# Patient Record
Sex: Female | Born: 1955 | Race: White | Hispanic: No | Marital: Married | Smoking: Current some day smoker
Health system: Southern US, Community
[De-identification: ages and names within clinical notes are randomized; demographics above are authoritative.]

## PROBLEM LIST (undated history)

## (undated) DIAGNOSIS — I639 Cerebral infarction, unspecified: Secondary | ICD-10-CM

## (undated) DIAGNOSIS — E039 Hypothyroidism, unspecified: Secondary | ICD-10-CM

## (undated) DIAGNOSIS — I472 Ventricular tachycardia, unspecified: Secondary | ICD-10-CM

## (undated) DIAGNOSIS — I10 Essential (primary) hypertension: Secondary | ICD-10-CM

## (undated) DIAGNOSIS — R9431 Abnormal electrocardiogram [ECG] [EKG]: Secondary | ICD-10-CM

## (undated) HISTORY — PX: SHOULDER ARTHROSCOPY W/ ROTATOR CUFF REPAIR: SHX2400

## (undated) HISTORY — PX: ORIF ANKLE FRACTURE: SHX5408

## (undated) HISTORY — PX: ABDOMINAL HYSTERECTOMY: SHX81

---

## 2000-03-11 ENCOUNTER — Other Ambulatory Visit: Admission: RE | Admit: 2000-03-11 | Discharge: 2000-03-11 | Payer: Self-pay | Admitting: Obstetrics and Gynecology

## 2000-03-12 ENCOUNTER — Other Ambulatory Visit: Admission: RE | Admit: 2000-03-12 | Discharge: 2000-03-12 | Payer: Self-pay | Admitting: Obstetrics and Gynecology

## 2000-04-28 ENCOUNTER — Ambulatory Visit (HOSPITAL_COMMUNITY): Admission: RE | Admit: 2000-04-28 | Discharge: 2000-04-28 | Payer: Self-pay | Admitting: Obstetrics and Gynecology

## 2000-04-28 ENCOUNTER — Encounter: Payer: Self-pay | Admitting: Obstetrics and Gynecology

## 2000-06-21 ENCOUNTER — Ambulatory Visit (HOSPITAL_BASED_OUTPATIENT_CLINIC_OR_DEPARTMENT_OTHER): Admission: RE | Admit: 2000-06-21 | Discharge: 2000-06-21 | Payer: Self-pay | Admitting: Otolaryngology

## 2001-05-11 ENCOUNTER — Observation Stay (HOSPITAL_COMMUNITY): Admission: RE | Admit: 2001-05-11 | Discharge: 2001-05-12 | Payer: Self-pay | Admitting: Obstetrics and Gynecology

## 2004-03-07 ENCOUNTER — Ambulatory Visit (HOSPITAL_COMMUNITY): Admission: RE | Admit: 2004-03-07 | Discharge: 2004-03-07 | Payer: Self-pay | Admitting: Urology

## 2006-06-27 ENCOUNTER — Emergency Department (HOSPITAL_COMMUNITY): Admission: EM | Admit: 2006-06-27 | Discharge: 2006-06-27 | Payer: Self-pay | Admitting: Emergency Medicine

## 2006-08-24 ENCOUNTER — Ambulatory Visit (HOSPITAL_COMMUNITY): Admission: RE | Admit: 2006-08-24 | Discharge: 2006-08-24 | Payer: Self-pay | Admitting: Urology

## 2006-09-30 IMAGING — CR DG ABDOMEN 1V
1 series · 1 of 1 positions shown · non-contrast
Comparison: none

HISTORY: Right renal pelvis calculus, 

ABDOMEN ONE VIEW:
Small bilateral renal calculi.
Additional larger calculus at right renal pelvis, 10 mm diameter.
No ureteral calcification.
Bones unremarkable.
Bowel gas pattern normal.

[view not recorded]
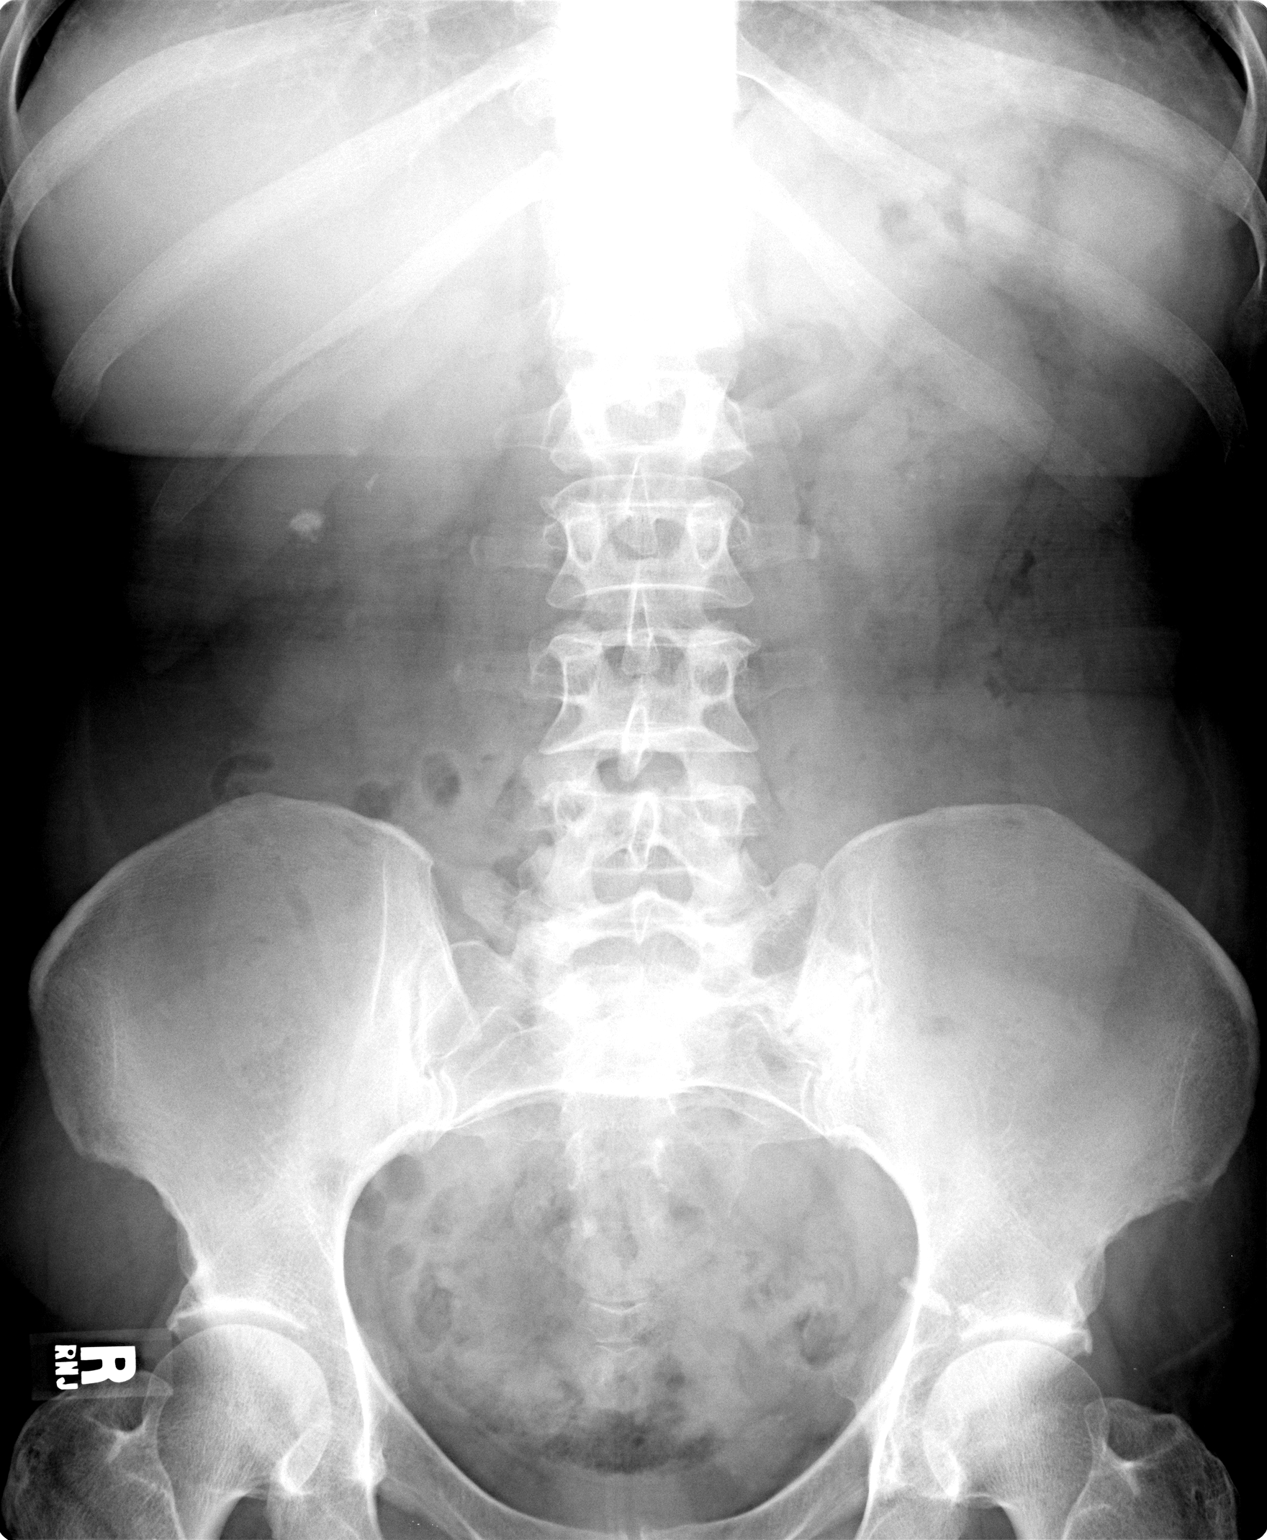

[1 of 1 positions shown; findings below may reference images not displayed]

IMPRESSION: Bilateral renal calculi, including 10 mm diameter calculus in the right kidney.

## 2009-03-18 IMAGING — CR DG ABDOMEN 1V
1 series · 1 of 1 positions shown · non-contrast
Comparison: 06/27/2006.

Examination: Abdomen, one view.

HISTORY: Left renal pelvis stone.

[t abdomen supine]
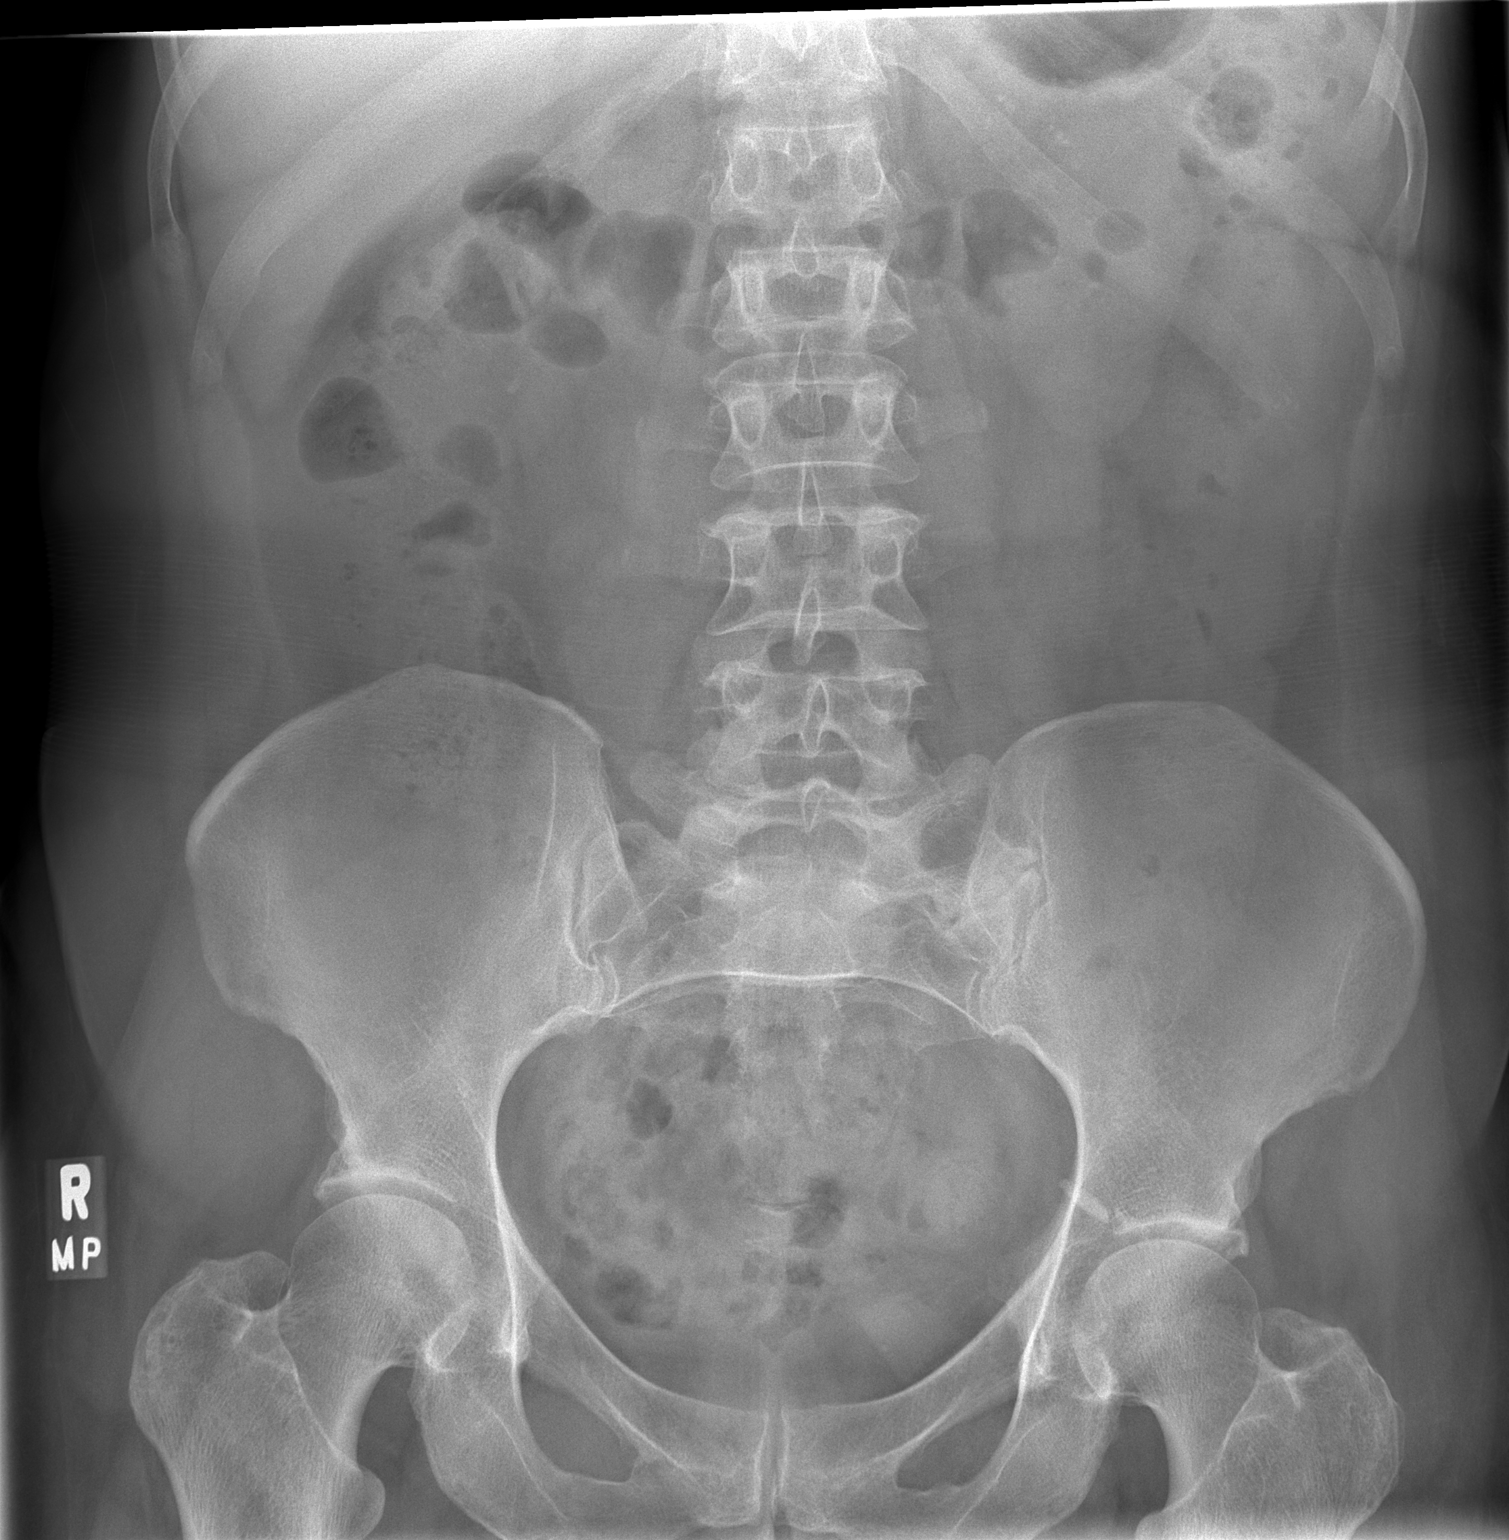

[1 of 1 positions shown; findings below may reference images not displayed]

FINDINGS: 4 mm stone is seen in the upper pole of the right kidney.

Multiple small stones are identified within the upper and lower pole collecting
systems of the left kidney.

Previously noted left proximal ureteral calculus is not seen on today's exam.

The bowel gas pattern is normal. 

No stones are seen the projection of the urinary bladder.
IMPRESSION: 1. Bilateral nephrolithiasis.

## 2011-05-13 DIAGNOSIS — C029 Malignant neoplasm of tongue, unspecified: Secondary | ICD-10-CM

## 2011-05-13 HISTORY — DX: Malignant neoplasm of tongue, unspecified: C02.9

## 2017-01-06 ENCOUNTER — Ambulatory Visit (INDEPENDENT_AMBULATORY_CARE_PROVIDER_SITE_OTHER): Payer: Self-pay | Admitting: Orthopaedic Surgery

## 2017-01-16 ENCOUNTER — Ambulatory Visit (INDEPENDENT_AMBULATORY_CARE_PROVIDER_SITE_OTHER): Payer: Self-pay | Admitting: Orthopaedic Surgery

## 2017-01-19 ENCOUNTER — Ambulatory Visit (INDEPENDENT_AMBULATORY_CARE_PROVIDER_SITE_OTHER): Payer: Self-pay | Admitting: Orthopaedic Surgery

## 2017-01-20 ENCOUNTER — Ambulatory Visit (INDEPENDENT_AMBULATORY_CARE_PROVIDER_SITE_OTHER): Payer: Self-pay | Admitting: Orthopaedic Surgery

## 2017-01-26 ENCOUNTER — Ambulatory Visit (INDEPENDENT_AMBULATORY_CARE_PROVIDER_SITE_OTHER): Payer: Self-pay | Admitting: Orthopaedic Surgery

## 2017-10-12 DIAGNOSIS — Z01818 Encounter for other preprocedural examination: Secondary | ICD-10-CM

## 2022-12-13 ENCOUNTER — Emergency Department (HOSPITAL_COMMUNITY): Payer: Medicare Other

## 2022-12-13 ENCOUNTER — Inpatient Hospital Stay (HOSPITAL_COMMUNITY)
Admission: EM | Admit: 2022-12-13 | Discharge: 2023-01-03 | DRG: 896 | Disposition: A | Discharge status: Home Health | Payer: Medicare Other | Attending: Internal Medicine | Admitting: Internal Medicine

## 2022-12-13 DIAGNOSIS — J9602 Acute respiratory failure with hypercapnia: Secondary | ICD-10-CM | POA: Diagnosis not present

## 2022-12-13 DIAGNOSIS — I952 Hypotension due to drugs: Secondary | ICD-10-CM | POA: Diagnosis not present

## 2022-12-13 DIAGNOSIS — R54 Age-related physical debility: Secondary | ICD-10-CM | POA: Diagnosis present

## 2022-12-13 DIAGNOSIS — Z8249 Family history of ischemic heart disease and other diseases of the circulatory system: Secondary | ICD-10-CM

## 2022-12-13 DIAGNOSIS — M069 Rheumatoid arthritis, unspecified: Secondary | ICD-10-CM | POA: Diagnosis present

## 2022-12-13 DIAGNOSIS — F32A Depression, unspecified: Secondary | ICD-10-CM | POA: Diagnosis present

## 2022-12-13 DIAGNOSIS — R6521 Severe sepsis with septic shock: Secondary | ICD-10-CM | POA: Diagnosis not present

## 2022-12-13 DIAGNOSIS — F05 Delirium due to known physiological condition: Secondary | ICD-10-CM | POA: Diagnosis not present

## 2022-12-13 DIAGNOSIS — D6489 Other specified anemias: Secondary | ICD-10-CM | POA: Diagnosis present

## 2022-12-13 DIAGNOSIS — F10139 Alcohol abuse with withdrawal, unspecified: Principal | ICD-10-CM | POA: Diagnosis present

## 2022-12-13 DIAGNOSIS — J440 Chronic obstructive pulmonary disease with acute lower respiratory infection: Secondary | ICD-10-CM | POA: Diagnosis present

## 2022-12-13 DIAGNOSIS — J9601 Acute respiratory failure with hypoxia: Secondary | ICD-10-CM | POA: Diagnosis not present

## 2022-12-13 DIAGNOSIS — A4101 Sepsis due to Methicillin susceptible Staphylococcus aureus: Secondary | ICD-10-CM | POA: Diagnosis not present

## 2022-12-13 DIAGNOSIS — T4275XA Adverse effect of unspecified antiepileptic and sedative-hypnotic drugs, initial encounter: Secondary | ICD-10-CM | POA: Diagnosis not present

## 2022-12-13 DIAGNOSIS — J690 Pneumonitis due to inhalation of food and vomit: Secondary | ICD-10-CM | POA: Diagnosis present

## 2022-12-13 DIAGNOSIS — E039 Hypothyroidism, unspecified: Secondary | ICD-10-CM | POA: Diagnosis present

## 2022-12-13 DIAGNOSIS — I4729 Other ventricular tachycardia: Secondary | ICD-10-CM | POA: Diagnosis present

## 2022-12-13 DIAGNOSIS — N170 Acute kidney failure with tubular necrosis: Secondary | ICD-10-CM | POA: Diagnosis not present

## 2022-12-13 DIAGNOSIS — Z781 Physical restraint status: Secondary | ICD-10-CM

## 2022-12-13 DIAGNOSIS — Z79899 Other long term (current) drug therapy: Secondary | ICD-10-CM

## 2022-12-13 DIAGNOSIS — J439 Emphysema, unspecified: Secondary | ICD-10-CM | POA: Diagnosis present

## 2022-12-13 DIAGNOSIS — E162 Hypoglycemia, unspecified: Secondary | ICD-10-CM | POA: Diagnosis present

## 2022-12-13 DIAGNOSIS — A059 Bacterial foodborne intoxication, unspecified: Secondary | ICD-10-CM | POA: Diagnosis present

## 2022-12-13 DIAGNOSIS — Z8674 Personal history of sudden cardiac arrest: Secondary | ICD-10-CM

## 2022-12-13 DIAGNOSIS — Z7982 Long term (current) use of aspirin: Secondary | ICD-10-CM

## 2022-12-13 DIAGNOSIS — D75839 Thrombocytosis, unspecified: Secondary | ICD-10-CM | POA: Diagnosis present

## 2022-12-13 DIAGNOSIS — E785 Hyperlipidemia, unspecified: Secondary | ICD-10-CM | POA: Diagnosis present

## 2022-12-13 DIAGNOSIS — F419 Anxiety disorder, unspecified: Secondary | ICD-10-CM | POA: Diagnosis present

## 2022-12-13 DIAGNOSIS — Z87442 Personal history of urinary calculi: Secondary | ICD-10-CM

## 2022-12-13 DIAGNOSIS — E874 Mixed disorder of acid-base balance: Secondary | ICD-10-CM | POA: Diagnosis present

## 2022-12-13 DIAGNOSIS — I129 Hypertensive chronic kidney disease with stage 1 through stage 4 chronic kidney disease, or unspecified chronic kidney disease: Secondary | ICD-10-CM | POA: Diagnosis present

## 2022-12-13 DIAGNOSIS — G40501 Epileptic seizures related to external causes, not intractable, with status epilepticus: Secondary | ICD-10-CM | POA: Diagnosis present

## 2022-12-13 DIAGNOSIS — K5900 Constipation, unspecified: Secondary | ICD-10-CM | POA: Diagnosis not present

## 2022-12-13 DIAGNOSIS — Z66 Do not resuscitate: Secondary | ICD-10-CM | POA: Diagnosis not present

## 2022-12-13 DIAGNOSIS — Z79890 Hormone replacement therapy (postmenopausal): Secondary | ICD-10-CM

## 2022-12-13 DIAGNOSIS — Z1152 Encounter for screening for COVID-19: Secondary | ICD-10-CM

## 2022-12-13 DIAGNOSIS — R4189 Other symptoms and signs involving cognitive functions and awareness: Secondary | ICD-10-CM

## 2022-12-13 DIAGNOSIS — G40901 Epilepsy, unspecified, not intractable, with status epilepticus: Principal | ICD-10-CM

## 2022-12-13 DIAGNOSIS — R569 Unspecified convulsions: Secondary | ICD-10-CM | POA: Diagnosis not present

## 2022-12-13 DIAGNOSIS — Z8049 Family history of malignant neoplasm of other genital organs: Secondary | ICD-10-CM

## 2022-12-13 DIAGNOSIS — E876 Hypokalemia: Secondary | ICD-10-CM | POA: Diagnosis present

## 2022-12-13 DIAGNOSIS — G9341 Metabolic encephalopathy: Secondary | ICD-10-CM | POA: Diagnosis not present

## 2022-12-13 DIAGNOSIS — J9811 Atelectasis: Secondary | ICD-10-CM | POA: Diagnosis present

## 2022-12-13 DIAGNOSIS — F10939 Alcohol use, unspecified with withdrawal, unspecified: Secondary | ICD-10-CM

## 2022-12-13 DIAGNOSIS — Z95810 Presence of automatic (implantable) cardiac defibrillator: Secondary | ICD-10-CM

## 2022-12-13 DIAGNOSIS — Y95 Nosocomial condition: Secondary | ICD-10-CM | POA: Diagnosis present

## 2022-12-13 DIAGNOSIS — Z90710 Acquired absence of both cervix and uterus: Secondary | ICD-10-CM

## 2022-12-13 DIAGNOSIS — Z823 Family history of stroke: Secondary | ICD-10-CM

## 2022-12-13 DIAGNOSIS — I69398 Other sequelae of cerebral infarction: Secondary | ICD-10-CM

## 2022-12-13 DIAGNOSIS — A4901 Methicillin susceptible Staphylococcus aureus infection, unspecified site: Secondary | ICD-10-CM | POA: Diagnosis not present

## 2022-12-13 DIAGNOSIS — G9349 Other encephalopathy: Secondary | ICD-10-CM | POA: Diagnosis present

## 2022-12-13 DIAGNOSIS — L89152 Pressure ulcer of sacral region, stage 2: Secondary | ICD-10-CM | POA: Diagnosis not present

## 2022-12-13 DIAGNOSIS — Z6822 Body mass index (BMI) 22.0-22.9, adult: Secondary | ICD-10-CM

## 2022-12-13 DIAGNOSIS — E43 Unspecified severe protein-calorie malnutrition: Secondary | ICD-10-CM | POA: Insufficient documentation

## 2022-12-13 DIAGNOSIS — Z9221 Personal history of antineoplastic chemotherapy: Secondary | ICD-10-CM

## 2022-12-13 DIAGNOSIS — Z923 Personal history of irradiation: Secondary | ICD-10-CM

## 2022-12-13 DIAGNOSIS — Z1624 Resistance to multiple antibiotics: Secondary | ICD-10-CM | POA: Diagnosis not present

## 2022-12-13 DIAGNOSIS — N1831 Chronic kidney disease, stage 3a: Secondary | ICD-10-CM | POA: Diagnosis present

## 2022-12-13 DIAGNOSIS — R1310 Dysphagia, unspecified: Secondary | ICD-10-CM | POA: Diagnosis present

## 2022-12-13 DIAGNOSIS — G930 Cerebral cysts: Secondary | ICD-10-CM | POA: Diagnosis present

## 2022-12-13 DIAGNOSIS — Z85810 Personal history of malignant neoplasm of tongue: Secondary | ICD-10-CM

## 2022-12-13 DIAGNOSIS — Z808 Family history of malignant neoplasm of other organs or systems: Secondary | ICD-10-CM

## 2022-12-13 DIAGNOSIS — F17210 Nicotine dependence, cigarettes, uncomplicated: Secondary | ICD-10-CM | POA: Diagnosis present

## 2022-12-13 HISTORY — DX: Cerebral infarction, unspecified: I63.9

## 2022-12-13 HISTORY — DX: Ventricular tachycardia, unspecified: I47.20

## 2022-12-13 HISTORY — DX: Abnormal electrocardiogram (ECG) (EKG): R94.31

## 2022-12-13 HISTORY — DX: Hypothyroidism, unspecified: E03.9

## 2022-12-13 HISTORY — DX: Essential (primary) hypertension: I10

## 2022-12-13 LAB — I-STAT CHEM 8, ED
BUN: 34 mg/dL — ABNORMAL HIGH (ref 8–23)
Calcium, Ion: 1.08 mmol/L — ABNORMAL LOW (ref 1.15–1.40)
Chloride: 105 mmol/L (ref 98–111)
Creatinine, Ser: 1.3 mg/dL — ABNORMAL HIGH (ref 0.44–1.00)
Glucose, Bld: 318 mg/dL — ABNORMAL HIGH (ref 70–99)
HCT: 50 % — ABNORMAL HIGH (ref 36.0–46.0)
Hemoglobin: 17 g/dL — ABNORMAL HIGH (ref 12.0–15.0)
Potassium: 5.5 mmol/L — ABNORMAL HIGH (ref 3.5–5.1)
Sodium: 134 mmol/L — ABNORMAL LOW (ref 135–145)
TCO2: 16 mmol/L — ABNORMAL LOW (ref 22–32)

## 2022-12-13 MED ORDER — SUCCINYLCHOLINE CHLORIDE 200 MG/10ML IV SOSY
100.0000 mg | PREFILLED_SYRINGE | Freq: Once | INTRAVENOUS | Status: AC
  Administered 2022-12-13: 100 mg via INTRAVENOUS

## 2022-12-13 MED ORDER — LEVETIRACETAM IN NACL 1500 MG/100ML IV SOLN
1500.0000 mg | INTRAVENOUS | Status: AC
  Administered 2022-12-13 – 2022-12-14 (×2): 1500 mg via INTRAVENOUS
  Filled 2022-12-13 (×2): qty 100

## 2022-12-13 MED ORDER — ETOMIDATE 2 MG/ML IV SOLN
INTRAVENOUS | Status: AC | PRN
  Administered 2022-12-13: 20 mg via INTRAVENOUS

## 2022-12-13 MED ORDER — PROPOFOL 1000 MG/100ML IV EMUL
INTRAVENOUS | Status: AC
  Administered 2022-12-13: 20 ug/kg/min via INTRAVENOUS
  Filled 2022-12-13: qty 100

## 2022-12-13 MED ORDER — SODIUM CHLORIDE 0.9 % IV SOLN
3000.0000 mg | Freq: Once | INTRAVENOUS | Status: DC
  Filled 2022-12-13: qty 30

## 2022-12-13 MED ORDER — SODIUM CHLORIDE 0.9 % IV BOLUS
1000.0000 mL | Freq: Once | INTRAVENOUS | Status: AC
  Administered 2022-12-13: 1000 mL via INTRAVENOUS

## 2022-12-13 MED ORDER — SODIUM CHLORIDE 0.9% FLUSH: 3.0000 mL | Freq: Once | INTRAVENOUS | Status: DC

## 2022-12-13 MED ORDER — PROPOFOL 1000 MG/100ML IV EMUL: 5.0000 ug/kg/min | INTRAVENOUS | Status: DC

## 2022-12-13 NOTE — ED Triage Notes (Signed)
Pt coming in with medic, for stroke like symptoms, pt had left sided gaze,

## 2022-12-13 NOTE — ED Provider Notes (Signed)
[facility]EMERGENCY DEPT [facility] Emergency Department Provider Note MRN:  [id number]  Arrival date & time: 12/14/22     Chief Complaint   Code Stroke   History of Present Illness   Krystal Peterson is a 67 y.o. year-old female with a history of stroke presenting to the ED with chief complaint of code stroke.  EMS called for unresponsiveness, on arrival patient had left gaze.  Seizure en route with EMS given midazolam.  Unresponsive on arrival requiring BVM.  Review of Systems  A thorough review of systems was obtained and all systems are negative except as noted in the HPI and PMH.   Patient's Health History   No past medical history on file.    No family history on file.  Social History   Socioeconomic History   Marital status: Married    Spouse name: Not on file   Number of children: Not on file   Years of education: Not on file   Highest education level: Not on file  Occupational History   Not on file  Tobacco Use   Smoking status: Not on file   Smokeless tobacco: Not on file  Substance and Sexual Activity   Alcohol use: Not on file   Drug use: Not on file   Sexual activity: Not on file  Other Topics Concern   Not on file  Social History Narrative   Not on file   Social Determinants of Health   Financial Resource Strain: Not on file  Food Insecurity: Low Risk  (11/14/2022)   Received from [facility]   Food vital sign    Within the past 12 months, you worried that your food would run out before you got money to buy more: Never true    Within the past 12 months, the food you bought just didn't last and you didn't have money to get more. : Never true  Transportation Needs: Not on file (11/14/2022)  Physical Activity: Not on file  Stress: Not on file  Social Connections: Not on file  Intimate Partner Violence: Not on file     Physical Exam   Vitals:   12/14/22 0515 12/14/22 0530  BP: 97/80 107/89  Pulse: 85 84  Resp: (!) 24 (!) 24  Temp:    SpO2:  100% 100%    CONSTITUTIONAL:  ill-appearing, NAD NEURO/PSYCH:  Unresponsive EYES:  eyes equal and reactive ENT/NECK:  no LAD, no JVD CARDIO:  tachycardic rate, well-perfused, normal S1 and S2 PULM:  CTAB no wheezing or rhonchi GI/GU:  non-distended, non-tender MSK/SPINE:  No gross deformities, no edema SKIN:  no rash, atraumatic   *Additional and/or pertinent findings included in MDM below  Diagnostic and Interventional Summary    EKG Interpretation Date/Time:    Ventricular Rate:    PR Interval:    QRS Duration:    QT Interval:    QTC Calculation:   R Axis:      Text Interpretation:         Labs Reviewed  CBC - Abnormal; Notable for the following components:      Result Value   WBC 18.7 (*)    Hemoglobin 16.0 (*)    HCT 47.9 (*)    MCV 104.1 (*)    MCH 34.8 (*)    RDW 17.1 (*)    All other components within normal limits  DIFFERENTIAL - Abnormal; Notable for the following components:   Neutro Abs 15.9 (*)    Monocytes Absolute 1.1 (*)  Abs Immature Granulocytes 0.24 (*)    All other components within normal limits  COMPREHENSIVE METABOLIC PANEL - Abnormal; Notable for the following components:   Potassium 3.1 (*)    CO2 19 (*)    Glucose, Bld 156 (*)    Creatinine, Ser 1.44 (*)    Total Protein 5.9 (*)    Albumin 3.1 (*)    GFR, Estimated 40 (*)    Anion gap 19 (*)    All other components within normal limits  CBC - Abnormal; Notable for the following components:   WBC 18.6 (*)    MCV 101.0 (*)    MCH 34.6 (*)    RDW 17.0 (*)    All other components within normal limits  BASIC METABOLIC PANEL - Abnormal; Notable for the following components:   Glucose, Bld 102 (*)    Creatinine, Ser 1.39 (*)    GFR, Estimated 42 (*)    All other components within normal limits  HEMOGLOBIN A1C - Abnormal; Notable for the following components:   Hgb A1c MFr Bld 4.6 (*)    All other components within normal limits  LACTIC ACID, PLASMA - Abnormal; Notable for the  following components:   Lactic Acid, Venous 3.4 (*)    All other components within normal limits  CK TOTAL AND CKMB (NOT AT [facility]) - Abnormal; Notable for the following components:   Total CK 257 (*)    CK, MB 13.0 (*)    All other components within normal limits  I-STAT CHEM 8, ED - Abnormal; Notable for the following components:   Sodium 134 (*)    Potassium 5.5 (*)    BUN 34 (*)    Creatinine, Ser 1.30 (*)    Glucose, Bld 318 (*)    Calcium, Ion 1.08 (*)    TCO2 16 (*)    Hemoglobin 17.0 (*)    HCT 50.0 (*)    All other components within normal limits  I-STAT ARTERIAL BLOOD GAS, ED - Abnormal; Notable for the following components:   pH, Arterial 7.150 (*)    pCO2 arterial 61.7 (*)    pO2, Arterial 435 (*)    Acid-base deficit 8.0 (*)    Potassium 3.2 (*)    All other components within normal limits  SARS CORONAVIRUS 2 BY RT PCR  MRSA NEXT GEN BY PCR, NASAL  PROTIME-INR  APTT  ETHANOL  MAGNESIUM  PHOSPHORUS  AMYLASE  LIPASE, BLOOD  PROTIME-INR  AMMONIA  GLUCOSE, CAPILLARY  HIV ANTIBODY (ROUTINE TESTING W REFLEX)  BLOOD GAS, ARTERIAL  PROCALCITONIN  CBG MONITORING, ED  TROPONIN I (HIGH SENSITIVITY)    CT CHEST WO CONTRAST  Final Result    US Abdomen Limited RUQ (LIVER/GB)  Final Result    DG Chest Port 1 View  Final Result    CT ANGIO HEAD NECK W WO CM W PERF (CODE STROKE)  Final Result    CT HEAD CODE STROKE WO CONTRAST  Final Result    DG Chest Port 1 View    (Results Pending)    Medications  sodium chloride flush (NS) 0.9 % injection 3 mL (0 mLs Intravenous Hold 12/13/22 2351)  midazolam (VERSED) 100 mg/100 mL (1 mg/mL) premix infusion (2 mg/hr Intravenous Infusion Verify 12/14/22 0500)  polyethylene glycol (MIRALAX / GLYCOLAX) packet 17 g (has no administration in time range)  heparin injection 5,000 Units (5,000 Units Subcutaneous Given 12/14/22 0245)  famotidine (PEPCID) tablet 20 mg (has no administration in time range)  pantoprazole (  PROTONIX)  injection 40 mg (40 mg Intravenous Given by Other 12/14/22 0238)  dextrose 5 % in lactated ringers infusion ( Intravenous Infusion Verify 12/14/22 0500)  insulin aspart (novoLOG) injection 0-9 Units ( Subcutaneous Not Given 12/14/22 0239)  docusate (COLACE) 50 MG/5ML liquid 100 mg (has no administration in time range)  fentaNYL (SUBLIMAZE) injection 25 mcg (has no administration in time range)  fentaNYL (SUBLIMAZE) injection 25-100 mcg (50 mcg Intravenous Given 12/14/22 0601)  midazolam (VERSED) injection 1-4 mg (has no administration in time range)  thiamine (VITAMIN B1) injection 100 mg (has no administration in time range)  folic acid injection 1 mg (has no administration in time range)  levETIRAcetam (KEPPRA) IVPB 500 mg/100 mL premix (has no administration in time range)  ipratropium-albuterol (DUONEB) 0.5-2.5 (3) MG/3ML nebulizer solution 3 mL (has no administration in time range)  Oral care mouth rinse (15 mLs Mouth Rinse Given 12/14/22 0404)  Oral care mouth rinse (has no administration in time range)  Chlorhexidine Gluconate Cloth 2 % PADS 6 each (has no administration in time range)  0.9 %  sodium chloride infusion (250 mLs Intravenous New Bag/Given 12/14/22 0604)  norepinephrine (LEVOPHED) 4mg  in (0.016 mg/mL) premix infusion (0 mcg/min Intravenous Stopped 12/14/22 0328)  lactated ringers bolus 1,000 mL (has no administration in time range)  etomidate (AMIDATE) injection (20 mg Intravenous Given 12/13/22 2345)  succinylcholine (ANECTINE) syringe 100 mg (100 mg Intravenous Given 12/13/22 2345)  sodium chloride 0.9 % bolus 1,000 mL (0 mLs Intravenous Stopped 12/14/22 0101)  levETIRAcetam (KEPPRA) IVPB 1500 mg/ 100 mL premix (0 mg Intravenous Stopped 12/14/22 0044)  iohexol (OMNIPAQUE) 350 MG/ML injection 100 mL (100 mLs Intravenous Contrast Given 12/14/22 0015)  potassium chloride (KLOR-CON) packet 40 mEq (40 mEq Oral Given 12/14/22 0245)  norepinephrine (LEVOPHED) 4-5 MG/250ML-% infusion SOLN (   Duplicate 12/14/22 0308)  lactated ringers bolus 1,000 mL ( Intravenous Stopped 12/14/22 0338)     Procedures  /  Critical Care .Critical Care  Performed by: Sabas Sous, MD Authorized by: Sabas Sous, MD   Critical care provider statement:    Critical care time (minutes):  45   Critical care was necessary to treat or prevent imminent or life-threatening deterioration of the following conditions:  CNS failure or compromise   Critical care was time spent personally by me on the following activities:  Development of treatment plan with patient or surrogate, discussions with consultants, evaluation of patient's response to treatment, examination of patient, ordering and review of laboratory studies, ordering and review of radiographic studies, ordering and performing treatments and interventions, pulse oximetry, re-evaluation of patient's condition and review of old charts   ED Course and Medical Decision Making  Initial Impression and Ddx Concern for acute ischemic or hemorrhagic stroke vs status epilepticus.  GCS 3 not protecting airway.  Intubated.  Past medical/surgical history that increases complexity of ED encounter: History of stroke  Interpretation of Diagnostics I personally reviewed the EKG and my interpretation is as follows: Sinus tachycardia  Labs reveal leukocytosis, mild acidosis.  Patient Reassessment and Ultimate Disposition/Management     Further history reveals the patient drinks daily and has been having some nausea vomiting recently and so possibly had a withdrawal seizure.  Had to seizures, 1 at home, 1 with EMS without return to baseline.  CT head without obvious hemorrhage.  Plan is for ICU admission.  Patient management required discussion with the following services or consulting groups:  Intensivist Service and Neurology  Complexity of  Problems Addressed Acute illness or injury that poses threat of life of bodily function  Additional Data Reviewed and  Analyzed Further history obtained from: EMS on arrival  Additional Factors Impacting ED Encounter Risk Consideration of hospitalization  Elmer Sow. Pilar Plate, MD [facility] Emergency Medicine [facility] [email or web address]  Final Clinical Impressions(s) / ED Diagnoses     ICD-10-CM   1. Status epilepticus (HCC)  G40.901     2. Unresponsiveness  R41.89     3. Alcohol withdrawal seizure with complication [facility] [place])  F10.939    R56.9       ED Discharge Orders     None        Discharge Instructions Discussed with and Provided to Patient:   Discharge Instructions   None      Sabas Sous, MD 12/14/22 [phone]

## 2022-12-13 NOTE — Consult Note (Incomplete)
NEUROLOGY CONSULTATION NOTE   Date of service: December 13, 2022 Patient Name: Krystal Peterson MRN:  [id number] DOB:  07/17/1955 Reason for consult: "Stroke code, " Requesting Provider: No att. providers found _ _ _   _ __   _ __ _ _  __ __   _ __   __ _  History of Present Illness  Krystal Peterson is a 67 y.o. 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  EMS report that patient was sitting with her husband when she had a seizure. She was post ictal when they got there. She had a seizure enroute with left gaze deviation and generalized tonic clonic seizure. She was unresponsive. She got Versed 5mg  once. Vital with glucose in 200s per EMS, tachycardic to 140s, BP of 180s systolic. She was being bagged on arrival and was unresponsive.  She was intubated upon arrival.   ROS   Constitutional Denies weight loss, fever and chills. ***  HEENT Denies changes in vision and hearing. ***  Respiratory Denies SOB and cough. ***  CV Denies palpitations and CP ***  GI Denies abdominal pain, nausea, vomiting and diarrhea. ***  GU Denies dysuria and urinary frequency. ***  MSK Denies myalgia and joint pain. ***  Skin Denies rash and pruritus. ***  Neurological Denies headache and syncope. ***  Psychiatric Denies recent changes in mood. Denies anxiety and depression. ***   Past History  No past medical history on file. *** The histories are not reviewed yet. Please review them in the "History" navigator section and refresh this SmartLink. No family history on file. Social History   Socioeconomic History  . Marital status: Married    Spouse name: Not on file  . Number of children: Not on file  . Years of education: Not on  file  . Highest education level: Not on file  Occupational History  . Not on file  Tobacco Use  . Smoking status: Not on file  . Smokeless tobacco: Not on file  Substance and Sexual Activity  . Alcohol use: Not on file  . Drug use: Not on file  . Sexual activity: Not on file  Other Topics Concern  . Not on file  Social History Narrative  . Not on file   Social Determinants of Health   Financial Resource Strain: Not on file  Food Insecurity: Low Risk  (11/14/2022)   Received from [facility]   Food vital sign   . Within the past 12 months, you worried that your food would run out before you got money to buy more: Never true   . Within the past 12 months, the food you bought just didn't last and you didn't have money to get more. : Never true  Transportation Needs: Not on file (11/14/2022)  Physical Activity: Not on file  Stress: Not on file  Social Connections: Not on file   Not on File  Medications  (Not in a hospital admission)    Vitals   Vitals:   12/13/22 2300  Weight: 54.4 kg  Height: 5\' 2"  (1.575 m)     Body mass index is 21.95 kg/m.  Physical Exam   General: Laying comfortably in bed; in no acute distress. ***  HENT: Normal oropharynx and mucosa. Normal external appearance of ears and nose. *** Neck: Supple, no pain or tenderness *** CV: No JVD. No peripheral edema. *** Pulmonary: Symmetric Chest rise. Normal respiratory effort. *** Abdomen: Soft to touch, non-tender. *** Ext: No cyanosis, edema, or deformity *** Skin: No rash. Normal palpation of skin.  *** Musculoskeletal: Normal digits and nails by inspection. No clubbing. ***  Neurologic Examination  Mental status/Cognition: Alert, oriented to self, place, month and year, good attention. *** Speech/language: Fluent, comprehension intact, object naming intact, repetition intact. *** Cranial nerves:   CN II Pupils equal and reactive to light, no VF deficits ***   CN III,IV,VI EOM intact, no gaze  preference or deviation, no nystagmus ***   CN V normal sensation in V1, V2, and V3 segments bilaterally ***   CN VII no asymmetry, no nasolabial fold flattening ***   CN VIII normal hearing to speech ***   CN IX & X normal palatal elevation, no uvular deviation ***   CN XI 5/5 head turn and 5/5 shoulder shrug bilaterally ***   CN XII midline tongue protrusion ***   Motor:  Muscle bulk: ***, tone ***, pronator drift *** tremor *** Mvmt Root Nerve  Muscle Right Left Comments  SA C5/6 Ax Deltoid     EF C5/6 Mc Biceps     EE C6/7/8 Rad Triceps     WF C6/7 Med FCR     WE C7/8 PIN ECU     F Ab C8/T1 U ADM/FDI     HF L1/2/3 Fem Illopsoas     KE L2/3/4 Fem Quad     DF L4/5 D Peron Tib Ant     PF S1/2 Tibial Grc/Sol      Reflexes:  Right Left Comments  Pectoralis      Biceps (C5/6)     Brachioradialis (C5/6)      Triceps (C6/7)      Patellar (L3/4)      Achilles (S1)      Hoffman      Plantar     Jaw jerk    Sensation:  Light touch    Pin prick    Temperature    Vibration   Proprioception    Coordination/Complex Motor:  - Finger to Nose *** - Heel to shin *** - Rapid alternating movement *** - Gait: Stride length ***. Arm swing ***. Base width ***  Labs   CBC: No results for input(s): "WBC", "NEUTROABS", "HGB", "HCT", "MCV", "PLT" in the last 168 hours.  Basic Metabolic Panel: No results found for: "NA", "K", "CO2", "GLUCOSE", "BUN", "CREATININE", "CALCIUM", "GFRNONAA", "GFRAA" Lipid Panel: No results found for: "LDLCALC" HgbA1c: No results found for: "HGBA1C" Urine Drug Screen: No results found for: "LABOPIA", "COCAINSCRNUR", "LABBENZ", "AMPHETMU", "THCU", "LABBARB"  Alcohol Level No results found for: "ETH"  CT Head without contrast(Personally reviewed): ***  CT angio Head and Neck with contrast(Personally reviewed): ***  MR Angio head without contrast and Carotid Duplex BL(Personally reviewed): ***  MRI Brain(Personally reviewed): ***  rEEG:   ***  Impression   Krystal Peterson is a 67 y.o. female with PMH significant for ***. Her neurologic examination is notable for ***.  Primary Diagnosis:  {Cerebral Infarction:22351}  Secondary Diagnosis: {Stroke Comorbidities:21266}  Recommendations  *** ______________________________________________________________________   Thank you for the opportunity to take part in the care of this patient. If you have any further questions, please contact the neurology consultation attending.  Signed,  Erick Blinks [facility] _  _ _   _ __   _ __ _ _  __ __   _ __   __ _  

## 2022-12-13 NOTE — Consult Note (Signed)
NEUROLOGY CONSULTATION NOTE   Date of service: December 13, 2022 Patient Name: Krystal Peterson MRN:  [id number] DOB:  August 08, 1955 Reason for consult: "Stroke code, " Requesting Provider: No att. providers found _ _ _   _ __   _ __ _ _  __ __   _ __   __ _  History of Present Illness  Krystal Peterson is a 67 y.o. 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  EMS report that patient was sitting with her husband when she had a seizure. She was post ictal when they got there. She had a seizure enroute with left gaze deviation and generalized tonic clonic seizure. She was unresponsive. She got Versed 5mg  once. Vital with glucose in 200s per EMS, tachycardic to 140s, BP of 180s systolic. She was being bagged on arrival and was unresponsive.  She was intubated upon arrival.  Upon further questioning, husband reports that patient drinks 5 beers a day, smokes half a pack a day. She has had gastroenteritis like symptoms over the last day and has been throwing up and having diarrhea. She has therefore, not been able to tolerate much alcohol. This evening, they were sitting and watching TV when she yelled out, eyes to the left, seizure for 3 mins and then unresponsive.  LKW: 2230 on 12/13/2022. mRS: 0 tNKASE: Not offered due to low suspicion for stroke. Thrombectomy: Not offered due to low suspicion for stroke. NIHSS components Score: Comment  1a Level of Conscious 0[]  1[]  2[]  3[x]      1b LOC Questions 0[]  1[]  2[x]       1c LOC Commands 0[]  1[]  2[x]       2 Best Gaze 0[]  1[]  2[x]     Intermittent left gaze  3 Visual 0[x]  1[]  2[]  3[]      4 Facial Palsy 0[x]  1[]  2[]  3[]      5a Motor Arm - left 0[]  1[]  2[]  3[]  4[x]  UN[]    5b Motor Arm -  Right 0[]  1[]  2[]  3[]  4[x]  UN[]    6a Motor Leg - Left 0[]  1[]  2[]  3[]  4[x]  UN[]    6b Motor Leg - Right 0[]  1[]  2[]  3[]  4[x]  UN[]    7 Limb Ataxia 0[x]  1[]  2[]  3[]  UN[]     8 Sensory 0[]  1[]  2[x]  UN[]      9 Best Language 0[]  1[]  2[]  3[x]      10 Dysarthria 0[]  1[]  2[x]  UN[]      11 Extinct. and Inattention 0[x]  1[]  2[]       TOTAL: 32      ROS   Unable to obtain due to review of systems secondary to obtunded mentation and intubation.  Past History  No past medical history on file.  No family history on file. Social History   Socioeconomic History   Marital status: Married    Spouse name: Not on file   Number of children: Not on file   Years of education: Not on file   Highest education level: Not on file  Occupational History   Not on file  Tobacco Use   Smoking status: Not on file   Smokeless tobacco: Not on file  Substance and Sexual Activity   Alcohol use: Not on file   Drug use: Not on  file   Sexual activity: Not on file  Other Topics Concern   Not on file  Social History Narrative   Not on file   Social Determinants of Health   Financial Resource Strain: Not on file  Food Insecurity: Low Risk  (11/14/2022)   Received from [facility]   Food vital sign    Within the past 12 months, you worried that your food would run out before you got money to buy more: Never true    Within the past 12 months, the food you bought just didn't last and you didn't have money to get more. : Never true  Transportation Needs: Not on file (11/14/2022)  Physical Activity: Not on file  Stress: Not on file  Social Connections: Not on file   Not on File  Medications  (Not in a hospital admission)    Vitals   Vitals:   12/13/22 2300  Weight: 54.4 kg  Height: 5\' 2"  (1.575 m)     Body mass index is 21.95 kg/m.  Physical Exam   General: Laying comfortably in bed; intubated. HENT: Normal oropharynx and mucosa. Normal external appearance of ears and nose.  Neck: Supple, no pain  or tenderness  CV: No JVD. No peripheral edema.  Pulmonary: Symmetric Chest rise. Normal respiratory effort.  Abdomen: Soft to touch, non-tender.  Ext: No cyanosis, edema, or deformity  Skin: No rash. Normal palpation of skin.   Musculoskeletal: Normal digits and nails by inspection. No clubbing.   Neurologic Examination  Mental status/Cognition: No response to voice or loud clap.  No response to nares stimulation. Speech/language: Mute, no speech, no attempts to communicate.   Cranial nerves:   CN II Pupils equal and reactive to light, intermittently having brief periods of left gaze deviation.   CN III,IV,VI EOMI intact to doll's eye.   CN V Corneals intact   CN VII Facial diplegia   CN VIII Does not make eye contact to speech.   CN IX & X No gag, getting ready to be intubated.   CN XI Head is midline.   CN XII Tongue appears midline, would not protrude tongue on command.   Sensory/motor:  Muscle bulk: Poor, tone flaccid in all extremities. No movement noted in any of the extremities to noxious stimuli.  Coordination/Complex Motor:  Unable to assess coordination. Labs   CBC: No results for input(s): "WBC", "NEUTROABS", "HGB", "HCT", "MCV", "PLT" in the last 168 hours.  Basic Metabolic Panel: No results found for: "NA", "K", "CO2", "GLUCOSE", "BUN", "CREATININE", "CALCIUM", "GFRNONAA", "GFRAA" Lipid Panel: No results found for: "LDLCALC" HgbA1c: No results found for: "HGBA1C" Urine Drug Screen: No results found for: "LABOPIA", "COCAINSCRNUR", "LABBENZ", "AMPHETMU", "THCU", "LABBARB"  Alcohol Level No results found for: "ETH"  CT Head without contrast(Personally reviewed): CTH was negative for a large hypodensity concerning for a large territory infarct or hyperdensity concerning for an ICH  CT angio Head and Neck with contrast(Personally reviewed): No LVO  MRI Brain(Personally reviewed): Pending, unable to get overnight due to ICD.  cEEG:  pending  Impression    Krystal Peterson is a 67 y.o. 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  Further discussion with family revealed that patient rates about 5  beers a day, has not been able to tolerate any alcohol intake due to nausea/vomiting/diarrhea for the last day.  Suspect the likely etiology of her seizures are probably described as she had an old left PCA infarct along with her abrupt alcohol withdrawal in the setting of gastroenteritis-like symptoms.  Recommendations  -Keppra load of 60 mg electrogram IV once.  Will start Keppra 500 twice daily -Continue Versed for sedation at this time.  Will titrate based on LTM. -LTM EEG. - MRI brain without contrast in AM. ______________________________________________________________________  This patient is critically ill and at significant risk of neurological worsening, death and care requires constant monitoring of vital signs, hemodynamics,respiratory and cardiac monitoring, neurological assessment, discussion with family, other specialists and medical decision making of high complexity. I spent 90 minutes of neurocritical care time  in the care of  this patient. This was time spent independent of any time provided by nurse practitioner or PA.  Erick Blinks [facility] 12/14/2022  2:18 AM   Thank you for the opportunity to take part in the care of this patient. If you have any further questions, please contact the neurology consultation attending.  Signed,  Erick Blinks [facility] _ _ _   _ __   _ __ _ _  __ __   _ __   __ _

## 2022-12-13 NOTE — ED Notes (Signed)
Medic vitals   180/110  142hr 244bgl 100% SpO2 on BVM

## 2022-12-14 ENCOUNTER — Emergency Department (HOSPITAL_COMMUNITY): Payer: Medicare Other

## 2022-12-14 ENCOUNTER — Inpatient Hospital Stay (HOSPITAL_COMMUNITY): Payer: Medicare Other

## 2022-12-14 DIAGNOSIS — G9349 Other encephalopathy: Secondary | ICD-10-CM

## 2022-12-14 DIAGNOSIS — J440 Chronic obstructive pulmonary disease with acute lower respiratory infection: Secondary | ICD-10-CM | POA: Diagnosis present

## 2022-12-14 DIAGNOSIS — A419 Sepsis, unspecified organism: Secondary | ICD-10-CM | POA: Diagnosis not present

## 2022-12-14 DIAGNOSIS — E43 Unspecified severe protein-calorie malnutrition: Secondary | ICD-10-CM | POA: Diagnosis present

## 2022-12-14 DIAGNOSIS — N170 Acute kidney failure with tubular necrosis: Secondary | ICD-10-CM | POA: Diagnosis not present

## 2022-12-14 DIAGNOSIS — F32A Depression, unspecified: Secondary | ICD-10-CM | POA: Diagnosis present

## 2022-12-14 DIAGNOSIS — R569 Unspecified convulsions: Secondary | ICD-10-CM | POA: Diagnosis not present

## 2022-12-14 DIAGNOSIS — G9341 Metabolic encephalopathy: Secondary | ICD-10-CM | POA: Diagnosis not present

## 2022-12-14 DIAGNOSIS — Z66 Do not resuscitate: Secondary | ICD-10-CM | POA: Diagnosis not present

## 2022-12-14 DIAGNOSIS — I129 Hypertensive chronic kidney disease with stage 1 through stage 4 chronic kidney disease, or unspecified chronic kidney disease: Secondary | ICD-10-CM | POA: Diagnosis present

## 2022-12-14 DIAGNOSIS — E874 Mixed disorder of acid-base balance: Secondary | ICD-10-CM | POA: Diagnosis present

## 2022-12-14 DIAGNOSIS — I4729 Other ventricular tachycardia: Secondary | ICD-10-CM | POA: Diagnosis present

## 2022-12-14 DIAGNOSIS — M069 Rheumatoid arthritis, unspecified: Secondary | ICD-10-CM | POA: Diagnosis present

## 2022-12-14 DIAGNOSIS — D75839 Thrombocytosis, unspecified: Secondary | ICD-10-CM | POA: Diagnosis not present

## 2022-12-14 DIAGNOSIS — G40501 Epileptic seizures related to external causes, not intractable, with status epilepticus: Secondary | ICD-10-CM | POA: Diagnosis present

## 2022-12-14 DIAGNOSIS — J439 Emphysema, unspecified: Secondary | ICD-10-CM | POA: Diagnosis present

## 2022-12-14 DIAGNOSIS — F17210 Nicotine dependence, cigarettes, uncomplicated: Secondary | ICD-10-CM | POA: Diagnosis not present

## 2022-12-14 DIAGNOSIS — N1831 Chronic kidney disease, stage 3a: Secondary | ICD-10-CM | POA: Diagnosis present

## 2022-12-14 DIAGNOSIS — I428 Other cardiomyopathies: Secondary | ICD-10-CM | POA: Diagnosis not present

## 2022-12-14 DIAGNOSIS — F10139 Alcohol abuse with withdrawal, unspecified: Secondary | ICD-10-CM | POA: Diagnosis present

## 2022-12-14 DIAGNOSIS — G40901 Epilepsy, unspecified, not intractable, with status epilepticus: Secondary | ICD-10-CM | POA: Diagnosis not present

## 2022-12-14 DIAGNOSIS — J15211 Pneumonia due to Methicillin susceptible Staphylococcus aureus: Secondary | ICD-10-CM | POA: Diagnosis not present

## 2022-12-14 DIAGNOSIS — E039 Hypothyroidism, unspecified: Secondary | ICD-10-CM | POA: Diagnosis present

## 2022-12-14 DIAGNOSIS — F1090 Alcohol use, unspecified, uncomplicated: Secondary | ICD-10-CM | POA: Diagnosis not present

## 2022-12-14 DIAGNOSIS — Z1152 Encounter for screening for COVID-19: Secondary | ICD-10-CM | POA: Diagnosis not present

## 2022-12-14 DIAGNOSIS — Y95 Nosocomial condition: Secondary | ICD-10-CM | POA: Diagnosis present

## 2022-12-14 DIAGNOSIS — J690 Pneumonitis due to inhalation of food and vomit: Secondary | ICD-10-CM | POA: Diagnosis present

## 2022-12-14 DIAGNOSIS — I69398 Other sequelae of cerebral infarction: Secondary | ICD-10-CM | POA: Diagnosis not present

## 2022-12-14 DIAGNOSIS — R4182 Altered mental status, unspecified: Secondary | ICD-10-CM | POA: Diagnosis not present

## 2022-12-14 DIAGNOSIS — A4101 Sepsis due to Methicillin susceptible Staphylococcus aureus: Secondary | ICD-10-CM | POA: Diagnosis not present

## 2022-12-14 DIAGNOSIS — Z9911 Dependence on respirator [ventilator] status: Secondary | ICD-10-CM | POA: Diagnosis not present

## 2022-12-14 DIAGNOSIS — R6521 Severe sepsis with septic shock: Secondary | ICD-10-CM | POA: Diagnosis not present

## 2022-12-14 DIAGNOSIS — F10939 Alcohol use, unspecified with withdrawal, unspecified: Secondary | ICD-10-CM | POA: Diagnosis not present

## 2022-12-14 DIAGNOSIS — J9601 Acute respiratory failure with hypoxia: Secondary | ICD-10-CM | POA: Diagnosis present

## 2022-12-14 DIAGNOSIS — J9602 Acute respiratory failure with hypercapnia: Secondary | ICD-10-CM | POA: Diagnosis not present

## 2022-12-14 LAB — CK TOTAL AND CKMB (NOT AT ARMC)
CK, MB: 13 ng/mL — ABNORMAL HIGH (ref 0.5–5.0)
Total CK: 257 U/L — ABNORMAL HIGH (ref 38–234)

## 2022-12-14 LAB — CBC
HCT: 42 % (ref 36.0–46.0)
HCT: 47.9 % — ABNORMAL HIGH (ref 36.0–46.0)
Hemoglobin: 14.4 g/dL (ref 12.0–15.0)
Hemoglobin: 16 g/dL — ABNORMAL HIGH (ref 12.0–15.0)
MCH: 34.6 pg — ABNORMAL HIGH (ref 26.0–34.0)
MCH: 34.8 pg — ABNORMAL HIGH (ref 26.0–34.0)
MCHC: 33.4 g/dL (ref 30.0–36.0)
MCHC: 34.3 g/dL (ref 30.0–36.0)
MCV: 101 fL — ABNORMAL HIGH (ref 80.0–100.0)
MCV: 104.1 fL — ABNORMAL HIGH (ref 80.0–100.0)
Platelets: 294 10*3/uL (ref 150–400)
Platelets: 384 10*3/uL (ref 150–400)
RBC: 4.16 MIL/uL (ref 3.87–5.11)
RBC: 4.6 MIL/uL (ref 3.87–5.11)
RDW: 17 % — ABNORMAL HIGH (ref 11.5–15.5)
RDW: 17.1 % — ABNORMAL HIGH (ref 11.5–15.5)
WBC: 18.6 10*3/uL — ABNORMAL HIGH (ref 4.0–10.5)
WBC: 18.7 10*3/uL — ABNORMAL HIGH (ref 4.0–10.5)
nRBC: 0 % (ref 0.0–0.2)
nRBC: 0 % (ref 0.0–0.2)

## 2022-12-14 LAB — POCT I-STAT 7, (LYTES, BLD GAS, ICA,H+H)
Acid-base deficit: 2 mmol/L (ref 0.0–2.0)
Bicarbonate: 21 mmol/L (ref 20.0–28.0)
Calcium, Ion: 1.19 mmol/L (ref 1.15–1.40)
HCT: 37 % (ref 36.0–46.0)
Hemoglobin: 12.6 g/dL (ref 12.0–15.0)
O2 Saturation: 100 %
Patient temperature: 98.9
Potassium: 4.3 mmol/L (ref 3.5–5.1)
Sodium: 137 mmol/L (ref 135–145)
TCO2: 22 mmol/L (ref 22–32)
pCO2 arterial: 32 mmHg (ref 32–48)
pH, Arterial: 7.427 (ref 7.35–7.45)
pO2, Arterial: 173 mmHg — ABNORMAL HIGH (ref 83–108)

## 2022-12-14 LAB — GLUCOSE, CAPILLARY
Glucose-Capillary: 123 mg/dL — ABNORMAL HIGH (ref 70–99)
Glucose-Capillary: 67 mg/dL — ABNORMAL LOW (ref 70–99)
Glucose-Capillary: 69 mg/dL — ABNORMAL LOW (ref 70–99)
Glucose-Capillary: 69 mg/dL — ABNORMAL LOW (ref 70–99)
Glucose-Capillary: 69 mg/dL — ABNORMAL LOW (ref 70–99)
Glucose-Capillary: 74 mg/dL (ref 70–99)
Glucose-Capillary: 75 mg/dL (ref 70–99)
Glucose-Capillary: 82 mg/dL (ref 70–99)
Glucose-Capillary: 91 mg/dL (ref 70–99)
Glucose-Capillary: 92 mg/dL (ref 70–99)

## 2022-12-14 LAB — PROCALCITONIN: Procalcitonin: 8.97 ng/mL

## 2022-12-14 LAB — BASIC METABOLIC PANEL
Anion gap: 10 (ref 5–15)
BUN: 20 mg/dL (ref 8–23)
CO2: 23 mmol/L (ref 22–32)
Calcium: 8.9 mg/dL (ref 8.9–10.3)
Chloride: 104 mmol/L (ref 98–111)
Creatinine, Ser: 1.39 mg/dL — ABNORMAL HIGH (ref 0.44–1.00)
GFR, Estimated: 42 mL/min — ABNORMAL LOW (ref >60)
Glucose, Bld: 102 mg/dL — ABNORMAL HIGH (ref 70–99)
Potassium: 4.2 mmol/L (ref 3.5–5.1)
Sodium: 137 mmol/L (ref 135–145)

## 2022-12-14 LAB — DIFFERENTIAL
Abs Immature Granulocytes: 0.24 10*3/uL — ABNORMAL HIGH (ref 0.00–0.07)
Basophils Absolute: 0 10*3/uL (ref 0.0–0.1)
Basophils Relative: 0 %
Eosinophils Absolute: 0 10*3/uL (ref 0.0–0.5)
Eosinophils Relative: 0 %
Immature Granulocytes: 1 %
Lymphocytes Relative: 7 %
Lymphs Abs: 1.4 10*3/uL (ref 0.7–4.0)
Monocytes Absolute: 1.1 10*3/uL — ABNORMAL HIGH (ref 0.1–1.0)
Monocytes Relative: 6 %
Neutro Abs: 15.9 10*3/uL — ABNORMAL HIGH (ref 1.7–7.7)
Neutrophils Relative %: 86 %

## 2022-12-14 LAB — MAGNESIUM
Magnesium: 2 mg/dL (ref 1.7–2.4)
Magnesium: 1.9 mg/dL (ref 1.7–2.4)
Magnesium: 1.7 mg/dL (ref 1.7–2.4)

## 2022-12-14 LAB — AMMONIA: Ammonia: 25 umol/L (ref 9–35)

## 2022-12-14 LAB — I-STAT ARTERIAL BLOOD GAS, ED
Acid-base deficit: 8 mmol/L — ABNORMAL HIGH (ref 0.0–2.0)
Bicarbonate: 21.6 mmol/L (ref 20.0–28.0)
Calcium, Ion: 1.28 mmol/L (ref 1.15–1.40)
HCT: 39 % (ref 36.0–46.0)
Hemoglobin: 13.3 g/dL (ref 12.0–15.0)
O2 Saturation: 100 %
Patient temperature: 98
Potassium: 3.2 mmol/L — ABNORMAL LOW (ref 3.5–5.1)
Sodium: 138 mmol/L (ref 135–145)
TCO2: 23 mmol/L (ref 22–32)
pCO2 arterial: 61.7 mmHg — ABNORMAL HIGH (ref 32–48)
pH, Arterial: 7.15 — CL (ref 7.35–7.45)
pO2, Arterial: 435 mmHg — ABNORMAL HIGH (ref 83–108)

## 2022-12-14 LAB — COMPREHENSIVE METABOLIC PANEL
ALT: 25 U/L (ref 0–44)
AST: 31 U/L (ref 15–41)
Albumin: 3.1 g/dL — ABNORMAL LOW (ref 3.5–5.0)
Alkaline Phosphatase: 59 U/L (ref 38–126)
Anion gap: 19 — ABNORMAL HIGH (ref 5–15)
BUN: 22 mg/dL (ref 8–23)
CO2: 19 mmol/L — ABNORMAL LOW (ref 22–32)
Calcium: 9 mg/dL (ref 8.9–10.3)
Chloride: 101 mmol/L (ref 98–111)
Creatinine, Ser: 1.44 mg/dL — ABNORMAL HIGH (ref 0.44–1.00)
GFR, Estimated: 40 mL/min — ABNORMAL LOW (ref >60)
Glucose, Bld: 156 mg/dL — ABNORMAL HIGH (ref 70–99)
Potassium: 3.1 mmol/L — ABNORMAL LOW (ref 3.5–5.1)
Sodium: 139 mmol/L (ref 135–145)
Total Bilirubin: 0.5 mg/dL (ref 0.3–1.2)
Total Protein: 5.9 g/dL — ABNORMAL LOW (ref 6.5–8.1)

## 2022-12-14 LAB — SARS CORONAVIRUS 2 BY RT PCR: SARS Coronavirus 2 by RT PCR: NEGATIVE

## 2022-12-14 LAB — PROTIME-INR
INR: 1 (ref 0.8–1.2)
INR: 1.1 (ref 0.8–1.2)
Prothrombin Time: 13.6 seconds (ref 11.4–15.2)
Prothrombin Time: 14.8 s (ref 11.4–15.2)

## 2022-12-14 LAB — HIV ANTIBODY (ROUTINE TESTING W REFLEX): HIV Screen 4th Generation wRfx: NONREACTIVE

## 2022-12-14 LAB — PHOSPHORUS
Phosphorus: 3.9 mg/dL (ref 2.5–4.6)
Phosphorus: 2.6 mg/dL (ref 2.5–4.6)
Phosphorus: 2.2 mg/dL — ABNORMAL LOW (ref 2.5–4.6)

## 2022-12-14 LAB — LACTIC ACID, PLASMA: Lactic Acid, Venous: 3.4 mmol/L (ref 0.5–1.9)

## 2022-12-14 LAB — ETHANOL: Alcohol, Ethyl (B): <10 mg/dL (ref <10)

## 2022-12-14 LAB — HEMOGLOBIN A1C
Hgb A1c MFr Bld: 4.6 % — ABNORMAL LOW (ref 4.8–5.6)
Mean Plasma Glucose: 85.32 mg/dL

## 2022-12-14 LAB — AMYLASE: Amylase: 28 U/L (ref 28–100)

## 2022-12-14 LAB — LIPASE, BLOOD: Lipase: 28 U/L (ref 11–51)

## 2022-12-14 LAB — APTT: aPTT: 31 s (ref 24–36)

## 2022-12-14 LAB — TROPONIN I (HIGH SENSITIVITY): Troponin I (High Sensitivity): 532 ng/L (ref <18)

## 2022-12-14 LAB — MRSA NEXT GEN BY PCR, NASAL: MRSA by PCR Next Gen: NOT DETECTED

## 2022-12-14 MED ORDER — VITAL AF 1.2 CAL PO LIQD
1000.0000 mL | ORAL | Status: DC
  Administered 2022-12-14: 1000 mL

## 2022-12-14 MED ORDER — LEVETIRACETAM IN NACL 500 MG/100ML IV SOLN
500.0000 mg | Freq: Two times a day (BID) | INTRAVENOUS | Status: DC
  Administered 2022-12-14 – 2022-12-15 (×3): 500 mg via INTRAVENOUS
  Filled 2022-12-14 (×3): qty 100

## 2022-12-14 MED ORDER — POLYETHYLENE GLYCOL 3350 17 G PO PACK: 17.0000 g | PACK | Freq: Every day | ORAL | Status: DC | PRN

## 2022-12-14 MED ORDER — PROPOFOL 1000 MG/100ML IV EMUL: 0.0000 ug/kg/min | INTRAVENOUS | Status: DC

## 2022-12-14 MED ORDER — THIAMINE HCL 100 MG/ML IJ SOLN
100.0000 mg | Freq: Every day | INTRAMUSCULAR | Status: DC
  Administered 2022-12-14: 100 mg via INTRAVENOUS
  Filled 2022-12-14: qty 2

## 2022-12-14 MED ORDER — FENTANYL CITRATE PF 50 MCG/ML IJ SOSY
25.0000 ug | PREFILLED_SYRINGE | INTRAMUSCULAR | Status: DC | PRN
  Administered 2022-12-14: 50 ug via INTRAVENOUS
  Administered 2022-12-14 (×3): 100 ug via INTRAVENOUS
  Administered 2022-12-14: 50 ug via INTRAVENOUS
  Administered 2022-12-14 (×2): 75 ug via INTRAVENOUS
  Administered 2022-12-15 (×2): 50 ug via INTRAVENOUS
  Filled 2022-12-14 (×6): qty 2

## 2022-12-14 MED ORDER — HEPARIN SODIUM (PORCINE) 5000 UNIT/ML IJ SOLN
5000.0000 [IU] | Freq: Three times a day (TID) | INTRAMUSCULAR | Status: DC
  Administered 2022-12-14 – 2022-12-17 (×10): 5000 [IU] via SUBCUTANEOUS
  Filled 2022-12-14 (×10): qty 1

## 2022-12-14 MED ORDER — FENTANYL 2500MCG IN NS 250ML (10MCG/ML) PREMIX INFUSION
0.0000 ug/h | INTRAVENOUS | Status: DC
  Administered 2022-12-14: 25 ug/h via INTRAVENOUS
  Filled 2022-12-14: qty 250

## 2022-12-14 MED ORDER — ORAL CARE MOUTH RINSE: 15.0000 mL | OROMUCOSAL | Status: DC | PRN

## 2022-12-14 MED ORDER — LIDOCAINE 5 % EX PTCH
2.0000 | MEDICATED_PATCH | CUTANEOUS | Status: DC
  Administered 2022-12-14 – 2023-01-02 (×19): 2 via TRANSDERMAL
  Filled 2022-12-14 (×18): qty 2

## 2022-12-14 MED ORDER — FOLIC ACID 5 MG/ML IJ SOLN
1.0000 mg | Freq: Every day | INTRAMUSCULAR | Status: DC
  Filled 2022-12-14 (×3): qty 0.2

## 2022-12-14 MED ORDER — POLYETHYLENE GLYCOL 3350 17 G PO PACK: 17.0000 g | PACK | Freq: Every day | ORAL | Status: DC

## 2022-12-14 MED ORDER — CHLORHEXIDINE GLUCONATE CLOTH 2 % EX PADS
6.0000 | MEDICATED_PAD | Freq: Every day | CUTANEOUS | Status: DC
  Administered 2022-12-14 – 2023-01-02 (×18): 6 via TOPICAL

## 2022-12-14 MED ORDER — IOHEXOL 350 MG/ML SOLN
100.0000 mL | Freq: Once | INTRAVENOUS | Status: AC | PRN
  Administered 2022-12-14: 100 mL via INTRAVENOUS

## 2022-12-14 MED ORDER — NOREPINEPHRINE 4 MG/250ML-% IV SOLN
2.0000 ug/min | INTRAVENOUS | Status: DC
  Administered 2022-12-14: 5 ug/min via INTRAVENOUS

## 2022-12-14 MED ORDER — MIDAZOLAM-SODIUM CHLORIDE 100-0.9 MG/100ML-% IV SOLN
0.5000 mg/h | INTRAVENOUS | Status: DC
  Administered 2022-12-14: 1 mg/h via INTRAVENOUS
  Administered 2022-12-15: 2 mg/h via INTRAVENOUS
  Filled 2022-12-14 (×2): qty 100

## 2022-12-14 MED ORDER — POTASSIUM & SODIUM PHOSPHATES 280-160-250 MG PO PACK
2.0000 | PACK | Freq: Once | ORAL | Status: AC
  Administered 2022-12-14: 2
  Filled 2022-12-14: qty 2

## 2022-12-14 MED ORDER — MIDAZOLAM HCL 2 MG/2ML IJ SOLN
1.0000 mg | INTRAMUSCULAR | Status: DC | PRN
  Administered 2022-12-15 (×2): 1 mg via INTRAVENOUS

## 2022-12-14 MED ORDER — DEXTROSE 50 % IV SOLN
12.5000 g | INTRAVENOUS | Status: AC
  Administered 2022-12-14: 12.5 g via INTRAVENOUS

## 2022-12-14 MED ORDER — DOCUSATE SODIUM 100 MG PO CAPS: 100.0000 mg | ORAL_CAPSULE | Freq: Two times a day (BID) | ORAL | Status: DC | PRN

## 2022-12-14 MED ORDER — LEVOTHYROXINE SODIUM 100 MCG PO TABS
100.0000 ug | ORAL_TABLET | Freq: Every day | ORAL | Status: DC
  Administered 2022-12-15 – 2022-12-29 (×14): 100 ug
  Filled 2022-12-14 (×14): qty 1

## 2022-12-14 MED ORDER — POTASSIUM CHLORIDE 20 MEQ PO PACK
40.0000 meq | PACK | Freq: Once | ORAL | Status: AC
  Administered 2022-12-14: 40 meq via ORAL
  Filled 2022-12-14: qty 2

## 2022-12-14 MED ORDER — ORAL CARE MOUTH RINSE
15.0000 mL | OROMUCOSAL | Status: DC
  Administered 2022-12-14 – 2022-12-15 (×16): 15 mL via OROMUCOSAL

## 2022-12-14 MED ORDER — LACTATED RINGERS IV BOLUS
1000.0000 mL | Freq: Once | INTRAVENOUS | Status: AC
  Administered 2022-12-14: 1000 mL via INTRAVENOUS

## 2022-12-14 MED ORDER — DOCUSATE SODIUM 50 MG/5ML PO LIQD
100.0000 mg | Freq: Two times a day (BID) | ORAL | Status: DC
  Administered 2022-12-14 – 2022-12-15 (×3): 100 mg
  Filled 2022-12-14 (×3): qty 10

## 2022-12-14 MED ORDER — DEXTROSE 50 % IV SOLN
INTRAVENOUS | Status: AC
  Filled 2022-12-14: qty 50

## 2022-12-14 MED ORDER — DEXTROSE IN LACTATED RINGERS 5 % IV SOLN: INTRAVENOUS | Status: DC

## 2022-12-14 MED ORDER — FENTANYL CITRATE PF 50 MCG/ML IJ SOSY: 25.0000 ug | PREFILLED_SYRINGE | INTRAMUSCULAR | Status: DC | PRN

## 2022-12-14 MED ORDER — NOREPINEPHRINE 4 MG/250ML-% IV SOLN
INTRAVENOUS | Status: AC
  Filled 2022-12-14: qty 250

## 2022-12-14 MED ORDER — MAGNESIUM SULFATE 2 GM/50ML IV SOLN
2.0000 g | Freq: Once | INTRAVENOUS | Status: AC
  Administered 2022-12-14: 2 g via INTRAVENOUS
  Filled 2022-12-14: qty 50

## 2022-12-14 MED ORDER — FAMOTIDINE 20 MG PO TABS
20.0000 mg | ORAL_TABLET | Freq: Two times a day (BID) | ORAL | Status: DC
  Administered 2022-12-14 – 2022-12-15 (×3): 20 mg
  Filled 2022-12-14 (×3): qty 1

## 2022-12-14 MED ORDER — DEXTROSE 10 % IV SOLN: INTRAVENOUS | Status: DC

## 2022-12-14 MED ORDER — PANTOPRAZOLE SODIUM 40 MG IV SOLR
40.0000 mg | Freq: Every day | INTRAVENOUS | Status: DC
  Administered 2022-12-14: 40 mg via INTRAVENOUS
  Filled 2022-12-14: qty 10

## 2022-12-14 MED ORDER — INSULIN ASPART 100 UNIT/ML IJ SOLN
0.0000 [IU] | INTRAMUSCULAR | Status: DC
  Administered 2022-12-16 – 2022-12-17 (×2): 1 [IU] via SUBCUTANEOUS
  Administered 2022-12-17 (×2): 2 [IU] via SUBCUTANEOUS
  Administered 2022-12-17 – 2022-12-18 (×3): 1 [IU] via SUBCUTANEOUS
  Administered 2022-12-18: 2 [IU] via SUBCUTANEOUS
  Administered 2022-12-18 (×2): 1 [IU] via SUBCUTANEOUS
  Administered 2022-12-18: 2 [IU] via SUBCUTANEOUS
  Administered 2022-12-18 – 2022-12-24 (×5): 1 [IU] via SUBCUTANEOUS

## 2022-12-14 MED ORDER — FOLIC ACID 1 MG PO TABS
1.0000 mg | ORAL_TABLET | Freq: Every day | ORAL | Status: DC
  Administered 2022-12-14 – 2022-12-29 (×15): 1 mg
  Filled 2022-12-14 (×15): qty 1

## 2022-12-14 MED ORDER — NICOTINE 7 MG/24HR TD PT24
7.0000 mg | MEDICATED_PATCH | Freq: Every day | TRANSDERMAL | Status: DC
  Administered 2022-12-14 – 2022-12-22 (×9): 7 mg via TRANSDERMAL
  Filled 2022-12-14 (×11): qty 1

## 2022-12-14 MED ORDER — THIAMINE MONONITRATE 100 MG PO TABS
100.0000 mg | ORAL_TABLET | Freq: Every day | ORAL | Status: DC
  Administered 2022-12-15: 100 mg
  Filled 2022-12-14: qty 1

## 2022-12-14 MED ORDER — SODIUM CHLORIDE 0.9 % IV SOLN
250.0000 mL | INTRAVENOUS | Status: DC
  Administered 2022-12-14: 250 mL via INTRAVENOUS

## 2022-12-14 MED ORDER — DEXTROSE 50 % IV SOLN
12.5000 g | INTRAVENOUS | Status: AC
  Administered 2022-12-14: 12.5 g via INTRAVENOUS
  Filled 2022-12-14: qty 50

## 2022-12-14 MED ORDER — IPRATROPIUM-ALBUTEROL 0.5-2.5 (3) MG/3ML IN SOLN
3.0000 mL | Freq: Four times a day (QID) | RESPIRATORY_TRACT | Status: DC
  Administered 2022-12-14 – 2022-12-15 (×5): 3 mL via RESPIRATORY_TRACT
  Filled 2022-12-14 (×5): qty 3

## 2022-12-14 NOTE — Progress Notes (Signed)
Initial Nutrition Assessment  DOCUMENTATION CODES:   Not applicable  INTERVENTION:  Tube feeds via OG-tube: Start Vital AF 1.2 at 20 mL/hr, advance by 10 mL every 12 hours to a goal rate of 55 mL/hr (1320 mL per day) Regimen provides 1584 kcal, 99 gm protein, and 1071 mL free water daily.  Monitor magnesium, potassium, and phosphorus BID for at least 3 days, MD to replete as needed, as pt is at risk for refeeding syndrome given recent gastroenteritis and EtOH abuse.   NUTRITION DIAGNOSIS:   Inadequate oral intake related to inability to eat as evidenced by NPO status.  GOAL:   Patient will meet greater than or equal to 90% of their needs  MONITOR:   TF tolerance, I & O's, Labs  REASON FOR ASSESSMENT:   Consult Enteral/tube feeding initiation and management  ASSESSMENT:   67 y.o. female presented to the ED with seizures. 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 Pt admitted with seizures.   8/04 - Admitted; Intubated  RD working remotely at time of assessment. Discussed with MD ok to start enteral nutrition. No abdominal x-ray to confirm OGT placement, MD ok with RD ordering. Discussed with RN to start TF once tube placement is confirmed.   Pt with recent gastroenteritis 2/2 mushroom ingestion with nausea and vomiting.   Patient is currently intubated on ventilator support. MV: 10 L/min MAP (cuff): 79 Temp (24hrs), Avg:97.5 F (36.4 C), Min:93.9 F (34.4 C), Max:98.9 F (37.2 C)  Drips D5 Versed Levophed (stopped)  Medications reviewed and include: Colace, Pepcid, Folic acid, NovoLog SSI, Protonix, Thiamine, IV Keppra Labs reviewed: Sodium 137, Potassium 4.2, BUN 20, Creatinine 1.39, Phosphorus 3.9, Magnesium 2.0, Hgb A1c 4.6   NUTRITION - FOCUSED PHYSICAL EXAM:  Deferred to follow-up.   Diet Order:   Diet Order             Diet NPO time specified  Diet effective now                    EDUCATION NEEDS:   Not appropriate for education at this time  Skin:  Skin Assessment: Reviewed RN Assessment  Last BM:  Unknown/PTA  Height:   Ht Readings from Last 1 Encounters:  12/13/22 5\' 2"  (1.575 m)    Weight:   Wt Readings from Last 1 Encounters:  12/14/22 56 kg    Ideal Body Weight:  50 kg  BMI:  Body mass index is 22.58 kg/m.  Estimated Nutritional Needs:  Kcal:  1500-1700 Protein:  75-95 grams Fluid:  >/= 1.5 L   Kirby Crigler RD, LDN Clinical Dietitian See [facility] for contact information.

## 2022-12-14 NOTE — Procedures (Signed)
Patient Name: Krystal Peterson  MRN: [id number]  Epilepsy Attending: Windell Norfolk  Referring Physician/Provider: Dr. Derry Lory Date: 12/14/2022 Start time: 8/4 at 0205 End Time: 8/4 at 0730 Duration: 5 hours and 25 minutes   Patient history: 58 year with history of stroke, who is presenting with AMS after a seizure, EEG for further evaluation.   Level of alertness: Intubated and sedated   AEDs during EEG study: Levetiracetam   Technical aspects: This EEG study was done with scalp electrodes positioned according to the 10-20 International system of electrode placement. Electrical activity was reviewed with band pass filter of 1-70Hz , sensitivity of 7 uV/mm, display speed of 36mm/sec with a 60Hz  notched filter applied as appropriate. EEG data were recorded continuously and digitally stored.  Video monitoring was available and reviewed as appropriate.  Description: EEG showed continuous generalized polymorphic sharply contoured 3 to 6 Hz theta-delta slowing. The EEG background was continuous and reactive. There were additional right temporal sharps, Sleep pattern was not seen.  Hyperventilation and photic stimulation were not performed.     ABNORMALITY -Sharp wave, right temporal region.  -Continuous slow, generalized   IMPRESSION: This study showed evidence of potential epileptogenicity arising from the right temporal region and moderate diffuse encephalopathy, nonspecific etiology but likely related to sedation, toxic-metabolic etiology, anoxic/hypoxic brain injury    

## 2022-12-14 NOTE — Progress Notes (Signed)
C4, F4, F8 no break down

## 2022-12-14 NOTE — Progress Notes (Signed)
NAME:  Krystal Peterson, MRN:  [id number], DOB:  02-Jul-1955, LOS: 0 ADMISSION DATE:  12/13/2022, CONSULTATION DATE:  12/14/2022 REFERRING MD:  EDP, CHIEF COMPLAINT:  seizure   History of Present Illness:  67 year old lady very limited care at [facility] health.  Record review shows that she has moderate-severe depression [PHQ-9 depression score 18 in November 2022], status post Biotronik ICD with last device check 09/16/2022, 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, PCA cryptogenic infarct on the left side January 2024 [facility] patch without atrial fibrillation] on antiplatelet therapy, ongoing smoking, intermittent UTI periodically on Cipro   Per EDP patient has had some nausea and vomiting and diarrhea 12/12/22 followng eating bad mushrooms for few days and therefore quit drinking alcohol which she normally does on a regular basis.  Then husband witnessed seizure by the husband with a left gaze.  Tonic-clonic generalized.   Husband did CPR on advise by EMS for 15 min though he felt she did not lose pulse. Had 1 more episode of seizure on arrival via EMS.  In the ER unresponsive and intubated.  Per ED.  Neuro does not think patient has acute stroke   She is on call to admit.  Pertinent  Medical History  Alcohol use disorder Tobacco used disorder Prolonged QT with cardiac arrest s/p ICD HTN SCC of the tongue s/p chemo, radiation Rheumatoid Arthritis  Significant Hospital Events: Including procedures, antibiotic start and stop dates in addition to other pertinent events     Interim History / Subjective:  Admitted to ICU. Sedated on versed.  Need peripheral pressors briefly due to sedation.  Objective   Blood pressure (!) 113/94, pulse 76, temperature 98.2 F (36.8 C), temperature source Oral, resp. rate (!) 26, height 5\' 2"  (1.575 m), weight 56 kg, SpO2 98%.     Vent Mode: PRVC FiO2 (%):  [40 %-100 %] 40 % Set Rate:  [16 bmp-24 bmp] 24 bmp Vt Set:  [400 mL] 400 mL PEEP:  [5 cmH20] 5 cmH20 Plateau Pressure:  [6 cmH20-13 cmH20] 13 cmH20   Intake/Output Summary (Last 24 hours) at 12/14/2022 0827 Last data filed at 12/14/2022 0604 Gross per 24 hour  Intake 2183.21 ml  Output 400 ml  Net 1783.21 ml   Filed Weights   12/13/22 2300 12/14/22 0240  Weight: 54.4 kg 56 kg    Examination: Gen:      Intubated, sedated, acutely ill appearing HEENT:  ETT to vent, LTM in place Lungs:    sounds of mechanical ventilation auscultated no wheeze CV:         RRR Abd:      + bowel sounds; soft, non-tender; no palpable masses, no distension Ext:    No edema Skin:      Warm and dry; no rashes Neuro:   sedated, RASS -2   Resolved Hospital Problem list     Assessment & Plan:   Acute encephalopathy Seizures, Likely breakthrough in the setting of alcohol withdrawal History of left PCA infarct -neurology consulted, appreciate recs. On keppraand versed with LTM for EEG. This morning shows right temporal focus of epileptogenecity with moderate diffuse encephalopathy - MRI brain today per neuro  Nausea, vomiting, suspected gastroenteritis ?Mushroom Ingestion - supportive care. Symptoms appear to be improved - replace electrolytes as needed - RUQ ordered previously  Acute hypoxemic respiratory failure - secondary to seizures encephalopathy - continue  full vent support, lung protective ventilation - VAP bundle - assess daily for daily SAT/SBT - ABG reviewed this morning with respiratory alkalosis - will decrease RR -CT Chest previously ordered  Tobacco use disorder, presumed COPD Alcohol use disorder - nicotine patch - benzos as above for withdrawal - nebs prn  Anxiety and Depression - continue home meds  VF Arrest 2015 s/p ICD placement, prolonged QT syndrome - tele - echo ordered previously  CKD Stage 3A HTN Hypokalemia- continue home  meds  Hypothyroidism - continue home meds   Best Practice (right click and "Reselect all SmartList Selections" daily)   Diet/type: NPO w/ meds via tube DVT prophylaxis: prophylactic heparin  GI prophylaxis: H2B Lines: N/A Foley:  Yes, and it is no longer needed Code Status:  full code Last date of multidisciplinary goals of care discussion [pending]  Labs   CBC: Recent Labs  Lab 12/13/22 2349 12/13/22 2353 12/14/22 0111 12/14/22 0358 12/14/22 0749  WBC 18.7*  --   --  18.6*  --   NEUTROABS 15.9*  --   --   --   --   HGB 16.0* 17.0* 13.3 14.4 12.6  HCT 47.9* 50.0* 39.0 42.0 37.0  MCV 104.1*  --   --  101.0*  --   PLT 384  --   --  294  --     Basic Metabolic Panel: Recent Labs  Lab 12/13/22 2353 12/14/22 0056 12/14/22 0111 12/14/22 0358 12/14/22 0749  NA 134* 139 138 137 137  K 5.5* 3.1* 3.2* 4.2 4.3  CL 105 101  --  104  --   CO2  --  19*  --  23  --   GLUCOSE 318* 156*  --  102*  --   BUN 34* 22  --  20  --   CREATININE 1.30* 1.44*  --  1.39*  --   CALCIUM  --  9.0  --  8.9  --   MG  --   --   --  2.0  --   PHOS  --   --   --  3.9  --    GFR: Estimated Creatinine Clearance: 31.1 mL/min (A) (by C-G formula based on SCr of 1.39 mg/dL (H)). Recent Labs  Lab 12/13/22 2349 12/14/22 0358  PROCALCITON  --  8.97  WBC 18.7* 18.6*  LATICACIDVEN  --  3.4*    Liver Function Tests: Recent Labs  Lab 12/14/22 0056  AST 31  ALT 25  ALKPHOS 59  BILITOT 0.5  PROT 5.9*  ALBUMIN 3.1*   Recent Labs  Lab 12/14/22 0358  LIPASE 28  AMYLASE 28   Recent Labs  Lab 12/14/22 0358  AMMONIA 25    ABG    Component Value Date/Time   PHART 7.427 12/14/2022 0749   PCO2ART 32.0 12/14/2022 0749   PO2ART 173 (H) 12/14/2022 0749   HCO3 21.0 12/14/2022 0749   TCO2 22 12/14/2022 0749   ACIDBASEDEF 2.0 12/14/2022 0749   O2SAT 100 12/14/2022 0749     Coagulation Profile: Recent Labs  Lab 12/13/22 2349 12/14/22 0358  INR 1.1 1.0    Cardiac  Enzymes: Recent Labs  Lab 12/14/22 0358  CKTOTAL 257*  CKMB 13.0*    HbA1C: Hgb A1c MFr Bld  Date/Time Value Ref Range Status  12/14/2022 03:58 AM 4.6 (L) 4.8 - 5.6 % Final    Comment:    (NOTE) Pre diabetes:          5.7%-6.4%  Diabetes:              >  6.4%  Glycemic control for   <7.0% adults with diabetes     CBG: Recent Labs  Lab 12/14/22 0231 12/14/22 0809  GLUCAP 74 92    Critical care time:     The patient is critically ill due to encephalopathy, seizures.  Critical care was necessary to treat or prevent imminent or life-threatening deterioration.  Critical care was time spent personally by me on the following activities: development of treatment plan with patient and/or surrogate as well as nursing, discussions with consultants, evaluation of patient's response to treatment, examination of patient, obtaining history from patient or surrogate, ordering and performing treatments and interventions, ordering and review of laboratory studies, ordering and review of radiographic studies, pulse oximetry, re-evaluation of patient's condition and participation in multidisciplinary rounds.   Critical Care Time devoted to patient care services described in this note is 50 minutes. This time reflects time of care of this signee Charlott Holler . This critical care time does not reflect separately billable procedures or procedure time, teaching time or supervisory time of PA/NP/Med student/Med Resident etc but could involve care discussion time.       Charlott Holler [facility] 12/14/2022 8:44 AM  Pager: see AMION  If no response to pager , please call critical care on call (see AMION) until 7pm After 7:00 pm call Elink

## 2022-12-14 NOTE — Progress Notes (Addendum)
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 Admitted for witnessed seizure and given versed 5 mg IV once. In ED intubated. Drinks 5 beers daily and has not been drinking due GI symptoms. Neuro consulted  K 3.2 BUN/Cr 22/1.44 WBC 18.7  After arrival became hypotensive to SBP 50-70s. Propofol discontinued. Peripheral levophed for sedation-related hypotension  eICU Interventions  S/p Keppra load followed by BID dosing Versed gtt per Neuro. Discontinue propofol LTM ordered MRI in am  Levophed for MAP >65 LR bolus 1L Full vent support. F/u ABG Routine CT Chest ordered Echo ordered RUQ ordered    Addendum LA 3.4. Additional 1L LR ordered   Intervention Category Evaluation Type: New Patient Evaluation   Krystal Peterson 12/14/2022, 2:36 AM

## 2022-12-14 NOTE — Progress Notes (Signed)
Study running in new room, [facility] NOTIFIED

## 2022-12-14 NOTE — H&P (Signed)
NAME:  Krystal Peterson, MRN:  [id number], DOB:  [birth year], LOS: 0 ADDRESS [address] [place] [address]  ADMISSION DATE:  12/13/2022, CONSULTATION DATE:  12/14/22 REFERRING MD:  Dr Randol Kern of ER, CHIEF COMPLAINT:  Resp failure and seizure  PCP -Dr. Darlis Loan at [facility] in [place]    History of Present Illness:  from EP and chart review  67 year old lady very limited care at [facility].  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  Then husband witnessed seizure by the husband with a left gaze.  Tonic-clonic generalized.   Husband did CPR on advise by EMS for 15 min though he felt she did not lose pulse. Had 1 more episode of seizure on arrival via EMS.  In the ER unresponsive and intubated.  Per ED.  Neuro does not think patient has acute stroke  She is on call to admit. Past Medical History:  PHQ-9 depression total score 18 [moderate-severe depression]  Medications as of April 2024 Medication Sig Dispense Refill  aspirin 81 mg EC tablet Take 81 mg by mouth Once Daily.  atorvastatin (LIPITOR) 40 mg tablet Take 1 tablet (40 mg total) by mouth daily at 6pm. 90 tablet 3  azelastine (OPTIVAR) 0.05 % drop ophthalmic solution Administer 1 drop into each eyes 2 (two) times a day as needed (allergies). 6 mL  0  ciprofloxacin (CIPRO) 500 mg tablet Take 1 tablet (500 mg total) by mouth 2 (two) times a day for 7 days. 14 tablet 0  escitalopram (LEXAPRO) 20 mg tablet Take 20 mg by mouth every evening. 90 tablet 1  levothyroxine (SYNTHROID) 100 mcg tablet Take 100 mcg by mouth Once Daily. 90 tablet 3  nadoloL (CORGARD) 20 mg tablet Take 20 mg by mouth nightly. 30 tablet 11  tamsulosin (FLOMAX) 0.4 mg cap Take 1 capsule (0.4 mg total) by mouth daily for 14 days. 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 Tub/Ovary Abcess from IUD  PANENDOSCOPY 08/08/2011  Procedure: PANENDOSCOPY; Surgeon: Jonell Cluck  Hezzie Bump, MD; Location: [facility] MAIN OR; Service: ENT; Laterality: N/A;  PARTIAL EXCISION TIBIA Right 01/16/2021  Procedure: PARTIAL EXCISION TIBIA; Surgeon: Deborah Chalk, MD; Location: [facility] OUTPATIENT OR; Service: Orthopedics; Laterality: Right;  SHOULDER SURGERY L.  Procedure: SHOULDER  SURGERY; Injury Work related accident  TISSUE REARRANGEMENT Right 01/16/2021  Procedure: TISSUE REARRANGEMENT LEG / HIP; Surgeon: Deborah Chalk, MD; Location: [facility] OUTPATIENT OR; Service: Orthopedics; Laterality: Right;   No Known Allergies Family History  Problem Relation Name Age of Onset  Stroke Mother  Nephrolithiasis Father  Heart failure Father  Cancer Sister  Cervical Cancer  Cancer Paternal Grandmother  Tongue Cancer   has no past medical history on file.   has no history on file for tobacco use.  The histories are not reviewed yet. Please review them in the "History" navigator section and refresh this SmartLink.  Not on File   There is no immunization history on file for this patient.  No family history on file.   Current Facility-Administered Medications:    midazolam (VERSED) 100 mg/100 mL (1 mg/mL) premix infusion, 0.5-10 mg/hr, Intravenous, Continuous, Erick Blinks, MD, Last Rate: 1 mL/hr at 12/14/22 0029, 1 mg/hr at 12/14/22 0029   sodium chloride flush (NS) 0.9 % injection 3 mL, 3 mL, Intravenous, Once, Bero, Elmer Sow, MD No current outpatient medications on file.     Significant Hospital Events:  12/13/2022 - admit   Interim History / Subjective:   12/14/2022 - seen in ER   Objective   Blood pressure 100/76, pulse (!) 109, temperature 97.6 F (36.4 C), resp. rate 16, height 5\' 2"  (1.575 m), weight 54.4 kg, SpO2 100%.    Vent Mode: PRVC FiO2 (%):  [100 %] 100 % Set Rate:  [16 bmp] 16 bmp Vt Set:  [400 mL] 400 mL PEEP:  [5 cmH20] 5 cmH20  No intake or output data in the 24 hours ending 12/14/22 0059 Filed Weights   12/13/22 2300  Weight: 54.4 kg    Examination: General: Thin female. INtubaeted HENT: ET tube + , OG tube + Lungs: Sync with vent, CTA bilaterally Cardiovascular: normal heart sounds. Left ICD + Abdomen: soft non tendeer Extremities: intact Neuro: RASS -4 on vdrsed infutions GU: not examined  Resolved Hospital Problem  list   x  Assessment & Plan:     Hx of ongoing smoking NOS History of emphysema -reported on CT scan 2015 at [facility] History of 4 mm right upper lobe and left lower lobe nodule stable 20 13-20 15  Acute respiratory failure due to acute obtunded encephalopathy with mixed metabolic and resp acidosis- admit 12/13/2022   P:   GEt ABG PRVC  VAP bundle BD Get CT chest without contrast Quit smoking counseling needed    History of right parietal arachnoid cyst -with progressive enlargement February 2024 2024  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  -Etiology cardioembolic versus cryptogenic [negative Zio patch]  -On dual antiplatelet therapy [aspirin and Plavix]  Hx of acute new onset seizure 12/13/22 x 2  P:   EEG per neuro Med per neuro  - - versed gtt  by neuro - titrate to seizures per [facility]  -= CCM wll dc diprivan   History of hypertension on propranolol History of hyperlipidemia on Lipitor 40  mg    P:  Maintain MAP  >65 Hold beta blocker Hold lipitor   Normal echocardiogram January 2024 -at [facility]   P: Repeat ECHO Cycle enzymes    History of V-fib arrest 2015 status post ICD associated long QT syndrome  No evidence of atrial fibrillation in Ja and April 2024 on hospital telemetry and subsequent Zio  Patch  S/p CPR at  home 12/13/22 x 15 min by husband did not thik she was in cardiac arrest  12/14/2022  in sinus  P: EKG Tele Cycle enzymes   History of renal calculi without AKI; baseline creat 0.9mg % April  2024 - multiple left renal upper pole calculi with mild hydronephrosis -seen on CT April 2024 - History of 1.5 cm nonobstructive stone without hydronephrosis -     P:  Monitor    History of mild hyponatremia -sodium 132  in April 2024  P: Track and repleate as apporpaite At risk for refeeding syndrome and electrolyte issues   History of sigmoid diverticulosis-seen on CT scan April 2024 at [facility] History of alcohol abuse -  -No evidence of cirrhosis on CT abdomen April 2024  Acute food poisoning 12/13/22 - resolved 12/14/22  - nausea, vomit, diarrhea  - husband and she ate bad mushrooms   P:   RUQ [place] Ammonia level    Mild anemia with hemoglobin 11.3 g% at baseline -April 2024   P:  - PRBC for hgb </= 6.9gm%    - exceptions are   -  if ACS susepcted/confirmed then transfuse for hgb </= 8.0gm%,  or    -  active bleeding with hemodynamic instability, then transfuse regardless of hemoglobin value   At at all times try to transfuse 1 unit prbc as possible with exception of active hemorrhage     Normal  HgA1c [name] 2024    P:   Track clinically ssi    Best practice (daily eval):  Diet: npo and then later TF Pain/Anxiety/Delirium protocol (if indicated): yes VAP protocol (if indicated): yes DVT prophylaxis: heparin sq GI prophylaxis: ppi Glucose control: ssi Mobility: bed rest Code Status: full  Disposition: ER to ICU  Family: Husband [phone], Juanita Laster        ATTESTATION & SIGNATURE   The patient YOSHIKA VENSEL is critically ill with multiple organ systems failure and requires high complexity decision making for assessment and support, frequent evaluation and titration of therapies, application of advanced monitoring technologies and extensive interpretation of multiple databases.   Critical Care Time devoted to patient care services described in this note is  90  Minutes. This time reflects time of care of this signee Dr Kalman Shan. This critical care time does not reflect procedure time, or teaching time or supervisory time of PA/NP/Med student/Med Resident etc but could involve care discussion time      SIGNATURE    Dr. Kalman Shan, M.D.,  F.C.C.P,  Pulmonary and Critical Care Medicine Staff Physician, [facility] Director - Interstitial Lung Disease  Program  Pulmonary Fibrosis [facility] at [facility] [place], [place], [address]  NPI Number:  NPI #[id number]  Pager: [phone], If no answer  -> Check AMION or Try [phone] Telephone (clinical office): [phone] Telephone (research): [phone]  12:59 AM 12/14/2022   12/14/2022 12:59 AM    LABS    PULMONARY Recent Labs  Lab 12/13/22 2353  TCO2 16*    CBC Recent Labs  Lab 12/13/22 2349 12/13/22 2353  HGB 16.0* 17.0*  HCT 47.9* 50.0*  WBC 18.7*  --   PLT 384  --     COAGULATION Recent Labs  Lab 12/13/22 2349  INR 1.1    CARDIAC  No results for input(s): "TROPONINI" in the last 168 hours. No results for input(s): "PROBNP" in the last 168 hours.  CHEMISTRY Recent Labs  Lab 12/13/22 2353  NA 134*  K 5.5*  CL 105  GLUCOSE 318*  BUN 34*  CREATININE 1.30*   Estimated Creatinine Clearance: 33.2 mL/min (A) (by C-G formula based on SCr of 1.3 mg/dL (H)).   LIVER Recent Labs  Lab 12/13/22 2349  INR 1.1     INFECTIOUS No results for input(s): "LATICACIDVEN", "PROCALCITON" in the last 168 hours.   ENDOCRINE CBG (last 3)  No results for input(s): "GLUCAP" in the last 72 hours.       IMAGING x48h  - image(s) personally visualized  -   highlighted in bold CT ANGIO HEAD NECK W WO CM W PERF (CODE STROKE)  Result Date: 12/14/2022 CLINICAL DATA:  Initial evaluation for neuro deficit, stroke. EXAM: CT ANGIOGRAPHY HEAD AND NECK CT PERFUSION BRAIN TECHNIQUE: Multidetector CT imaging of the head and neck was performed using the standard protocol during bolus administration of intravenous contrast. Multiplanar CT image reconstructions and MIPs were obtained to evaluate the vascular anatomy. Carotid stenosis measurements (when applicable) are obtained utilizing NASCET criteria, using the distal  internal carotid diameter as the denominator. Multiphase CT imaging of the brain was performed following IV bolus contrast injection. Subsequent parametric perfusion maps were calculated using RAPID software. RADIATION DOSE REDUCTION: This exam was performed according to the departmental dose-optimization program which includes automated exposure control, adjustment of the mA and/or kV according to patient size and/or use of iterative reconstruction technique. CONTRAST:  OMNIPAQUE IOHEXOL 350 MG/ML SOLN COMPARISON:  CT from earlier the same day as well as prior exam from 05/28/2022. FINDINGS: CTA NECK FINDINGS Aortic arch: Visualized aortic arch normal caliber standard branch pattern. Atheromatous change about the arch itself. No significant stenosis about the origin the great vessels. Right carotid system: Right common and internal carotid arteries are patent without stenosis or dissection. Left carotid system: Left CCA patent to the bifurcation. 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 Left ICA remains occluded to the skull base. Vertebral arteries: Both vertebral arteries arise from the subclavian arteries. No significant proximal subclavian artery stenosis. Moderate atheromatous stenosis at the origin of the left vertebral artery which is dominant. Vertebral arteries otherwise patent distally without stenosis or dissection. Skeleton: No discrete or worrisome osseous lesions. Chronic compression deformity involving the T1 vertebral body noted, stable. Patient is edentulous. Other neck: Endotracheal tube in place.  No other acute finding. Upper chest: Emphysema. Left-sided pacemaker/AICD in place. No other acute finding. Review of the MIP images confirms the above findings CTA HEAD FINDINGS Anterior circulation: Right ICA widely patent through the siphon without stenosis. There is distal reconstitution of the left ICA at the left carotid siphon via the ophthalmic  artery and flow across the circle-of-Willis. A1 segments patent bilaterally. Normal anterior communicating complex. Anterior cerebral arteries patent without stenosis. No M1 stenosis or occlusion. No proximal MCA branch occlusion or high-grade stenosis. Distal MCA branches perfused and symmetric. Posterior circulation: Both V4 segments widely patent. Left PICA patent. Right PICA not both seen. Basilar patent without stenosis. Superior cerebellar arteries patent bilaterally. Right PCA widely  patent to its distal aspect without stenosis. 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 Venous sinuses: Grossly patent allowing for timing of the contrast bolus. Anatomic variants: As above.  No aneurysm. Review of the MIP images confirms the above findings CT Brain Perfusion Findings: ASPECTS: 10 CBF (<30%) Volume: 26mL Perfusion (Tmax>6.0s) volume: 74mL Mismatch Volume: 48mL Infarction Location:Apparent 26 mL area of C BF less than 30% largely corresponds with the right occipital rack noise cyst and overlying occipital convexities, favored to be artifactual. Similarly, 74 mL apparent perfusion abnormality at least in part reflects artifact as well. Some delayed perfusion within the left cerebral hemisphere related to the chronically occluded left ICA and PCAs is likely contributory. No convincing acute ischemic changes. IMPRESSION: CTA HEAD AND NECK: 1. Negative CTA for large vessel occlusion or other emergent finding. 2. 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 Distal reconstitution at the left carotid siphon via the ophthalmic artery and flow across the circle-of-Willis. 3. Chronic occlusion of the left PCA at the proximal left P2 segment, stable. 4. Moderate atheromatous stenosis at the origin of the dominant left vertebral artery. CT PERFUSION: Negative CT perfusion. Apparent perfusion deficits are felt to be consistent with artifact and/or the chronic left ICA/PCA  occlusions as discussed above. Aortic Atherosclerosis (ICD10-I70.0) and Emphysema (ICD10-J43.9). Case discussed by telephone around the time of interpretation on 12/14/2022 at 12:25 a.m. to provider [facility]. Electronically Signed   By: Rise Mu M.D.   On: 12/14/2022 00:47   DG Chest Port 1 View  Result Date: 12/14/2022 CLINICAL DATA:  ET tube EXAM: PORTABLE CHEST 1 VIEW COMPARISON:  05/28/2022 FINDINGS: Endotracheal tube is 5 cm above the carina. NG tube enters the stomach. Left pacer remains in place, unchanged. Heart and mediastinal contours within normal limits. No confluent opacities or effusions. No acute bony abnormality. IMPRESSION: Support devices in expected position as above. No acute cardiopulmonary disease. Electronically Signed   By: Charlett Nose M.D.   On: 12/14/2022 00:29   CT HEAD CODE STROKE WO CONTRAST  Result Date: 12/14/2022 CLINICAL DATA:  Code stroke. EXAM: CT HEAD WITHOUT CONTRAST TECHNIQUE: Contiguous axial images were obtained from the base of the skull through the vertex without intravenous contrast. RADIATION DOSE REDUCTION: This exam was performed according to the departmental dose-optimization program which includes automated exposure control, adjustment of the mA and/or kV according to patient size and/or use of iterative reconstruction technique. COMPARISON:  Prior study from 06/26/2022. FINDINGS: Brain: Cerebral volume within normal limits for age. Mild chronic small vessel ischemic disease noted. Chronic left PCA distribution infarct. Benign arachnoid cyst at the posterior right occipital region. No acute intracranial hemorrhage. No acute large vessel territory infarct. No mass lesion or midline shift. No hydrocephalus or extra-axial fluid collection. Vascular: No abnormal hyperdense vessel. Skull: Scalp soft tissues within normal limits.  Calvarium intact. Sinuses/Orbits: Globes and orbital soft tissues within normal limits. Mild pneumatized secretions  within the posterior right ethmoidal air cells. Paranasal sinuses are otherwise clear. No significant mastoid effusion. Other: Endotracheal tube in place. ASPECTS [facility] Stroke Program Early CT Score) - Ganglionic level infarction (caudate, lentiform nuclei, internal capsule, insula, M1-M3 cortex): 7 - Supraganglionic infarction (M4-M6 cortex): 3 Total score (0-10 with 10 being normal): 10 IMPRESSION: 1. No acute intracranial abnormality. 2. Aspects is 10. 3. Mild chronic microvascular ischemic disease with chronic left PCA distribution infarct. 4. Posterior right cerebral arachnoid cyst, stable. These results were communicated to  Dr. Derry Lory at 12:07 am on 12/14/2022 by text page via the [facility] messaging system. Electronically Signed   By: Rise Mu M.D.   On: 12/14/2022 00:08

## 2022-12-14 NOTE — Progress Notes (Signed)
ABG attempt x4 with no results

## 2022-12-14 NOTE — ED Provider Notes (Signed)
  [facility] AT [facility] Provider Note      Procedures Procedure Name: Intubation Date/Time: 12/14/2022 12:05 AM  Performed by: Roxy Horseman, PA-CPre-anesthesia Checklist: Patient identified, Patient being monitored, Emergency Drugs available, Timeout performed and Suction available Oxygen Delivery Method: Non-rebreather mask Preoxygenation: Pre-oxygenation with 100% oxygen Induction Type: Rapid sequence Ventilation: Mask ventilation without difficulty Laryngoscope Size: Glidescope and 3 Tube size: 7.5 mm Number of attempts: 1 Airway Equipment and Method: Video-laryngoscopy Placement Confirmation: ETT inserted through vocal cords under direct vision, CO2 detector and Breath sounds checked- equal and bilateral Secured at: 22 cm Tube secured with: ETT holder Dental Injury: Teeth and Oropharynx as per pre-operative assessment          Roxy Horseman, PA-C 12/14/22 0006    Sabas Sous, MD 12/14/22 [phone]

## 2022-12-14 NOTE — Code Documentation (Addendum)
Responded to Code Stroke called at 2316 for R sided deficits and seizure, [id number]. Pt arrived at 2340 being bagged by EMS via BVM. NIH-35. Pt taken to ED room and intubated for airway protection using 20mg  etomidate and 100mg  succ. Propofol gtt initiated and pt taken to CT. CT head negative for acute changes. Keppra 3g started in CT. CTA/CTP-no LVO. TNK not given-seizure, low suspicion for stroke. Plan: stroke/seizure workup. Please do VS/neuro checks q2h x 12, then q4h or per order/unit protocol if more frequent.

## 2022-12-14 NOTE — Progress Notes (Signed)
An USGPIV (ultrasound guided PIV) has been placed for short-term vasopressor infusion. A correctly placed ivWatch must be used when administering Vasopressors. Should this treatment be needed beyond 24 hours, central line access should be obtained.  It will be the responsibility of the bedside nurse to follow best practice to prevent extravasations.

## 2022-12-14 NOTE — Progress Notes (Signed)
LTM EEG running - no initial skin breakdown - push button tested - neuro notified.  

## 2022-12-14 NOTE — Progress Notes (Signed)
Bg was 69 upon recheck, will initiate hypoglycemia protocol

## 2022-12-14 NOTE — Progress Notes (Signed)
PT BG 677 @ 1126. Tube feeds Vital 1.2 started at 14ml/hr. Will recheck BG in 1 Hour.

## 2022-12-14 NOTE — Progress Notes (Signed)
Pt moving rooms

## 2022-12-14 NOTE — Progress Notes (Signed)
Subjective: No further seizures, there is question about possible eating bad mushrooms.  She was becoming agitated shortly before I came in the room and therefore sedation has been increased.  Exam: Vitals:   12/14/22 1015 12/14/22 1130  BP: 91/72   Pulse: 74   Resp: 18   Temp:  97.7 F (36.5 C)  SpO2: 100%    Gen: In bed, NAD Resp: non-labored breathing, no acute distress Abd: soft, nt  Neuro: MS: With stimulation I am able to get her to arouse and follow commands to squeeze fingers bilaterally CN: Pupils are equal round and reactive, blinks to eyelid stimulation bilaterally Motor: She moves all extremities spontaneously and symmetrically Sensory: Intact to light touch  Pertinent Labs:   Impression: 67 year old female with a history of alcohol abuse as well as previous cortical stroke who was unable to drink her typical amount due to nausea and vomiting after eating bad mushrooms who has had seizure activity.  Seizure likely multifactorial secondary to previous stroke and alcohol withdrawal.  She has epileptiform activity on EEG, and therefore will need long-term antiepileptic therapy.  No further seizures.  Recommendations: 1) continue Keppra 500 twice daily 2) continue LTM EEG 3) neurology will continue to follow  Ritta Slot, MD [facility] [phone]  If 7pm- 7am, please page neurology on call as listed in AMION.

## 2022-12-14 NOTE — ED Notes (Signed)
Family Is in the room with the patient at this time

## 2022-12-14 NOTE — Progress Notes (Signed)
eLink Physician-Brief Progress Note Patient Name: Krystal Peterson DOB: 27-Sep-1955 MRN: [id number]   Date of Service  12/14/2022  HPI/Events of Note  Patient requiring multiple doses of as needed fentanyl for sedation/agitation. Also has hypoglycemia despite initiation of tube feeds.  As well, has electrolyte imbalances.  eICU Interventions  Started on a low-dose fentanyl infusion for sedation/agitation.  Also placed on 10% dextrose solution as a continuous infusion.  Electrolytes repleted.  Will follow a.m. labs.     Intervention Category Minor Interventions: Electrolytes abnormality - evaluation and management;Other:  Carilyn Goodpasture 12/14/2022, 7:56 PM

## 2022-12-15 ENCOUNTER — Inpatient Hospital Stay (HOSPITAL_COMMUNITY): Payer: Medicare Other

## 2022-12-15 ENCOUNTER — Encounter (HOSPITAL_COMMUNITY): Payer: Self-pay | Admitting: Internal Medicine

## 2022-12-15 DIAGNOSIS — J9601 Acute respiratory failure with hypoxia: Secondary | ICD-10-CM | POA: Diagnosis not present

## 2022-12-15 DIAGNOSIS — Z9911 Dependence on respirator [ventilator] status: Secondary | ICD-10-CM | POA: Diagnosis not present

## 2022-12-15 DIAGNOSIS — I428 Other cardiomyopathies: Secondary | ICD-10-CM

## 2022-12-15 DIAGNOSIS — R569 Unspecified convulsions: Secondary | ICD-10-CM | POA: Diagnosis not present

## 2022-12-15 LAB — POCT I-STAT 7, (LYTES, BLD GAS, ICA,H+H)
Acid-base deficit: 1 mmol/L (ref 0.0–2.0)
Bicarbonate: 22.4 mmol/L (ref 20.0–28.0)
Calcium, Ion: 1.21 mmol/L (ref 1.15–1.40)
HCT: 35 % — ABNORMAL LOW (ref 36.0–46.0)
Hemoglobin: 11.9 g/dL — ABNORMAL LOW (ref 12.0–15.0)
O2 Saturation: 96 %
Patient temperature: 97.7
Potassium: 3.6 mmol/L (ref 3.5–5.1)
Sodium: 137 mmol/L (ref 135–145)
TCO2: 23 mmol/L (ref 22–32)
pCO2 arterial: 30.2 mmHg — ABNORMAL LOW (ref 32–48)
pH, Arterial: 7.477 — ABNORMAL HIGH (ref 7.35–7.45)
pO2, Arterial: 76 mmHg — ABNORMAL LOW (ref 83–108)

## 2022-12-15 LAB — GLUCOSE, CAPILLARY
Glucose-Capillary: 105 mg/dL — ABNORMAL HIGH (ref 70–99)
Glucose-Capillary: 90 mg/dL (ref 70–99)
Glucose-Capillary: 90 mg/dL (ref 70–99)
Glucose-Capillary: 105 mg/dL — ABNORMAL HIGH (ref 70–99)
Glucose-Capillary: 107 mg/dL — ABNORMAL HIGH (ref 70–99)
Glucose-Capillary: 99 mg/dL (ref 70–99)

## 2022-12-15 LAB — CORTISOL: Cortisol, Plasma: 6.9 ug/dL

## 2022-12-15 LAB — TROPONIN I (HIGH SENSITIVITY): Troponin I (High Sensitivity): 54 ng/L — ABNORMAL HIGH (ref <18)

## 2022-12-15 LAB — TRIGLYCERIDES: Triglycerides: 108 mg/dL (ref <150)

## 2022-12-15 MED ORDER — ASPIRIN 81 MG PO CHEW
81.0000 mg | CHEWABLE_TABLET | Freq: Every day | ORAL | Status: DC
  Administered 2022-12-15 – 2022-12-29 (×14): 81 mg
  Filled 2022-12-15 (×14): qty 1

## 2022-12-15 MED ORDER — PHENOBARBITAL SODIUM 65 MG/ML IJ SOLN
65.0000 mg | Freq: Three times a day (TID) | INTRAMUSCULAR | Status: DC
  Administered 2022-12-18 – 2022-12-19 (×2): 65 mg via INTRAVENOUS
  Filled 2022-12-15 (×4): qty 1

## 2022-12-15 MED ORDER — FENTANYL 2500MCG IN NS 250ML (10MCG/ML) PREMIX INFUSION: 25.0000 ug/h | INTRAVENOUS | Status: DC

## 2022-12-15 MED ORDER — OLANZAPINE 10 MG IM SOLR
5.0000 mg | Freq: Four times a day (QID) | INTRAMUSCULAR | Status: DC | PRN
  Administered 2022-12-15 – 2022-12-16 (×2): 5 mg via INTRAMUSCULAR
  Filled 2022-12-15 (×2): qty 10

## 2022-12-15 MED ORDER — FENTANYL BOLUS VIA INFUSION: 25.0000 ug | INTRAVENOUS | Status: DC | PRN

## 2022-12-15 MED ORDER — ORAL CARE MOUTH RINSE: 15.0000 mL | OROMUCOSAL | Status: DC | PRN

## 2022-12-15 MED ORDER — DEXMEDETOMIDINE HCL IN NACL 400 MCG/100ML IV SOLN
0.0000 ug/kg/h | INTRAVENOUS | Status: DC
  Administered 2022-12-15: 1.2 ug/kg/h via INTRAVENOUS
  Administered 2022-12-15: 0.4 ug/kg/h via INTRAVENOUS
  Administered 2022-12-15: 1.2 ug/kg/h via INTRAVENOUS
  Administered 2022-12-16: 1 ug/kg/h via INTRAVENOUS
  Administered 2022-12-16: 0.4 ug/kg/h via INTRAVENOUS
  Administered 2022-12-17: 0.8 ug/kg/h via INTRAVENOUS
  Administered 2022-12-17 (×2): 1 ug/kg/h via INTRAVENOUS
  Administered 2022-12-18: 0.9 ug/kg/h via INTRAVENOUS
  Administered 2022-12-18: 0.4 ug/kg/h via INTRAVENOUS
  Administered 2022-12-19: 0.6 ug/kg/h via INTRAVENOUS
  Administered 2022-12-19 (×2): 1.2 ug/kg/h via INTRAVENOUS
  Administered 2022-12-19 – 2022-12-20 (×2): 1.5 ug/kg/h via INTRAVENOUS
  Filled 2022-12-15 (×17): qty 100

## 2022-12-15 MED ORDER — ADULT MULTIVITAMIN W/MINERALS CH
1.0000 | ORAL_TABLET | Freq: Every day | ORAL | Status: DC
  Administered 2022-12-15 – 2022-12-29 (×14): 1
  Filled 2022-12-15 (×14): qty 1

## 2022-12-15 MED ORDER — SODIUM CHLORIDE 0.9 % IV SOLN
260.0000 mg | Freq: Once | INTRAVENOUS | Status: AC
  Administered 2022-12-15: 260 mg via INTRAVENOUS
  Filled 2022-12-15: qty 2

## 2022-12-15 MED ORDER — PHENOBARBITAL SODIUM 130 MG/ML IJ SOLN
97.5000 mg | Freq: Three times a day (TID) | INTRAMUSCULAR | Status: AC
  Administered 2022-12-16 – 2022-12-17 (×5): 97.5 mg via INTRAVENOUS
  Filled 2022-12-15 (×5): qty 1

## 2022-12-15 MED ORDER — LEVETIRACETAM IN NACL 1000 MG/100ML IV SOLN
1000.0000 mg | Freq: Once | INTRAVENOUS | Status: AC
  Administered 2022-12-15: 1000 mg via INTRAVENOUS
  Filled 2022-12-15: qty 100

## 2022-12-15 MED ORDER — LEVETIRACETAM IN NACL 1000 MG/100ML IV SOLN
1000.0000 mg | Freq: Two times a day (BID) | INTRAVENOUS | Status: DC
  Administered 2022-12-15 – 2022-12-21 (×12): 1000 mg via INTRAVENOUS
  Filled 2022-12-15 (×12): qty 100

## 2022-12-15 MED ORDER — IPRATROPIUM-ALBUTEROL 0.5-2.5 (3) MG/3ML IN SOLN: 3.0000 mL | Freq: Four times a day (QID) | RESPIRATORY_TRACT | Status: DC | PRN

## 2022-12-15 MED ORDER — OLANZAPINE 10 MG IM SOLR
2.5000 mg | Freq: Four times a day (QID) | INTRAMUSCULAR | Status: DC | PRN
  Administered 2022-12-15: 2.5 mg via INTRAMUSCULAR
  Filled 2022-12-15 (×3): qty 10

## 2022-12-15 MED ORDER — ORAL CARE MOUTH RINSE: 15.0000 mL | OROMUCOSAL | Status: DC

## 2022-12-15 MED ORDER — PHENOBARBITAL SODIUM 65 MG/ML IJ SOLN: 32.5000 mg | Freq: Three times a day (TID) | INTRAMUSCULAR | Status: DC

## 2022-12-15 MED ORDER — MIDAZOLAM HCL 2 MG/2ML IJ SOLN
1.0000 mg | Freq: Four times a day (QID) | INTRAMUSCULAR | Status: DC | PRN
  Administered 2022-12-15: 1 mg via INTRAVENOUS
  Filled 2022-12-15: qty 2

## 2022-12-15 MED ORDER — LORAZEPAM 2 MG/ML IJ SOLN
1.0000 mg | INTRAMUSCULAR | Status: DC | PRN
  Administered 2022-12-15 – 2022-12-16 (×2): 1 mg via INTRAVENOUS
  Filled 2022-12-15 (×2): qty 1

## 2022-12-15 NOTE — TOC CM/SW Note (Signed)
Transition of Care [facility]) - Inpatient Brief Assessment   Patient Details  Name: Krystal Peterson MRN: [id number] Date of Birth: 12-28-1955  Transition of Care [facility]) CM/SW Contact:    Tom-Johnson, Hershal Coria, RN Phone Number: 12/15/2022, 3:44 PM   Clinical Narrative:  Patient presented to the ED after a Seizure episode and unresponsiveness. Patient was intubated on arrival, extubated today and currently on 3L O2. On continuous EEG, Neurology following. Patient has Hx of Stroke, ETOH and Smoking.    CM spoke with patient, husband and daughter, [name] at bedside.  Patient has two supportive children. Retired, does not drive, husband transports to and from appointments since prior Strokes. PCP is Dr. Mayford Knife with  Pediatrics,[place] and uses [facility].   No TOC needs or recommendations noted at this time.  Patient not Medically ready for discharge.  CM will continue to follow as patient progresses with care towards discharge.   Transition of Care Asessment: Insurance and Status: Insurance coverage has been reviewed Patient has primary care physician: Yes Home environment has been reviewed: Yes Prior level of function:: Independent Prior/Current Home Services: No current home services Social Determinants of Health Reivew: SDOH reviewed no interventions necessary Readmission risk has been reviewed: Yes Transition of care needs: transition of care needs identified, TOC will continue to follow

## 2022-12-15 NOTE — Progress Notes (Addendum)
eLink Physician-Brief Progress Note Patient Name: Krystal Peterson DOB: 1956-04-22 MRN: [id number]   Date of Service  12/15/2022  HPI/Events of Note  pretty severe agitation.   S/p extubation earlier today.  Camera: Pulling lines, on nasal o2. Confused. Getting IV team for evaluation for lines.  VS stable No seizures  on 5 point restraints now. precedex at 1.2 Received versed 1mg  @ 1830 Received zyprexa @ 1945 Qtc 485    eICU Interventions  Stat ABG now for any co2 re tension. On admit was > 65/but had seizures.  At home on lexapro  Daughter is sitting in room to help with her agitation, re orientation.      Intervention Category Intermediate Interventions: Other: (agitation)  Krystal Peterson 12/15/2022, 9:44 PM  22:22  ABG pco2 30 Camera: Still ry thing in bed, confused. Encephalopathy. No seizures. ETOH withdrawals. Just got Keprra IV, but PIV not working, so did not get full dose.  IV team is going to come for new PIV  - CIWA protocol- phenobarbital ( 5 beers a day).

## 2022-12-15 NOTE — Progress Notes (Signed)
Subjective: Single seizure overnight.   Exam: Vitals:   12/15/22 1000 12/15/22 1015  BP: 134/80 132/81  Pulse: 72 77  Resp: 10 20  Temp:    SpO2: 100% 100%   Gen: In bed, NAD Resp: non-labored breathing, no acute distress Abd: soft, nt  Neuro: MS: opens eyes, follows commands.  CN: Pupils are equal round and reactive, blinks to eyelid stimulation bilaterally Motor: She moves all extremities spontaneously and symmetrically Sensory: Intact to light touch  Pertinent Labs:   Impression: 67 year old female with a history of alcohol abuse as well as previous cortical stroke who was unable to drink her typical amount due to nausea and vomiting after eating bad mushrooms who has had seizure activity.  Seizure likely multifactorial secondary to previous stroke and alcohol withdrawal.  She had a single seizure overnight, I will advance her Keppra therapy.  Recommendations: 1) increase Keppra to 1 g twice daily 2) continue LTM EEG 3) agree with weaning towards extubation 4) neurology will continue to follow  This patient is critically ill and at significant risk of neurological worsening, death and care requires constant monitoring of vital signs, hemodynamics,respiratory and cardiac monitoring, neurological assessment, discussion with family, other specialists and medical decision making of high complexity. I spent 39 minutes of neurocritical care time  in the care of  this patient. This was time spent independent of any time provided by nurse practitioner or PA.  Ritta Slot, MD [facility] [phone]  If 7pm- 7am, please page neurology on call as listed in AMION. 12/15/2022  1:26 PM

## 2022-12-15 NOTE — Procedures (Signed)
Patient Name: Krystal Peterson  MRN: [id number]  Epilepsy Attending: Jefferson Fuel  Referring Physician/Provider: Dr. Derry Lory Date: 12/15/22 Start time: 8/5 at 0730 End Time: 8/5 at 0730 Duration: 24 hrs  Patient history: 44 year with history of stroke, who is presenting with AMS after a seizure, EEG for further evaluation.   Level of alertness: Intubated and sedated   AEDs during EEG study: Levetiracetam. Sedated on precedex  Technical aspects: This EEG study was done with scalp electrodes positioned according to the 10-20 International system of electrode placement. Electrical activity was reviewed with band pass filter of 1-70Hz , sensitivity of 7 uV/mm, display speed of 76mm/sec with a 60Hz  notched filter applied as appropriate. EEG data were recorded continuously and digitally stored.  Video monitoring was available and reviewed as appropriate.  Description: EEG showed continuous generalized polymorphic sharply contoured 3 to 6 Hz theta-delta slowing. The EEG background was continuous and reactive. There were additional right temporal sharps, Sleep pattern was not seen. There was on focal electrographic seizure at 2202 lasting <1 min from the right temporal region that did not generalize. There were no clinical signs on video. Hyperventilation and photic stimulation were not performed.     ABNORMALITY - Sharp wave, right temporal region - One right temporal seizure without clinical signs lasting <1 min at 2202 - Continuous slow, generalized   IMPRESSION: This study showed evidence of potential epileptogenicity arising from the right temporal region and moderate diffuse encephalopathy, nonspecific etiology but likely related to sedation, toxic-metabolic etiology, anoxic/hypoxic brain injury. There was once right temporal electrographic seizure without clinical signs lasting <1 min at 2202.   Jefferson Fuel

## 2022-12-15 NOTE — Progress Notes (Signed)
Awake, very agitated, aggressively attempting to self-continue her ETT. She is directable but has a short attention span. Able to sit forward and bend in half, making it hard for restraints to adequately continue her ETT. She can pull >1.5L Vt on 5/5 when agitated and ~500cc when resting comfortably. Will extubate despite not passing full SBT due to severe agitation.  D/w neuro- ok to d/c versed infusion. ETCO2 needed post-extubation.   Steffanie Dunn, DO 12/15/22 9:56 AM [facility]  For contact information, see Amion. If no response to pager, please call PCCM consult pager. After hours, 7PM- 7AM, please call Elink.

## 2022-12-15 NOTE — Progress Notes (Signed)
Echocardiogram 2D Echocardiogram has been performed.  Krystal Peterson 12/15/2022, 8:29 AM

## 2022-12-15 NOTE — Procedures (Signed)
Extubation Procedure Note  Patient Details:   Name: Krystal Peterson DOB: 1955-05-20 MRN: [id number]   Airway Documentation:    Vent end date: 12/15/22 Vent end time: 1015   Evaluation  O2 sats: stable throughout Complications: No apparent complications Patient did tolerate procedure well. Bilateral Breath Sounds: Diminished, Rhonchi   Yes  Pt extubated to 2l [place] with RN X2 at bedside. Positive cuff leak noted and pt is tolerating well. RT will monitor.  Lajuan Lines 12/15/2022, 10:18 AM

## 2022-12-15 NOTE — Progress Notes (Signed)
Still agitated on precedex despite low dose olanzapine PRN. Increase PRN dose of olanzapine Add versed PRN for uncontrolled agitation.  Steffanie Dunn, DO 12/15/22 6:14 PM [facility]

## 2022-12-15 NOTE — Progress Notes (Signed)
LTM maint complete - no skin breakdown under:  O1,O2,C4

## 2022-12-15 NOTE — Progress Notes (Signed)
NAME:  SHARMEKA KREIKEMEIER, MRN:  [id number], DOB:  03/14/1956, LOS: 1 ADMISSION DATE:  12/13/2022, CONSULTATION DATE:  12/15/2022 REFERRING MD:  EDP, CHIEF COMPLAINT:  seizure   History of Present Illness:  67 year old lady very limited care at [facility] health.  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 patch without atrial fibrillation] on antiplatelet therapy, ongoing smoking, intermittent UTI periodically on Cipro   Per EDP patient has had some nausea and vomiting and diarrhea 12/12/22 followng eating bad mushrooms for few days and therefore quit drinking alcohol which she normally does on a regular basis.  Then husband witnessed seizure by the husband with a left gaze.  Tonic-clonic generalized.   Husband did CPR on advise by EMS for 15 min though he felt she did not lose pulse. Had 1 more episode of seizure on arrival via EMS.  In the ER unresponsive and intubated.  Per ED.  Neuro does not think patient has acute stroke   She is on call to admit.  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 Had been off ETOH due to n/v/d. 8/5 extubated  Interim History / Subjective:  This morning she denies complaints.  Afebrile overnight.   Objective   Blood pressure 121/81, pulse 69, temperature 98.9 F (37.2 C), temperature  source Oral, resp. rate 18, height 5\' 2"  (1.575 m), weight 56.3 kg, SpO2 100%.    Vent Mode: PRVC FiO2 (%):  [40 %] 40 % Set Rate:  [18 bmp] 18 bmp Vt Set:  [400 mL] 400 mL PEEP:  [5 cmH20] 5 cmH20 Plateau Pressure:  [11 cmH20-12 cmH20] 12 cmH20   Intake/Output Summary (Last 24 hours) at 12/15/2022 0910 Last data filed at 12/15/2022 0800 Gross per 24 hour  Intake 1834.85 ml  Output --  Net 1834.85 ml   Filed Weights   12/13/22 2300 12/14/22 0240 12/15/22 0316  Weight: 54.4 kg 56 kg 56.3 kg    Examination: Gen:     Chronically ill appearing woman lying in bed, intermittently very agitated.  HEENT:   [place]/AT, eyes anicteric, ETT Lungs:    CTAB, breathing comfortably on MV, mild tachypnea, no accessory muscle use CV:         S1S2, RRR Abd:      Soft, NT Ext:    No peripheral edema, minimal muscle mass Skin:        no rashes Neuro:  RASS +4, localizing to ETT, verbally redirectable for only a short time but nods to answer y/n questions   Bicarb 21 BUN 15 Cr 0.99 WBC 12.9 H/H 12.4/38.7 Platelets 273 No recent cultures CXR personally reviewed> ETT 2 cm above carina. No opacities.   Echo: LVEF 60-65%; normal diastolic function. Normal RV. Trivial AI, otherwise normal valves.    Resolved Hospital Problem list     Assessment & Plan:   Acute metabolic encephalopathy Seizures, likely breakthrough in the  setting of alcohol withdrawal History of left PCA infarct -appreciate neurology's management-  cEEG per their recommendations.  -con't Keppra; will need long-term AEDs due to abnormalities on EEG.  -should auto-taper off versed; d/c infusion -MRI brain not yet completed. Not sure yet that she would cooperate for this.   Nausea, vomiting, suspected gastroenteritis -con't supportive care -monitor electrolytes, replete as needed  Hypoglycemia -con't D10w + thiamine supplements -check cortisol from AM labs -advance TF -monitoring for refeeding syndrome   Acute hypoxemic  respiratory failure requiring mechanical ventilation -LTVV -VAP prevention protocol -PAD protocol for sedation -daily SAT & SBT as appropriate; failed today due to severe agitation  Hypotension due to sedation most likely -wean off vasopressors to maintain MAP >65  Tobacco use disorder, presumed COPD Alcohol use disorder; no evidence of cirrhosis on liver US - vitamins - benzos PRN for withdrawal symptoms - will counsel on the importance of quitting when appropriate -bronchodilators PRN  Anxiety and depression - holding home meds- Lexapro  Troponin elevation, most likely due to seizure activity -recheck this morning -tele monitoring  VF Arrest 2015 s/p ICD placement, prolonged QT syndrome -tele monitoring -has AICD -monitor electrolytes -monitor QTc  AKI-resolved HTN -hold PTA antihypertensives -strict I/O -renally dose meds, avoid nephrotoxic meds  Hypokalemia -resolved  Hypothyroidism -con't synthroid  Lactic acidosis most likely due to seizure -no additional monitoring required at this point   Best Practice (right click and "Reselect all SmartList Selections" daily)   Diet/type: NPO DVT prophylaxis: prophylactic heparin  GI prophylaxis: N/A Lines: N/A Foley:  N/A Code Status:  full code Last date of multidisciplinary goals of care discussion [pending]  Labs   CBC: Recent Labs  Lab 12/13/22 2349 12/13/22 2353 12/14/22 0111 12/14/22 0358 12/14/22 0749 12/15/22 0553  WBC 18.7*  --   --  18.6*  --  12.9*  NEUTROABS 15.9*  --   --   --   --   --   HGB 16.0* 17.0* 13.3 14.4 12.6 12.4  HCT 47.9* 50.0* 39.0 42.0 37.0 38.7  MCV 104.1*  --   --  101.0*  --  105.4*  PLT 384  --   --  294  --  273    Basic Metabolic Panel: Recent Labs  Lab 12/13/22 2353 12/14/22 0056 12/14/22 0111 12/14/22 0358 12/14/22 0749 12/14/22 1038 12/14/22 1716 12/15/22 0553  NA 134* 139 138 137 137  --   --  137  K 5.5* 3.1* 3.2* 4.2 4.3  --   --  4.1  CL 105 101   --  104  --   --   --  107  CO2  --  19*  --  23  --   --   --  21*  GLUCOSE 318* 156*  --  102*  --   --   --  100*  BUN 34* 22  --  20  --   --   --  15  CREATININE 1.30* 1.44*  --  1.39*  --   --   --  0.99  CALCIUM  --  9.0  --  8.9  --   --   --  8.4*  MG  --   --   --  2.0  --  1.9 1.7 2.3  PHOS  --   --   --  3.9  --  2.6 2.2* 3.4   GFR: Estimated Creatinine Clearance: 43.6 mL/min (by C-G formula based on SCr of 0.99 mg/dL). Recent  Labs  Lab 12/13/22 2349 12/14/22 0358 12/15/22 0553  PROCALCITON  --  8.97  --   WBC 18.7* 18.6* 12.9*  LATICACIDVEN  --  3.4*  --     Critical care time:      This patient is critically ill with multiple organ system failure which requires frequent high complexity decision making, assessment, support, evaluation, and titration of therapies. This was completed through the application of advanced monitoring technologies and extensive interpretation of multiple databases. During this encounter critical care time was devoted to patient care services described in this note for 40 minutes.  Steffanie Dunn, DO 12/15/22 11:19 AM [facility]  For contact information, see Amion. If no response to pager, please call PCCM consult pager. After hours, 7PM- 7AM, please call Elink.

## 2022-12-16 ENCOUNTER — Inpatient Hospital Stay (HOSPITAL_COMMUNITY): Payer: Medicare Other

## 2022-12-16 DIAGNOSIS — F10939 Alcohol use, unspecified with withdrawal, unspecified: Secondary | ICD-10-CM | POA: Diagnosis not present

## 2022-12-16 DIAGNOSIS — G9341 Metabolic encephalopathy: Secondary | ICD-10-CM

## 2022-12-16 DIAGNOSIS — R569 Unspecified convulsions: Secondary | ICD-10-CM | POA: Diagnosis not present

## 2022-12-16 DIAGNOSIS — J9601 Acute respiratory failure with hypoxia: Secondary | ICD-10-CM | POA: Diagnosis not present

## 2022-12-16 LAB — CBC
HCT: 36 % (ref 36.0–46.0)
Hemoglobin: 12.4 g/dL (ref 12.0–15.0)
MCH: 34.3 pg — ABNORMAL HIGH (ref 26.0–34.0)
MCHC: 34.4 g/dL (ref 30.0–36.0)
MCV: 99.4 fL (ref 80.0–100.0)
Platelets: 235 10*3/uL (ref 150–400)
RBC: 3.62 MIL/uL — ABNORMAL LOW (ref 3.87–5.11)
RDW: 16.1 % — ABNORMAL HIGH (ref 11.5–15.5)
WBC: 10.1 10*3/uL (ref 4.0–10.5)
nRBC: 0 % (ref 0.0–0.2)

## 2022-12-16 LAB — BASIC METABOLIC PANEL
Anion gap: 7 (ref 5–15)
BUN: 6 mg/dL — ABNORMAL LOW (ref 8–23)
CO2: 26 mmol/L (ref 22–32)
Calcium: 8.5 mg/dL — ABNORMAL LOW (ref 8.9–10.3)
Chloride: 106 mmol/L (ref 98–111)
Creatinine, Ser: 0.88 mg/dL (ref 0.44–1.00)
GFR, Estimated: >60 mL/min (ref >60)
Glucose, Bld: 126 mg/dL — ABNORMAL HIGH (ref 70–99)
Potassium: 3.3 mmol/L — ABNORMAL LOW (ref 3.5–5.1)
Sodium: 139 mmol/L (ref 135–145)

## 2022-12-16 LAB — GLUCOSE, CAPILLARY
Glucose-Capillary: 117 mg/dL — ABNORMAL HIGH (ref 70–99)
Glucose-Capillary: 128 mg/dL — ABNORMAL HIGH (ref 70–99)
Glucose-Capillary: 81 mg/dL (ref 70–99)
Glucose-Capillary: 82 mg/dL (ref 70–99)
Glucose-Capillary: 85 mg/dL (ref 70–99)
Glucose-Capillary: 86 mg/dL (ref 70–99)

## 2022-12-16 LAB — TSH: TSH: 1.165 u[IU]/mL (ref 0.350–4.500)

## 2022-12-16 LAB — FOLATE: Folate: 9.3 ng/mL (ref >5.9)

## 2022-12-16 LAB — VITAMIN B12: Vitamin B-12: 370 pg/mL (ref 180–914)

## 2022-12-16 LAB — MAGNESIUM: Magnesium: 1.9 mg/dL (ref 1.7–2.4)

## 2022-12-16 MED ORDER — ORAL CARE MOUTH RINSE: 15.0000 mL | OROMUCOSAL | Status: DC | PRN

## 2022-12-16 MED ORDER — HYDROMORPHONE HCL 1 MG/ML IJ SOLN
1.0000 mg | INTRAMUSCULAR | Status: DC | PRN
  Administered 2022-12-16: 1 mg via INTRAVENOUS
  Filled 2022-12-16: qty 1

## 2022-12-16 MED ORDER — ORAL CARE MOUTH RINSE
15.0000 mL | OROMUCOSAL | Status: DC
  Administered 2022-12-16 (×2): 15 mL via OROMUCOSAL

## 2022-12-16 MED ORDER — LORAZEPAM 2 MG/ML IJ SOLN
0.5000 mg | INTRAMUSCULAR | Status: DC | PRN
  Administered 2022-12-16 – 2022-12-19 (×6): 0.5 mg via INTRAVENOUS
  Filled 2022-12-16 (×7): qty 1

## 2022-12-16 MED ORDER — ETOMIDATE 2 MG/ML IV SOLN
INTRAVENOUS | Status: AC
  Administered 2022-12-16: 10 mg via INTRAVENOUS
  Filled 2022-12-16: qty 20

## 2022-12-16 MED ORDER — KETAMINE HCL 50 MG/5ML IJ SOSY
PREFILLED_SYRINGE | INTRAMUSCULAR | Status: AC
  Filled 2022-12-16: qty 10

## 2022-12-16 MED ORDER — FENTANYL 2500MCG IN NS 250ML (10MCG/ML) PREMIX INFUSION
25.0000 ug/h | INTRAVENOUS | Status: DC
  Administered 2022-12-17: 150 ug/h via INTRAVENOUS
  Administered 2022-12-17: 25 ug/h via INTRAVENOUS
  Administered 2022-12-18: 150 ug/h via INTRAVENOUS
  Filled 2022-12-16 (×4): qty 250

## 2022-12-16 MED ORDER — NALOXONE HCL 0.4 MG/ML IJ SOLN
INTRAMUSCULAR | Status: AC
  Administered 2022-12-16: 0.4 mg via INTRAVENOUS
  Filled 2022-12-16: qty 1

## 2022-12-16 MED ORDER — OLANZAPINE 10 MG IM SOLR: 5.0000 mg | Freq: Four times a day (QID) | INTRAMUSCULAR | Status: DC | PRN

## 2022-12-16 MED ORDER — THIAMINE HCL 100 MG/ML IJ SOLN
500.0000 mg | Freq: Three times a day (TID) | INTRAVENOUS | Status: AC
  Administered 2022-12-16 – 2022-12-18 (×6): 500 mg via INTRAVENOUS
  Filled 2022-12-16 (×6): qty 5

## 2022-12-16 MED ORDER — ROCURONIUM BROMIDE 10 MG/ML (PF) SYRINGE: 50.0000 mg | PREFILLED_SYRINGE | Freq: Once | INTRAVENOUS | Status: AC

## 2022-12-16 MED ORDER — THIAMINE HCL 100 MG/ML IJ SOLN: 100.0000 mg | Freq: Every day | INTRAMUSCULAR | Status: DC

## 2022-12-16 MED ORDER — POLYETHYLENE GLYCOL 3350 17 G PO PACK
17.0000 g | PACK | Freq: Every day | ORAL | Status: DC
  Administered 2022-12-17: 17 g
  Filled 2022-12-16: qty 1

## 2022-12-16 MED ORDER — FENTANYL CITRATE PF 50 MCG/ML IJ SOSY: 25.0000 ug | PREFILLED_SYRINGE | Freq: Once | INTRAMUSCULAR | Status: DC

## 2022-12-16 MED ORDER — PHENOBARBITAL SODIUM 130 MG/ML IJ SOLN
130.0000 mg | Freq: Once | INTRAMUSCULAR | Status: AC
  Administered 2022-12-16: 130 mg via INTRAVENOUS
  Filled 2022-12-16: qty 1

## 2022-12-16 MED ORDER — ROCURONIUM BROMIDE 10 MG/ML (PF) SYRINGE
PREFILLED_SYRINGE | INTRAVENOUS | Status: AC
  Administered 2022-12-16: 50 mg via INTRAVENOUS
  Filled 2022-12-16: qty 10

## 2022-12-16 MED ORDER — NOREPINEPHRINE 4 MG/250ML-% IV SOLN
2.0000 ug/min | INTRAVENOUS | Status: DC
  Administered 2022-12-17: 4 ug/min via INTRAVENOUS
  Filled 2022-12-16: qty 250

## 2022-12-16 MED ORDER — STERILE WATER FOR INJECTION IJ SOLN
INTRAMUSCULAR | Status: AC
  Filled 2022-12-16: qty 10

## 2022-12-16 MED ORDER — SUCCINYLCHOLINE CHLORIDE 200 MG/10ML IV SOSY
PREFILLED_SYRINGE | INTRAVENOUS | Status: AC
  Filled 2022-12-16: qty 10

## 2022-12-16 MED ORDER — FENTANYL CITRATE PF 50 MCG/ML IJ SOSY: 25.0000 ug | PREFILLED_SYRINGE | Freq: Once | INTRAMUSCULAR | Status: AC

## 2022-12-16 MED ORDER — FENTANYL CITRATE PF 50 MCG/ML IJ SOSY
PREFILLED_SYRINGE | INTRAMUSCULAR | Status: AC
  Administered 2022-12-16: 25 ug via INTRAVENOUS
  Filled 2022-12-16: qty 2

## 2022-12-16 MED ORDER — FENTANYL BOLUS VIA INFUSION
25.0000 ug | INTRAVENOUS | Status: DC | PRN
  Administered 2022-12-17: 50 ug via INTRAVENOUS
  Administered 2022-12-18 (×2): 100 ug via INTRAVENOUS

## 2022-12-16 MED ORDER — DOCUSATE SODIUM 50 MG/5ML PO LIQD
100.0000 mg | Freq: Two times a day (BID) | ORAL | Status: DC
  Administered 2022-12-17 (×2): 100 mg
  Filled 2022-12-16 (×2): qty 10

## 2022-12-16 MED ORDER — MIDAZOLAM HCL 2 MG/2ML IJ SOLN
INTRAMUSCULAR | Status: AC
  Filled 2022-12-16: qty 2

## 2022-12-16 MED ORDER — THIAMINE HCL 100 MG/ML IJ SOLN
250.0000 mg | Freq: Every day | INTRAVENOUS | Status: AC
  Administered 2022-12-18 – 2022-12-23 (×6): 250 mg via INTRAVENOUS
  Filled 2022-12-16 (×6): qty 2.5

## 2022-12-16 MED ORDER — POTASSIUM CHLORIDE 10 MEQ/100ML IV SOLN
10.0000 meq | INTRAVENOUS | Status: AC
  Administered 2022-12-16 (×4): 10 meq via INTRAVENOUS
  Filled 2022-12-16 (×3): qty 100

## 2022-12-16 MED ORDER — ETOMIDATE 2 MG/ML IV SOLN: 10.0000 mg | Freq: Once | INTRAVENOUS | Status: AC

## 2022-12-16 NOTE — Progress Notes (Addendum)
eLink Physician-Brief Progress Note Patient Name: ORELIA SPERBECK DOB: [birth year] MRN: [id number]   Date of Service  12/16/2022  HPI/Events of Note  67 year old female with a history of strokes who presented with new onset seizures, agitation, and respiratory failure requiring temporary intubation.  She is currently on phenobarbital taper as well as Precedex, Zyprexa, and as needed Ativan with minimal benefit.  On bedside examination, she remains severely agitated-she is writhing around in bed with mittens in place and restraints.  eICU Interventions  Add on additional one-time dose of phenobarbital 130 mg.   2019 -had some positive response to the phenobarbital but remains tachypneic with increased work of breathing.  Diminished breath sounds in the right upper and lower lobes.  Patient is maintaining saturations, remains agitated, spontaneously breathing, and although in mild distress, difficult to discerne if this is from agitation versus respiratory distress.  Continue observation for now.  Add on morning VBG.  If evidence of hypercapnia, will readdress. 2211 -persistent agitation, maxed out on Precedex with minimal benefit.  Ongoing appearance of discomfort.  No analgesics on board aside from lidocaine patch.  Will attempt Dilaudid 1 mg in the setting of broken ribs.  2314 -patient with persistent agitation suddenly became very dyspneic and lost consciousness during a bath.  Bag mask ventilation was initiated.  Patient recovered saturations in the moderate with assistance.  0.4 mg of Narcan was administered with improvement in mentation back to agitated state.  Ground team requested to bedside evaluation.  Anticipate requirement for intubation.  Will obtain stat chest radiograph.  Intervention Category Minor Interventions: Agitation / anxiety - evaluation and management    12/16/2022, 7:52 PM

## 2022-12-16 NOTE — Progress Notes (Signed)
Subjective: No seizures overnight.  Exam: Vitals:   12/16/22 1000 12/16/22 1005  BP:  (!) 126/113  Pulse: 69 67  Resp: 17 17  Temp:    SpO2: 98% 99%   Gen: In bed, NAD Resp: non-labored breathing, no acute distress Abd: soft, nt  Neuro: MS: confused, in restraints. partially opens eyes to voice, sticks tongue out on command, wiggles toes on command.  CN: Pupils are equal round and reactive, blinks to eyelid stimulation bilaterally Motor: She moves all extremities spontaneously and symmetrically Sensory: Intact to light touch  Pertinent Labs and diagnostics: cEEG overnight: This study showed evidence of potential epileptogenicity arising from the right temporal region and moderate diffuse encephalopathy, nonspecific etiology but likely related to sedation, toxic-metabolic etiology, anoxic/hypoxic brain injury.   Impression: 67 year old female with a history of alcohol abuse as well as previous cortical stroke who was unable to drink her typical amount due to nausea and vomiting after eating bad mushrooms who has had seizure activity.  Seizure likely multifactorial secondary to previous stroke and alcohol withdrawal. No further seizures after Keppra was increased. LTM has now been negative for over 36 hours.  Still significantly encephalopathic which I suspect is likely a combination of withdrawal, delirium and potential post ictal confusion.  Recommendations: 1) continue Keppra to 1 g twice daily 2) discontinue LTM EEG 3) neurology will continue to follow 4) B12, folate, high dose thiamine. 5) MRI brain without contrast when able.  Erick Blinks [facility]  If 7pm- 7am, please page neurology on call as listed in AMION. 12/16/2022  10:24 AM

## 2022-12-16 NOTE — Progress Notes (Signed)
Dr. Chestine Spore was notified of ongoing severe agitation despite precedex and ativan. She wanted the IM Zyprexa given and that was done.

## 2022-12-16 NOTE — Progress Notes (Addendum)
NAME:  KORDELIA REVAK, MRN:  [id number], DOB:  [birth year], LOS: 2 ADMISSION DATE:  12/13/2022, CONSULTATION DATE:  12/16/2022 REFERRING MD:  EDP, CHIEF COMPLAINT:  seizure   History of Present Illness:  67 year old lady very limited care at [facility].  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  Then husband witnessed seizure by the husband with a left gaze.  Tonic-clonic generalized.   Husband did CPR on advise by EMS for 15 min though he felt she did not lose pulse. Had 1 more episode of seizure on arrival via EMS.  In the ER unresponsive and intubated.  Per ED.  Neuro does not think patient has acute stroke    Pertinent  Medical History  Alcohol use disorder Tobacco used disorder Prolonged QT with cardiac arrest s/p ICD HTN SCC of the tongue s/p chemo, radiation Rheumatoid Arthritis  Significant Hospital Events: Including procedures, antibiotic start and stop dates in addition to other pertinent events   8/4 admitted- presented with stroke like symptoms after a seizure. Had been off ETOH due to n/v/d. 8/5 extubated  Interim History / Subjective:  Extubated yesterday, unable to tolerate PO.  IV infiltrated and only 1 IV access. SpO2 reading in the 80's this am and [place] increased to 6 L [place].  Poor waveform and once probe  replaced improved SpO2 readings, currently weaning [place] now. Repeat CXR pending.  Precedex held due to bradycardia  Objective   Blood pressure 104/75, pulse 65, temperature 97.8 F (36.6 C), temperature source Axillary, resp. rate 19, height 5\' 2"  (1.575 m), weight 56 kg, SpO2 (!) 81%.    Vent Mode: PRVC FiO2 (%):  [40 %] 40 % Set Rate:  [18 bmp] 18 bmp Vt Set:  [400 mL] 400 mL PEEP:  [5 cmH20] 5 cmH20   Intake/Output Summary (Last 24 hours) at 12/16/2022 0725 Last data filed at 12/16/2022 0600 Gross per 24 hour  Intake 2407.69 ml  Output 525 ml  Net 1882.69 ml   Filed Weights   12/14/22 0240 12/15/22 0316 12/16/22 0322  Weight: 56 kg 56.3 kg 56 kg    Examination: Gen:   Chronically ill appearing woman lying in bed, recently received IV ativan HEENT:   [place]/AT, eyes anictericT Lungs:    CTAB,  CV:         S1S2, RRR, no edema Abd:      Soft, NT Ext:    No peripheral edema, minimal muscle mass Skin:        no rashes Neuro:  Non eye opening, does not follow commands. Per RN, moving all extremities and confused with one word answers, extremely agitated prior to receiving ativan.    Resolved Hospital Problem list   AKI-resolved  Assessment & Plan:   Acute metabolic encephalopathy Seizures, likely breakthrough in the setting of alcohol withdrawal History of left PCA infarct Right parietal cyst, follows with NSGY -appreciate  neurology's management -remains on cEEG -con't Keppra; will need long-term AEDs due to abnormalities on EEG.  -Has been unable to get MRI Brain due to agitation.  Consider MRI Brain with contrast once able   Nausea, vomiting, suspected gastroenteritis -con't supportive care, no episodes of vomiting overnight -monitor electrolytes, replete as needed  Hypoglycemia -con't D10w + thiamine supplements -am coritsol level 6.9 -No access for tube feeds and NPO  Acute hypoxemic respiratory failure requiring mechanical ventilation -Extubated 8/5 -CXR pending  this am -Wean [place] as tolerated  Hypotension due to sedation most likely -vasopressors were able to be weaned off with cessation of sedation  Tobacco use disorder, presumed COPD Alcohol use disorder; no evidence of cirrhosis on liver US - vitamins - Received PRN zyprexa, ativan, and versed overnight.  - Precedex held for bradycardia overnight, consider re-trialing today.  HR in the 50's overnight with stable hemodynamics.  - Phenobarb taper  - Unable to start enteral/long acting meds.  - will counsel on the importance of quitting when appropriate - bronchodilators PRN  Anxiety and depression - holding home meds- Lexapro  Troponin elevation, most likely due to seizure activity -Troponin 54 yesterday, no ST elevation on EKG.  No further trending indicated  VF Arrest 2015 s/p ICD placement, prolonged QT syndrome -has AICD -monitor electrolytes & QTc  HTN -hold PTA antihypertensives  Hypokalemia -Pending replacement, only has one PIV and no enteral access.    Hypothyroidism -con't synthroid  Lactic acidosis most likely due to seizure -no additional monitoring required at this point  Previous tongue cancer  -Daughter states difficulty swallowing at home, needs formal SLP eval prior to initiating diet.  Best Practice (right click and "Reselect all SmartList Selections" daily)   Diet/type: NPO Will need to consider coretrak placement for nutrition and oral meds. Order placed for tomorrow DVT prophylaxis: prophylactic heparin  GI prophylaxis: N/A Lines: N/A Foley:  N/A Code Status:  full code Last date of multidisciplinary goals of care discussion [updated daughter at bedside this am]  Labs   CBC: Recent Labs  Lab 12/13/22 2349 12/13/22 2353 12/14/22 0358 12/14/22 0749 12/15/22 0553 12/15/22 2214 12/16/22 0402  WBC 18.7*  --  18.6*  --  12.9*  --  10.1  NEUTROABS 15.9*  --   --   --   --   --   --   HGB 16.0*   < > 14.4 12.6 12.4 11.9* 12.4  HCT 47.9*   < > 42.0  37.0 38.7 35.0* 36.0  MCV 104.1*  --  101.0*  --  105.4*  --  99.4  PLT 384  --  294  --  273  --  235   < > = values in this interval not displayed.    Basic Metabolic Panel: Recent Labs  Lab 12/13/22 2353 12/14/22 0056 12/14/22 0111 12/14/22 0358 12/14/22 0749 12/14/22 1038 12/14/22 1716 12/15/22 0553 12/15/22 1644 12/15/22 2214 12/16/22 0402  NA 134* 139   < > 137 137  --   --  137  --  137 139  K 5.5* 3.1*   < > 4.2 4.3  --   --  4.1  --  3.6 3.3*  CL 105 101  --  104  --   --   --  107  --   --  106  CO2  --  19*  --  23  --   --   --  21*  --   --  26  GLUCOSE 318* 156*  --  102*  --   --   --  100*  --   --  126*  BUN 34* 22  --  20  --   --   --  15  --   --  6*  CREATININE 1.30* 1.44*  --  1.39*  --   --   --  0.99  --   --  0.88  CALCIUM  --  9.0  --  8.9  --   --   --  8.4*  --   --  8.5*  MG  --   --    < > 2.0  --  1.9 1.7 2.3 1.9  --  1.9  PHOS  --   --   --  3.9  --  2.6 2.2* 3.4 3.2  --   --    < > = values in this interval not displayed.   GFR: Estimated Creatinine Clearance: 49.1 mL/min (by C-G formula based on SCr of 0.88 mg/dL). Recent Labs  Lab 12/13/22 2349 12/14/22 0358 12/15/22 0553 12/16/22 0402  PROCALCITON  --  8.97  --   --   WBC 18.7* 18.6* 12.9* 10.1  LATICACIDVEN  --  3.4*  --   --     Critical care time:      This patient is critically ill with multiple organ system failure which requires frequent high complexity decision making, assessment, support, evaluation, and titration of therapies. This was completed through the application of advanced monitoring technologies and extensive interpretation of multiple databases. During this encounter critical care time was devoted to patient care services described in this note for 40 minutes.  Cindra Eves, DO 12/16/22 7:25 AM [facility]  For contact information, see Amion. If no response to pager, please call PCCM consult pager. After hours, 7PM- 7AM, please call  Elink.

## 2022-12-16 NOTE — Procedures (Signed)
Orogastric tube placed uneventfully 

## 2022-12-16 NOTE — Progress Notes (Signed)
OT Cancellation Note  Patient Details Name: Krystal Peterson MRN: [id number] DOB: [birth year]   Cancelled Treatment:    Reason Eval/Treat Not Completed: Other (comment) (pt remains restless, restrained, on LTM EEG and RN state not appropriate to attempt mobility yet. OT evaluation to follow up when pt is able to participate)  Donia Pounds 12/16/2022, 9:31 AM

## 2022-12-16 NOTE — Procedures (Signed)
Patient Name: Krystal Peterson  MRN: [id number]  Epilepsy Attending: Jefferson Fuel  Referring Physician/Provider: Dr. Derry Lory Date: 12/16/22 Start time: 8/5 at 0730 End Time: 8/6 at 0730 Duration: 24 hrs  Patient history: 76 year with history of stroke, who is presenting with AMS after a seizure, EEG for further evaluation.   Level of alertness: Intubated and sedated   AEDs during EEG study: Levetiracetam, phenobarb. Sedated on precedex  Technical aspects: This EEG study was done with scalp electrodes positioned according to the 10-20 International system of electrode placement. Electrical activity was reviewed with band pass filter of 1-70Hz , sensitivity of 7 uV/mm, display speed of 64mm/sec with a 60Hz  notched filter applied as appropriate. EEG data were recorded continuously and digitally stored.  Video monitoring was available and reviewed as appropriate.  Description: EEG showed continuous generalized polymorphic sharply contoured 3 to 6 Hz theta-delta slowing. The EEG background was continuous and reactive. There were additional right temporal sharps, Sleep pattern was not seen. There were no electrographic seizures this recording. Hyperventilation and photic stimulation were not performed.     ABNORMALITY - Sharp wave, right temporal region - Continuous slow, generalized   IMPRESSION: This study showed evidence of potential epileptogenicity arising from the right temporal region and moderate diffuse encephalopathy, nonspecific etiology but likely related to sedation, toxic-metabolic etiology, anoxic/hypoxic brain injury.   Jefferson Fuel

## 2022-12-16 NOTE — Progress Notes (Signed)
PT Cancellation Note  Patient Details Name: Krystal Peterson MRN: [id number] DOB: [birth year]   Cancelled Treatment:    Reason Eval/Treat Not Completed: Patient not medically ready (pt remains restless, restrained, on LTM EEG and RN state not appropriate to attempt mobility yet)    B  12/16/2022, 7:47 AM Merryl Hacker, PT Acute Rehabilitation Services Office: [phone]

## 2022-12-16 NOTE — Progress Notes (Signed)
Patient has an unsafe MRI ICD per manufacturer. Patient is unable to have MRI.

## 2022-12-16 NOTE — Progress Notes (Signed)
LTM EEG D/C'd. No skin breakdown noted. [facility] was notified.  

## 2022-12-16 NOTE — Procedures (Signed)
Patient Name: Krystal Peterson  MRN: [id number]  Epilepsy Attending: Jefferson Fuel  Referring Physician/Provider: Dr. Derry Lory Date: 12/16/22 Start time: 8/6 at 0730 End Time: 8/6 at 1153 Duration: 4 hrs 23 min  Patient history: 89 year with history of stroke, who is presenting with AMS after a seizure, EEG for further evaluation.   Level of alertness: Intubated and sedated   AEDs during EEG study: Levetiracetam, phenobarb. Sedated on precedex  Technical aspects: This EEG study was done with scalp electrodes positioned according to the 10-20 International system of electrode placement. Electrical activity was reviewed with band pass filter of 1-70Hz , sensitivity of 7 uV/mm, display speed of 7mm/sec with a 60Hz  notched filter applied as appropriate. EEG data were recorded continuously and digitally stored.  Video monitoring was available and reviewed as appropriate.  Description: EEG showed continuous generalized polymorphic sharply contoured 3 to 6 Hz theta-delta slowing. The EEG background was continuous and reactive. There were additional right temporal sharps, Sleep pattern was not seen. There were no electrographic seizures this recording. Hyperventilation and photic stimulation were not performed.     ABNORMALITY - Sharp wave, right temporal region - Continuous slow, generalized   IMPRESSION: This study showed evidence of potential epileptogenicity arising from the right temporal region and moderate diffuse encephalopathy, nonspecific etiology but likely related to sedation, toxic-metabolic etiology, anoxic/hypoxic brain injury.   Jefferson Fuel

## 2022-12-16 NOTE — Progress Notes (Signed)
Asked to evaluate at bedside  Altered mental status, does not appear to be protecting airway well  Difficulty with agitation management and ability to protect her airway  Decision made to intubate

## 2022-12-16 NOTE — Procedures (Signed)
Intubation Procedure Note  MACKAYLA TAKEDA  [id number]  07/12/1955  Date:12/16/22  Time:11:38 PM   Provider Performing: A     Procedure: Intubation (31500)  Indication(s) Respiratory Failure  Consent Unable to obtain consent due to emergent nature of procedure.   Anesthesia Etomidate, Fentanyl, and Rocuronium 10 etomidate, 25 fentanyl, 50 rocuronium  Time Out Verified patient identification, verified procedure, site/side was marked, verified correct patient position, special equipment/implants available, medications/allergies/relevant history reviewed, required imaging and test results available.   Sterile Technique Usual hand hygeine, masks, and gloves were used   Procedure Description Patient positioned in bed supine.  Sedation given as noted above.  Patient was intubated with endotracheal tube using Glidescope.  View was Grade 1 full glottis .  Number of attempts was 1.  Colorimetric CO2 detector was consistent with tracheal placement.   Complications/Tolerance None; patient tolerated the procedure well. Chest X-ray is ordered to verify placement.   EBL None   Specimen(s) None

## 2022-12-17 ENCOUNTER — Inpatient Hospital Stay (HOSPITAL_COMMUNITY): Payer: Medicare Other

## 2022-12-17 DIAGNOSIS — J690 Pneumonitis due to inhalation of food and vomit: Secondary | ICD-10-CM

## 2022-12-17 DIAGNOSIS — Z9911 Dependence on respirator [ventilator] status: Secondary | ICD-10-CM | POA: Diagnosis not present

## 2022-12-17 DIAGNOSIS — E43 Unspecified severe protein-calorie malnutrition: Secondary | ICD-10-CM | POA: Insufficient documentation

## 2022-12-17 DIAGNOSIS — R569 Unspecified convulsions: Secondary | ICD-10-CM | POA: Diagnosis not present

## 2022-12-17 DIAGNOSIS — F10939 Alcohol use, unspecified with withdrawal, unspecified: Secondary | ICD-10-CM | POA: Diagnosis not present

## 2022-12-17 LAB — CULTURE, RESPIRATORY W GRAM STAIN

## 2022-12-17 LAB — POCT I-STAT 7, (LYTES, BLD GAS, ICA,H+H)
Acid-base deficit: 6 mmol/L — ABNORMAL HIGH (ref 0.0–2.0)
Acid-base deficit: 8 mmol/L — ABNORMAL HIGH (ref 0.0–2.0)
Bicarbonate: 18.7 mmol/L — ABNORMAL LOW (ref 20.0–28.0)
Bicarbonate: 22.6 mmol/L (ref 20.0–28.0)
Calcium, Ion: 1.19 mmol/L (ref 1.15–1.40)
Calcium, Ion: 1.27 mmol/L (ref 1.15–1.40)
HCT: 38 % (ref 36.0–46.0)
HCT: 40 % (ref 36.0–46.0)
Hemoglobin: 12.9 g/dL (ref 12.0–15.0)
Hemoglobin: 13.6 g/dL (ref 12.0–15.0)
O2 Saturation: 92 %
O2 Saturation: 98 %
Patient temperature: 98.5
Potassium: 3.5 mmol/L (ref 3.5–5.1)
Potassium: 3.7 mmol/L (ref 3.5–5.1)
Sodium: 137 mmol/L (ref 135–145)
Sodium: 137 mmol/L (ref 135–145)
TCO2: 20 mmol/L — ABNORMAL LOW (ref 22–32)
TCO2: 25 mmol/L (ref 22–32)
pCO2 arterial: 32.9 mmHg (ref 32–48)
pCO2 arterial: 69.7 mmHg (ref 32–48)
pH, Arterial: 7.118 — CL (ref 7.35–7.45)
pH, Arterial: 7.364 (ref 7.35–7.45)
pO2, Arterial: 140 mmHg — ABNORMAL HIGH (ref 83–108)
pO2, Arterial: 64 mmHg — ABNORMAL LOW (ref 83–108)

## 2022-12-17 LAB — GLUCOSE, CAPILLARY
Glucose-Capillary: 142 mg/dL — ABNORMAL HIGH (ref 70–99)
Glucose-Capillary: 86 mg/dL (ref 70–99)
Glucose-Capillary: 121 mg/dL — ABNORMAL HIGH (ref 70–99)
Glucose-Capillary: 128 mg/dL — ABNORMAL HIGH (ref 70–99)
Glucose-Capillary: 155 mg/dL — ABNORMAL HIGH (ref 70–99)
Glucose-Capillary: 143 mg/dL — ABNORMAL HIGH (ref 70–99)

## 2022-12-17 LAB — PROCALCITONIN: Procalcitonin: 1.04 ng/mL

## 2022-12-17 LAB — LACTIC ACID, PLASMA
Lactic Acid, Venous: 1.3 mmol/L (ref 0.5–1.9)
Lactic Acid, Venous: 1.6 mmol/L (ref 0.5–1.9)

## 2022-12-17 MED ORDER — SODIUM CHLORIDE 0.9 % IV BOLUS
500.0000 mL | Freq: Once | INTRAVENOUS | Status: AC
  Administered 2022-12-17: 500 mL via INTRAVENOUS

## 2022-12-17 MED ORDER — ACETAMINOPHEN 325 MG PO TABS
650.0000 mg | ORAL_TABLET | Freq: Four times a day (QID) | ORAL | Status: DC | PRN
  Administered 2022-12-17 – 2022-12-24 (×3): 650 mg
  Filled 2022-12-17 (×3): qty 2

## 2022-12-17 MED ORDER — OSMOLITE 1.2 CAL PO LIQD
1000.0000 mL | ORAL | Status: DC
  Administered 2022-12-17 – 2022-12-23 (×8): 1000 mL
  Filled 2022-12-17 (×9): qty 1000

## 2022-12-17 MED ORDER — NOREPINEPHRINE 4 MG/250ML-% IV SOLN
0.0000 ug/min | INTRAVENOUS | Status: DC
  Administered 2022-12-17: 8 ug/min via INTRAVENOUS
  Filled 2022-12-17: qty 250

## 2022-12-17 MED ORDER — PROSOURCE TF20 ENFIT COMPATIBL EN LIQD
60.0000 mL | Freq: Every day | ENTERAL | Status: DC
  Administered 2022-12-17 – 2023-01-02 (×17): 60 mL
  Filled 2022-12-17 (×18): qty 60

## 2022-12-17 MED ORDER — VASOPRESSIN 20 UNITS/100 ML INFUSION FOR SHOCK
0.0000 [IU]/min | INTRAVENOUS | Status: DC
  Administered 2022-12-17 – 2022-12-18 (×3): 0.04 [IU]/min via INTRAVENOUS
  Filled 2022-12-17 (×4): qty 100

## 2022-12-17 MED ORDER — POTASSIUM CHLORIDE 20 MEQ PO PACK
40.0000 meq | PACK | Freq: Once | ORAL | Status: AC
  Administered 2022-12-17: 40 meq
  Filled 2022-12-17: qty 2

## 2022-12-17 MED ORDER — PIPERACILLIN-TAZOBACTAM 3.375 G IVPB
3.3750 g | Freq: Three times a day (TID) | INTRAVENOUS | Status: DC
  Administered 2022-12-17 – 2022-12-19 (×7): 3.375 g via INTRAVENOUS
  Filled 2022-12-17 (×7): qty 50

## 2022-12-17 MED ORDER — NALOXONE HCL 0.4 MG/ML IJ SOLN: 0.4000 mg | Freq: Once | INTRAMUSCULAR | Status: AC

## 2022-12-17 MED ORDER — ORAL CARE MOUTH RINSE: 15.0000 mL | OROMUCOSAL | Status: DC | PRN

## 2022-12-17 MED ORDER — MAGNESIUM SULFATE 2 GM/50ML IV SOLN
2.0000 g | INTRAVENOUS | Status: AC
  Administered 2022-12-17 (×2): 2 g via INTRAVENOUS
  Filled 2022-12-17 (×2): qty 50

## 2022-12-17 MED ORDER — VITAL HIGH PROTEIN PO LIQD: 1000.0000 mL | ORAL | Status: DC

## 2022-12-17 MED ORDER — ENOXAPARIN SODIUM 40 MG/0.4ML IJ SOSY
40.0000 mg | PREFILLED_SYRINGE | INTRAMUSCULAR | Status: DC
  Administered 2022-12-17 – 2023-01-03 (×18): 40 mg via SUBCUTANEOUS
  Filled 2022-12-17 (×18): qty 0.4

## 2022-12-17 MED ORDER — SENNOSIDES 8.8 MG/5ML PO SYRP
10.0000 mL | ORAL_SOLUTION | Freq: Every day | ORAL | Status: DC
  Administered 2022-12-17: 10 mL
  Filled 2022-12-17: qty 10

## 2022-12-17 MED ORDER — HYDROCORTISONE SOD SUC (PF) 100 MG IJ SOLR
100.0000 mg | Freq: Two times a day (BID) | INTRAMUSCULAR | Status: DC
  Administered 2022-12-17 – 2022-12-19 (×4): 100 mg via INTRAVENOUS
  Filled 2022-12-17 (×4): qty 2

## 2022-12-17 MED ORDER — NOREPINEPHRINE 16 MG/250ML-% IV SOLN
0.0000 ug/min | INTRAVENOUS | Status: DC
  Administered 2022-12-17: 30 ug/min via INTRAVENOUS
  Administered 2022-12-17: 13 ug/min via INTRAVENOUS
  Filled 2022-12-17 (×2): qty 250

## 2022-12-17 MED ORDER — ORAL CARE MOUTH RINSE
15.0000 mL | OROMUCOSAL | Status: DC
  Administered 2022-12-17 – 2022-12-23 (×74): 15 mL via OROMUCOSAL

## 2022-12-17 MED ORDER — POTASSIUM CHLORIDE 10 MEQ/100ML IV SOLN
10.0000 meq | INTRAVENOUS | Status: AC
  Administered 2022-12-17 (×4): 10 meq via INTRAVENOUS
  Filled 2022-12-17 (×4): qty 100

## 2022-12-17 MED ORDER — PANTOPRAZOLE SODIUM 40 MG IV SOLR
40.0000 mg | INTRAVENOUS | Status: DC
  Administered 2022-12-17 – 2022-12-22 (×6): 40 mg via INTRAVENOUS
  Filled 2022-12-17 (×6): qty 10

## 2022-12-17 NOTE — Progress Notes (Signed)
OT Cancellation Note  Patient Details Name: SASHAE SPAGNOLO MRN: [id number] DOB: [birth year]   Cancelled Treatment:    Reason Eval/Treat Not Completed: Patient not medically ready;Medical issues which prohibited therapy (Pt re-intubated last night 8/6.  Per nurse, HOLD OT today.  Will check back tomorrow and proceed with evaluation as able.)  Donia Pounds 12/17/2022, 9:46 AM

## 2022-12-17 NOTE — Progress Notes (Signed)
PT Cancellation Note  Patient Details Name: Krystal Peterson MRN: [id number] DOB: 1955-08-03   Cancelled Treatment:    Reason Eval/Treat Not Completed: Medical issues which prohibited therapy (Pt re-intubated last night 8/6.  Per nurse, HOLD PT today.  Will check back tomorrow and proceed with evaluation as able.)   Bevelyn Buckles 12/17/2022, 9:05 AM  M,PT Acute Rehab Services [phone]

## 2022-12-17 NOTE — Progress Notes (Signed)
Husband Krystal Peterson updated via phone. All questions answered and concerns addressed.

## 2022-12-17 NOTE — Progress Notes (Signed)
Brief Neuro Update:  Was notifed yesterday that patient unfortunately has a incompatible device and cannot get MRI brain. This is despite the fact that she got an MRI at [facility] in feb 2024. Will get a repeat CT Head routinely to see if there are any new notable structural lesions.Erick Blinks [facility]

## 2022-12-17 NOTE — Progress Notes (Signed)
Subjective: Patient has had no seizure activity overnight.  Was reintubated for respiratory distress.  She was started on antibiotics for presumed pneumonia.  She has gone into V-fib/polymorphic V. tach twice this morning and has been shocked by her ICD.  Exam: Vitals:   12/17/22 0725 12/17/22 0743  BP:    Pulse:    Resp:    Temp:  (!) 103.2 F (39.6 C)  SpO2: 95%    Gen: In bed, NAD Resp: Respirations synchronous with ventilator Abd: soft, nt  Neuro (sedation with Precedex not paused due to instability): Patient does not respond to name or follow commands she will move bilateral lower extremities spontaneously but does not move upper extremities to pinch.  Pupils are equal round and reactive, corneal reflex positive on the right but not on the left, oculocephalic reflex not present, cough present  Pertinent Labs and diagnostics: cEEG 8/6: This study showed evidence of potential epileptogenicity arising from the right temporal region and moderate diffuse encephalopathy, nonspecific etiology but likely related to sedation, toxic-metabolic etiology, anoxic/hypoxic brain injury.   Impression: 67 year old female with a history of alcohol abuse as well as previous cortical stroke who was unable to drink her typical amount due to nausea and vomiting after eating bad mushrooms who has had seizure activity.  Seizure likely multifactorial secondary to previous stroke and alcohol withdrawal. No further seizures after Keppra was increased. LTM has now been negative for over 36 hours.  Still significantly encephalopathic which I suspect is likely a combination of withdrawal, delirium and potential post ictal confusion.  She has been reintubated overnight, and sedation unfortunately could not be paused for exam due to instability and persistent severe agitation.  Continue AEDs, but could consider switching from Keppra to Depakote given persistent agitation.  Will repeat head CT to evaluate for  structural abnormality given that MRI will not be possible.  Recommendations: 1) continue Keppra to 1 g twice daily 2) neurology will continue to follow 3) B12, folate, high dose thiamine. 4) will repeat head CT as ICD not compatible with MRI  Patient seen by NP given by MD, MD to addend note as needed Cortney E Ernestina Columbia , MSN, AGACNP-BC [facility] See Amion for schedule and pager information 12/17/2022 7:54 AM    If 7pm- 7am, please page neurology on call as listed in AMION. 12/17/2022  7:51 AM   NEUROHOSPITALIST ADDENDUM Performed a face to face diagnostic evaluation.   I have reviewed the contents of history and physical exam as documented by PA/ARNP/Resident and agree with above documentation.  I have discussed and formulated the above plan as documented. Edits to the note have been made as needed.  Impression/Key exam findings/Plan: reintubated for respiratory distress and not for seizures. Continue Keppra 1gram BID. If there arise concerns that Keppra could be contributing to agitation, we can consider switching to Depakote.  Unable to get MRI Brain and will therefore get repeat CT Head.  Continue high dose thiamine.  Erick Blinks, MD [facility] [phone]   If 7pm to 7am, please call on call as listed on AMION.

## 2022-12-17 NOTE — Progress Notes (Signed)
Nutrition Follow-up  DOCUMENTATION CODES:   Severe malnutrition in context of chronic illness  INTERVENTION:   Initiate enteral nutrition via Cortrak: - Start Osmolite 1.2 @ 25 ml/hr and titrate by 10 ml every 4 hours to goal rate of 55 ml/hr (1320 ml/day) - PROSource TF20 60 ml daily  Tube feeding regimen at goal rate provides 1664 kcal, 93 grams of protein, and 1082 ml of H2O.  - Continue MVI with minerals, thiamine, and folic acid  NUTRITION DIAGNOSIS:   Severe Malnutrition related to chronic illness (squamous cell carcinoma of the tongue s/p radiation, EtOH abuse) as evidenced by severe fat depletion, severe muscle depletion.  New diagnosis after completion of NFPE  GOAL:   Patient will meet greater than or equal to 90% of their needs  Met via TF at goal  MONITOR:   Vent status, Labs, Weight trends, TF tolerance, I & O's  REASON FOR ASSESSMENT:   Consult Enteral/tube feeding initiation and management  ASSESSMENT:   67 y.o. female presented to the ED with seizures. 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 Pt admitted with seizures.  8/04 - admitted, intubated, TF initiated at 20 ml/hr (was not advanced past trickle rate), D10 gtt added due to hypoglycemia 8/05 - extubated 8/06 - intubated, Cortrak placed (tip gastric)  Discussed pt with RN and during ICU rounds. Pt intubated overnight due to AMS and concern for inability to protect her airway. Consult received for enteral nutrition. OG tube exchanged for Cortrak this morning as pt will require Cortrak post-extubation. NFPE completed. Pt meets criteria for severe malnutrition.  Admit weight: 54.4 kg Current weight: 54.2 kg  Patient is currently intubated on ventilator support Temp (24hrs), Avg:99.6 F (37.6 C), Min:97.5 F (36.4 C), Max:103.2 F (39.6 C)  Drips: Fentanyl Precedex Levophed: 30 mcg/min D10: 20 ml/hr  Medications reviewed and  include: colace, folic acid, SSI every 4 hours, MVI with minerals, IV protonix, phenobarbital taper, miralax, klor-con 40 mEq x 1, senokot, IV thiamine 500 mg every 8 hours, IV magnesium sulfate 2 grams x 2, IV abx, IV KCl 10 mEq x 4  Labs reviewed: creatinine 1.05, WBC 17.3 CBG's: 81-142 x 24 hours  UOP: 1000 ml x 24 hours I/O's: +8.1 L since admit  NUTRITION - FOCUSED PHYSICAL EXAM:  Flowsheet Row Most Recent Value  Orbital Region Severe depletion  Upper Arm Region Moderate depletion  Thoracic and Lumbar Region Severe depletion  Buccal Region Severe depletion  Temple Region Moderate depletion  Clavicle Bone Region Severe depletion  Clavicle and Acromion Bone Region Severe depletion  Scapular Bone Region Moderate depletion  Dorsal Hand Moderate depletion  Patellar Region Moderate depletion  Anterior Thigh Region Severe depletion  Posterior Calf Region Moderate depletion  Edema (RD Assessment) None  Hair Reviewed  Eyes Reviewed  Mouth Reviewed  Skin Reviewed  Nails Reviewed    Diet Order:   Diet Order             Diet NPO time specified  Diet effective now                   EDUCATION NEEDS:   Not appropriate for education at this time  Skin:  Skin Assessment: Reviewed RN Assessment (abrasion to left upper flank)  Last BM:  no documented BM  Height:   Ht Readings from Last 1 Encounters:  12/16/22 5' (1.524 m)    Weight:   Wt Readings from Last 1 Encounters:  12/17/22 54.2 kg    Ideal Body Weight:  50 kg  BMI:  Body mass index is 23.34 kg/m.  Estimated Nutritional Needs:   Kcal:  1600-1800  Protein:  80-90 grams  Fluid:  1.6-1.8 L    Mertie Clause, MS, RD, LDN Registered Dietitian II Please see AMiON for contact information.

## 2022-12-17 NOTE — Procedures (Signed)
Central Venous Catheter Insertion Procedure Note  Krystal Peterson  [id number]  1956/02/19  Date:12/17/22  Time:2:45 AM   Provider Performing: Judie Petit Pecola Leisure   Procedure: Insertion of Non-tunneled Central Venous Catheter(36556) with US guidance (78295)   Indication(s) Medication administration and Difficult access  Consent Unable to obtain consent due to emergent nature of procedure.  Anesthesia Topical only with 1% lidocaine   Timeout Verified patient identification, verified procedure, site/side was marked, verified correct patient position, special equipment/implants available, medications/allergies/relevant history reviewed, required imaging and test results available.  Sterile Technique Maximal sterile technique including full sterile barrier drape, hand hygiene, sterile gown, sterile gloves, mask, hair covering, sterile ultrasound probe cover (if used).  Procedure Description Area of catheter insertion was cleaned with chlorhexidine and draped in sterile fashion.  With real-time ultrasound guidance a central venous catheter was placed into the right femoral vein. Nonpulsatile blood flow and easy flushing noted in all ports.  The catheter was sutured in place and sterile dressing applied.  Complications/Tolerance None; patient tolerated the procedure well. Chest X-ray is ordered to verify placement for internal jugular or subclavian cannulation.   Chest x-ray is not ordered for femoral cannulation.  EBL Minimal  Specimen(s) None  Tim Lair, PA-C [facility] 12/17/22 2:45 AM  Please see [email or web address] for pager details.  From 7A-7P if no response, please call [phone] After hours, please call ELink [phone]

## 2022-12-17 NOTE — Procedures (Signed)
Cortrak  Person Inserting Tube:  ,  L, RD Tube Type:  Cortrak - 43 inches Tube Size:  10 Tube Location:  Left nare Secured by: Bridle Technique Used to Measure Tube Placement:  Marking at nare/corner of mouth Cortrak Secured At:  67 cm  Cortrak Tube Team Note:  Consult received to place a Cortrak feeding tube.   X-ray is required, abdominal x-ray has been ordered by the Cortrak team. Please confirm tube placement before using the Cortrak tube.   If the tube becomes dislodged please keep the tube and contact the Cortrak team at [email or web address] for replacement.  If after hours and replacement cannot be delayed, place a NG tube and confirm placement with an abdominal x-ray.      RD, LDN Clinical Dietitian See AMiON for contact information.    

## 2022-12-17 NOTE — Plan of Care (Signed)
  Problem: Education: Goal: Knowledge of General Education information will improve Description: Including pain rating scale, medication(s)/side effects and non-pharmacologic comfort measures Outcome: Progressing   Problem: Clinical Measurements: Goal: Ability to maintain clinical measurements within normal limits will improve Outcome: Progressing Goal: Diagnostic test results will improve Outcome: Progressing   Problem: Safety: Goal: Non-violent Restraint(s) Outcome: Progressing   Problem: Activity: Goal: Ability to tolerate increased activity will improve Outcome: Progressing

## 2022-12-17 NOTE — Progress Notes (Signed)
ETT advanced from 22 to 24 @ the lip per CCM order. Pt tolerated well and vitals stable.

## 2022-12-17 NOTE — Progress Notes (Signed)
eLink Physician-Brief Progress Note Patient Name: Krystal Peterson DOB: 26-Sep-1955 MRN: [id number]   Date of Service  12/17/2022  HPI/Events of Note   fever of 102.7  eICU Interventions  Add Tylenol.     Intervention Category Minor Interventions: Routine modifications to care plan (e.g. PRN medications for pain, fever)    12/17/2022, 8:00 PM

## 2022-12-17 NOTE — Progress Notes (Signed)
Daughter updated at bedside this afternoon.  Steffanie Dunn, DO 12/17/22 2:23 PM [facility]  For contact information, see Amion. If no response to pager, please call PCCM consult pager. After hours, 7PM- 7AM, please call Elink.

## 2022-12-17 NOTE — Progress Notes (Signed)
Pharmacy ICU Bowel Regimen Consult Note   Current Inpatient Medications for Bowel Management:  Docusate 100 mg liquid twice daily per tube Polyethylene glycol 17g every day per tube  Assessment: Krystal Peterson is a 67 y.o. year old female admitted on 12/13/2022. Constipation identified as acute opioid-induced constipation . Bowel regimen assessment completed by Orvan Falconer, TN on 12/17/22. LBM was PTA.  [x]  Bowel sounds present  [x]  No abdominal tenderness  [x]  Passing gas   Plan: Continue docusate 100 mg liquid twice daily per tube Continue polyethylene glycol 17g every day per tube Start sennosides 10mL syrup every evening per tube   Thank you for allowing pharmacy to participate in this patient's care.   Stephenie Acres, PharmD PGY1 Pharmacy Resident 12/17/2022 8:58 AM

## 2022-12-17 NOTE — Progress Notes (Signed)
NAME:  Krystal Peterson, MRN:  [id number], DOB:  1956-04-25, LOS: 3 ADMISSION DATE:  12/13/2022, CONSULTATION DATE:  12/17/2022 REFERRING MD:  EDP, CHIEF COMPLAINT:  seizure   History of Present Illness:  67 year old lady very limited care at [facility] health.  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  Then husband witnessed seizure by the husband with a left gaze.  Tonic-clonic generalized.   Husband did CPR on advise by EMS for 15 min though he felt she did not lose pulse. Had 1 more episode of seizure on arrival via EMS.  In the ER unresponsive and intubated.  Per ED.  Neuro does not think patient has acute stroke    Pertinent  Medical History  Alcohol use disorder Tobacco used disorder Prolonged QT with cardiac arrest s/p ICD HTN SCC of the tongue s/p chemo, radiation Rheumatoid Arthritis  Significant Hospital Events: Including procedures, antibiotic start and stop dates in addition to other pertinent events   8/4 admitted- presented with stroke like symptoms after a seizure. Had been off ETOH due to n/v/d. 8/5 extubated 8/6 remains agitated but can answer many questions and is periodically directable 8/6-8.7 reintubated overnight, CVC 8/7 polymorphic VT x 2, aborted with AICD shocks. Probably explained by lytes. Qtc  WNL.  Interim History / Subjective:  Reintubated overnight for AMS, concern for increasing WOB. CVC placed overnight. Tmax 103.  Objective   Blood pressure (!) 115/52, pulse 91, temperature (!) 103.2 F (39.6 C), temperature source Oral, resp. rate (!) 25, height 5' (1.524 m), weight 54.2 kg, SpO2 95%.    Vent Mode: PRVC FiO2 (%):  [40 %-100 %] 40 % Set Rate:  [18 bmp-25 bmp] 25 bmp Vt Set:  [360 mL] 360 mL PEEP:  [5 cmH20] 5 cmH20 Plateau Pressure:  [16 cmH20-25 cmH20] 16 cmH20   Intake/Output Summary (Last 24 hours) at 12/17/2022 0752 Last data filed at 12/17/2022 7829 Gross per 24 hour  Intake 2639.82 ml  Output 1000 ml  Net 1639.82 ml   Filed Weights   12/15/22 0316 12/16/22 0322 12/17/22 0500  Weight: 56.3 kg 56 kg 54.2 kg    Examination: Gen:      Critically ill appearing woman lying in bed intubated, sedated HEENT:   [place]/AT, eyes anicteric, ETT in place Lungs:    mild thick gray secretions, minimal rhonchi CV:         S1S2, irreg rhythm, reg rate.  Abd:      Soft, NT Ext:    No significant edema, no cyanosis Skin:        warm, dry, no rashes Neuro:    RASS -4, moves extremities with noxious stimulation, + cough reflex   Telemetry reviewed: polymorphic VT, aborted by AICD shock  7.1/70/140/23  K+ 3.6 Mg+ 1.7 BUN 7 Cr 1.05 PCT 1.04 WBC 17.3 H/H 13.1/39.1 Platelets 295 CXR personally reviewed> RLL opacity, ETT almost 6  cm above carina   Resolved Hospital Problem list   AKI-resolved Nausea, vomiting, suspected gastroenteritis Troponin elevation, most likely due to seizure activity Lactic acidosis most likely due to seizure  Assessment & Plan:   Acute metabolic encephalopathy Seizures, likely due to history of previous stroke and alcohol withdrawal History of left PCA infarct Right parietal cyst, follows with NSGY -appreciate neurology's management> no full SAT today with episodes of VT x 2 this morning -con't keppra; will require long-term AEDs -Cannot  get brain MRI due to AICD incompatibility  Polymorphic VT likely due to low normal K+, Mg+  VF Arrest 2015 s/p ICD placement, prolonged QT syndrome> normal today -con't tele monitoring -K+ & Mg+  -repeat ABG  Hypoglycemia -con't D10-- stop when she no longer needs this rather than adding insulin -TF -cortrak today for once she gets extubated again eventually  Acute metabolic encephalopathy with respiratory compensation -check LA  Acute hypoxemic respiratory failure requiring mechanical ventilation; reintubated after being off about 36 hrs Aspiration pneumonia -trach aspirate culture pending -zosyn started overnight; MRSA nares negative -LTVV -VAP prevention protocol -PAD protocol for sedation; checked Qtc today and it was  Hypotension due to sedation most likely -vasopressors to maintain MAP >65; has CVC now  Tobacco use disorder  Alcohol use disorder; no evidence of cirrhosis on liver US -supplemental vitamins; giving high dose thiamine given neuro symptoms -PAD protocol -ok to con't phenobarb taper -needs counseling on the importance of quitting smoking when appropriate  -bronchodllators as needed  Anxiety and depression -con't holding PTA lexapro  HTN -con't holding PTA antihypertensives   Hypothyroidism -con't synthroid  Previous tongue cancer  -Daughter states difficulty swallowing at home, needs formal SLP eval prior to initiating diet> will be deferred until extubated and able to participate. -cortrak today  Best Practice (right click and "Reselect all SmartList Selections" daily)   Diet/type: tubefeeds  DVT prophylaxis: LMWH GI prophylaxis: PPI Lines: Central line Foley:  N/A Code Status:  full code Last date of multidisciplinary goals of care discussion [updated daughter at bedside 8/6]  Labs   CBC: Recent Labs  Lab 12/13/22 2349 12/13/22 2353 12/14/22 0358 12/14/22 0749 12/15/22 0553 12/15/22 2214 12/16/22 0402 12/17/22 0029  12/17/22 0038  WBC 18.7*  --  18.6*  --  12.9*  --  10.1 17.3*  --   NEUTROABS 15.9*  --   --   --   --   --   --   --   --   HGB 16.0*   < > 14.4   < > 12.4 11.9* 12.4 13.1 13.6  HCT 47.9*   < > 42.0   < > 38.7 35.0* 36.0 39.1 40.0  MCV 104.1*  --  101.0*  --  105.4*  --  99.4 104.8*  --   PLT 384  --  294  --  273  --  235 295  --    < > = values in this interval not displayed.    Basic Metabolic Panel: Recent Labs  Lab 12/14/22 0056 12/14/22 0111 12/14/22 0358 12/14/22 0749 12/14/22 1038 12/14/22 1716 12/15/22 0553 12/15/22 1644 12/15/22 2214 12/16/22 0402 12/17/22 0029 12/17/22 0038  NA 139   < > 137   < >  --   --  137  --  137 139 137 137  K 3.1*   < > 4.2   < >  --   --  4.1  --  3.6 3.3* 3.6 3.5  CL 101  --  104  --   --   --  107  --   --  106 101  --   CO2 19*  --  23  --   --   --  21*  --   --  26 19*  --   GLUCOSE 156*  --  102*  --   --   --  100*  --   --  126* 131*  --   BUN 22  --  20  --   --   --  15  --   --  6* 7*  --   CREATININE 1.44*  --  1.39*  --   --   --  0.99  --   --  0.88 1.05*  --   CALCIUM 9.0  --  8.9  --   --   --  8.4*  --   --  8.5* 8.4*  --   MG  --    < > 2.0  --  1.9 1.7 2.3 1.9  --  1.9 1.7  --   PHOS  --   --  3.9  --  2.6 2.2* 3.4 3.2  --   --   --   --    < > = values in this interval not displayed.   GFR: Estimated Creatinine Clearance: 37.3 mL/min (A) (by C-G formula based on SCr of 1.05 mg/dL (H)). Recent Labs  Lab 12/14/22 0358 12/15/22 0553 12/16/22 0402 12/17/22 0029 12/17/22 0526  PROCALCITON 8.97  --   --   --  1.04  WBC 18.6* 12.9* 10.1 17.3*  --   LATICACIDVEN 3.4*  --   --   --   --     Critical care time:      This patient is critically ill with multiple organ system failure which requires frequent high complexity decision making, assessment, support, evaluation, and titration of therapies. This was completed through the application of advanced monitoring technologies and extensive interpretation of  multiple databases. During this encounter critical care time was devoted to patient care services described in this note for 43 minutes.  Steffanie Dunn, DO 12/17/22 8:31 AM [facility]  For contact information, see Amion. If no response to pager, please call PCCM consult pager. After hours, 7PM- 7AM, please call Elink.

## 2022-12-18 ENCOUNTER — Inpatient Hospital Stay (HOSPITAL_COMMUNITY): Payer: Medicare Other

## 2022-12-18 DIAGNOSIS — R569 Unspecified convulsions: Secondary | ICD-10-CM | POA: Diagnosis not present

## 2022-12-18 LAB — CBC
HCT: 33.1 % — ABNORMAL LOW (ref 36.0–46.0)
Hemoglobin: 11.1 g/dL — ABNORMAL LOW (ref 12.0–15.0)
MCH: 34.4 pg — ABNORMAL HIGH (ref 26.0–34.0)
MCHC: 33.5 g/dL (ref 30.0–36.0)
MCV: 102.5 fL — ABNORMAL HIGH (ref 80.0–100.0)
Platelets: 243 10*3/uL (ref 150–400)
RBC: 3.23 MIL/uL — ABNORMAL LOW (ref 3.87–5.11)
RDW: 16.1 % — ABNORMAL HIGH (ref 11.5–15.5)
WBC: 13 10*3/uL — ABNORMAL HIGH (ref 4.0–10.5)
nRBC: 0 % (ref 0.0–0.2)

## 2022-12-18 LAB — GLUCOSE, CAPILLARY
Glucose-Capillary: 167 mg/dL — ABNORMAL HIGH (ref 70–99)
Glucose-Capillary: 163 mg/dL — ABNORMAL HIGH (ref 70–99)
Glucose-Capillary: 126 mg/dL — ABNORMAL HIGH (ref 70–99)
Glucose-Capillary: 142 mg/dL — ABNORMAL HIGH (ref 70–99)
Glucose-Capillary: 135 mg/dL — ABNORMAL HIGH (ref 70–99)
Glucose-Capillary: 139 mg/dL — ABNORMAL HIGH (ref 70–99)

## 2022-12-18 LAB — BASIC METABOLIC PANEL
Anion gap: 7 (ref 5–15)
BUN: 17 mg/dL (ref 8–23)
CO2: 21 mmol/L — ABNORMAL LOW (ref 22–32)
Calcium: 7.7 mg/dL — ABNORMAL LOW (ref 8.9–10.3)
Chloride: 104 mmol/L (ref 98–111)
Creatinine, Ser: 1.23 mg/dL — ABNORMAL HIGH (ref 0.44–1.00)
GFR, Estimated: 48 mL/min — ABNORMAL LOW (ref >60)
Glucose, Bld: 218 mg/dL — ABNORMAL HIGH (ref 70–99)
Potassium: 4.5 mmol/L (ref 3.5–5.1)
Sodium: 132 mmol/L — ABNORMAL LOW (ref 135–145)

## 2022-12-18 MED ORDER — SENNOSIDES 8.8 MG/5ML PO SYRP
10.0000 mL | ORAL_SOLUTION | Freq: Every day | ORAL | Status: DC
  Administered 2022-12-20 – 2022-12-21 (×2): 10 mL
  Filled 2022-12-18 (×2): qty 10

## 2022-12-18 MED ORDER — POLYETHYLENE GLYCOL 3350 17 G PO PACK: 17.0000 g | PACK | Freq: Two times a day (BID) | ORAL | Status: DC

## 2022-12-18 MED ORDER — POLYETHYLENE GLYCOL 3350 17 G PO PACK: 17.0000 g | PACK | Freq: Every day | ORAL | Status: DC

## 2022-12-18 MED ORDER — SENNOSIDES 8.8 MG/5ML PO SYRP: 10.0000 mL | ORAL_SOLUTION | Freq: Two times a day (BID) | ORAL | Status: DC

## 2022-12-18 NOTE — Progress Notes (Signed)
NAME:  Krystal Peterson, MRN:  [id number], DOB:  [birth year], LOS: 4 ADMISSION DATE:  12/13/2022, CONSULTATION DATE:  12/18/2022 REFERRING MD:  EDP, CHIEF COMPLAINT:  seizure   History of Present Illness:  67 year old lady very limited care at [facility] health.  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  Then husband witnessed seizure by the husband with a left gaze.  Tonic-clonic generalized.   Husband did CPR on advise by EMS for 15 min though he felt she did not lose pulse. Had 1 more episode of seizure on arrival via EMS.  In the ER unresponsive and intubated.  Per ED.  Neuro does not think patient has acute stroke    Pertinent  Medical History  Alcohol use disorder Tobacco used disorder Prolonged QT with cardiac arrest s/p ICD HTN SCC of the tongue s/p chemo, radiation Rheumatoid Arthritis  Significant Hospital Events: Including procedures, antibiotic start and stop dates in addition to other pertinent events   8/4 admitted- presented with stroke like symptoms after a seizure. Had been off ETOH due to n/v/d. 8/5 extubated 8/6 remains agitated but can answer many questions and is periodically directable 8/6-8.7 reintubated overnight, CVC 8/7 polymorphic VT x 2, aborted with AICD shocks. Probably explained by lytes. Qtc  WNL.  Interim History / Subjective:  Febrile overnight. Phenobarbital held last night for encephalopathy. Was on high dose pressors yesterday.   Objective   Blood pressure (!) 87/68, pulse 78, temperature 98.3 F (36.8 C), temperature source Axillary, resp. rate 16, height 5' (1.524 m), weight 55.3 kg, SpO2 95%.    Vent Mode: CPAP;PSV FiO2 (%):  [40 %-50 %] 40 % Set Rate:  [25 bmp-26 bmp] 25 bmp Vt Set:  [360 mL] 360 mL PEEP:  [6 cmH20-8 cmH20] 6 cmH20 Pressure Support:  [12 cmH20] 12 cmH20 Plateau Pressure:  [15 cmH20-17 cmH20] 16 cmH20   Intake/Output Summary (Last 24 hours) at 12/18/2022 0813 Last data filed at 12/18/2022 0655 Gross per 24 hour  Intake 3175.82 ml  Output 1000 ml  Net 2175.82 ml   Filed Weights   12/16/22 0322 12/17/22 0500 12/18/22 0500  Weight: 56 kg 54.2 kg 55.3 kg    Examination: Gen:      Intubated, sedated, acutely and chronicailly ill appearing HEENT:  ETT to vent, copious secretions Lungs:    sounds of mechanical ventilation auscultated, coarse bilateral breath sounds CV:         RRR Abd:      + bowel sounds; soft, non-tender; no palpable masses, no distension Ext:    No edema, right foot second digit cold, not blanching, concerning for digital ischemia Skin:      Warm and dry; no rashes Neuro:   sedated, RASS -3, previously moving all 4 extremities.   Telemetry reviewed: Qtc 400  Na 132 K 4.5 Cr 1.23 WBC 13 Hgb 11.1  CBGs mostly 140-180  Resolved  Hospital Problem list   AKI-resolved Nausea, vomiting, suspected gastroenteritis Troponin elevation, most likely due to seizure activity Lactic acidosis most likely due to seizure  Assessment & Plan:   Acute metabolic encephalopathy Seizures, likely due to history of previous stroke and alcohol withdrawal History of left PCA infarct Right parietal cyst, follows with NSGY -appreciate neurology's management> no full SAT today with episodes of VT x 2 this morning -con't keppra; will require  long-term AEDs -Cannot get brain MRI due to AICD incompatibility - head CT going down today  Polymorphic VT likely due to hypokalemia, hypomagnesemia VF Arrest 2015 s/p ICD placement, prolonged QT syndrome - Qtc checked -  -con't tele monitoring - Qtc -  -K+ & Mg+ (k 4.5, Mg2.5 this morning)  Hypoglycemia - stop D10, tube feeds at goal, cortrak placed.  Acute hypoxemic respiratory failure requiring mechanical ventilation HAP - sputum culture growing GPC in clusters - continue zosyn for now awaiting speciation -LTVV -VAP prevention protocol -PAD protocol for sedation; checked Qtc today and it was  Septic shock secondary to HAP - was on high dose vasopressors, now coming down. -vasopressors to maintain MAP >65; has CVC now  Tobacco use disorder  Alcohol use disorder; no evidence of cirrhosis on liver US -supplemental vitamins; giving high dose thiamine given neuro symptoms -PAD protocol -ok to con't phenobarb taper -needs counseling on the importance of quitting smoking when appropriate  -bronchodllators as needed  Anxiety and depression -con't holding PTA lexapro  HTN -con't holding PTA antihypertensives   Hypothyroidism -con't synthroid  Previous tongue cancer  -Daughter states difficulty swallowing at home, needs formal SLP eval prior to initiating diet> will be deferred until extubated and able to participate. -cortrak today  Best Practice (right click and "Reselect all SmartList Selections" daily)   Diet/type: tubefeeds  DVT prophylaxis: LMWH GI prophylaxis: PPI Lines: Central line Foley:  N/A Code Status:  full code Last date of multidisciplinary goals of care discussion [updated daughter at bedside 8/6. Will  update again today]  Critical care time:     The patient is critically ill due to respiratory failure, encephalopathy.  Critical care was necessary to treat or prevent imminent or life-threatening deterioration.  Critical care was time spent  personally by me on the following activities: development of treatment plan with patient and/or surrogate as well as nursing, discussions with consultants, evaluation of patient's response to treatment, examination of patient, obtaining history from patient or surrogate, ordering and performing treatments and interventions, ordering and review of laboratory studies, ordering and review of radiographic studies, pulse oximetry, re-evaluation of patient's condition and participation in multidisciplinary rounds.   Critical Care Time devoted to patient care services described in this note is 43 minutes. This time reflects time of care of this signee Charlott Holler . This critical care time does not reflect separately billable procedures or procedure time, teaching time or supervisory time of PA/NP/Med student/Med Resident etc but could involve care discussion time.       Charlott Holler  Pulmonary and Critical Care Medicine 12/18/2022 8:13 AM  Pager: see AMION  If no response to pager , please call critical care on call (see AMION) until 7pm After 7:00 pm call Elink

## 2022-12-18 NOTE — Progress Notes (Signed)
RT transported pt from 3M03 to CT and back on ventilator without any complications. RN x 2 and transport at bedside.

## 2022-12-18 NOTE — Progress Notes (Signed)
Subjective: Patient has had an uneventful night.  She was started on phenobarbital yesterday by the primary team and remains on sedation with low dose Precedex.  Agitation has reportedly improved, and patient has not had any further episodes of v-tach.  Exam: Vitals:   12/18/22 0630 12/18/22 0645  BP: 92/63 (!) 87/68  Pulse: 80 78  Resp: 20 16  Temp:    SpO2: 95% 95%   Gen: In bed, NAD Resp: Respirations synchronous with ventilator Abd: soft, nt  Neuro (sedation with low-dose Precedex paused): Patient will open eyes to voice but does not follow commands or track examiner.  She will move all four extremities purposefully but not to command.  Pupils equal, round and sluggishly reactive.  Cough and gag intact.  Pertinent Labs and diagnostics: cEEG 8/6: This study showed evidence of potential epileptogenicity arising from the right temporal region and moderate diffuse encephalopathy, nonspecific etiology but likely related to sedation, toxic-metabolic etiology, anoxic/hypoxic brain injury.   Head CT 8/8: No acute abnormality and no change from prior CT, chronic left PCA infarct and posterior right hemisphere arachnoid cyst.  Impression: 67 year old female with a history of alcohol abuse as well as previous cortical stroke who was unable to drink her typical amount due to nausea and vomiting after eating bad mushrooms who has had seizure activity.  Seizure likely multifactorial secondary to previous stroke and alcohol withdrawal. No further seizures after Keppra was increased. LTM has now been negative for over 36 hours and has been discontinued.  Still significantly encephalopathic which I suspect is likely a combination of withdrawal, delirium and potential post ictal confusion.  She has been reintubated on 8/7.  Exam with sedation paused on 8/8 reveals that patient cannot follow commands but can move all extremities purposefully.  Repeat head CT shows no acute changes.  Suspect that  delirium plays a large part in patient's symptoms at this point.  Will continue Keppra and follow delirium precautions.  Recommendations: 1) continue Keppra 1 g twice daily 2) B12, folate, high dose thiamine. 3) delirium precautions 4) Neurology will sign off, please call with questions or concerns   Patient seen by NP given by MD, MD to addend note as needed Cortney E Ernestina Columbia , MSN, AGACNP-BC [facility] See Amion for schedule and pager information 12/18/2022 7:34 AM    If 7pm- 7am, please page neurology on call as listed in AMION. 12/18/2022  7:34 AM NEUROHOSPITALIST ADDENDUM Performed a face to face diagnostic evaluation.   I have reviewed the contents of history and physical exam as documented by PA/ARNP/Resident and agree with above documentation.  I have discussed and formulated the above plan as documented. Edits to the note have been made as needed.  Erick Blinks, MD [facility] [phone]   If 7pm to 7am, please call on call as listed on AMION.

## 2022-12-18 NOTE — Progress Notes (Signed)
Pharmacy ICU Bowel Regimen Consult Note   Current Inpatient Medications for Bowel Management:  Docusate 100 mg liquid twice daily per tube Polyethylene glycol 17g every day per tube Sennosides 10mL syrup every evening per tube  Assessment: Krystal Peterson is a 67 y.o. year old female admitted on 12/13/2022. Constipation identified as acute opioid-induced constipation . Bowel regimen assessment completed by Orvan Falconer, RN on 12/18/22. LBM was PTA.  [x]  Bowel sounds present  [x]  No abdominal tenderness  [x]  Passing gas   Plan: Discontinue docusate 100 mg liquid twice daily per tube Increase polyethylene glycol 17g to BID per tube Increase sennosides 10mL syrup to BID per tube   Thank you for allowing pharmacy to participate in this patient's care.   Stephenie Acres, PharmD PGY1 Pharmacy Resident 12/18/2022 9:04 AM

## 2022-12-18 NOTE — Progress Notes (Signed)
PT Cancellation Note  Patient Details Name: Krystal Peterson MRN: [id number] DOB: October 22, 1955   Cancelled Treatment:    Reason Eval/Treat Not Completed: Medical issues which prohibited therapy (intubated, agitated, not currently appropriate and discussed with RN and MD)   Enedina Finner  12/18/2022, 7:57 AM Merryl Hacker, PT Acute Rehabilitation Services Office: [phone]

## 2022-12-19 DIAGNOSIS — R569 Unspecified convulsions: Secondary | ICD-10-CM | POA: Diagnosis not present

## 2022-12-19 LAB — GLUCOSE, CAPILLARY
Glucose-Capillary: 117 mg/dL — ABNORMAL HIGH (ref 70–99)
Glucose-Capillary: 134 mg/dL — ABNORMAL HIGH (ref 70–99)
Glucose-Capillary: 105 mg/dL — ABNORMAL HIGH (ref 70–99)
Glucose-Capillary: 108 mg/dL — ABNORMAL HIGH (ref 70–99)
Glucose-Capillary: 129 mg/dL — ABNORMAL HIGH (ref 70–99)
Glucose-Capillary: 82 mg/dL (ref 70–99)

## 2022-12-19 MED ORDER — MIDAZOLAM HCL 2 MG/2ML IJ SOLN
INTRAMUSCULAR | Status: AC
  Filled 2022-12-19: qty 4

## 2022-12-19 MED ORDER — PHENOBARBITAL 32.4 MG PO TABS
64.8000 mg | ORAL_TABLET | Freq: Three times a day (TID) | ORAL | Status: AC
  Administered 2022-12-19 – 2022-12-20 (×3): 64.8 mg
  Filled 2022-12-19 (×3): qty 2

## 2022-12-19 MED ORDER — MIDAZOLAM HCL 2 MG/2ML IJ SOLN
2.0000 mg | INTRAMUSCULAR | Status: DC | PRN
  Administered 2022-12-19 (×2): 2 mg via INTRAVENOUS
  Filled 2022-12-19 (×2): qty 2

## 2022-12-19 MED ORDER — CEFAZOLIN SODIUM-DEXTROSE 2-4 GM/100ML-% IV SOLN
2.0000 g | Freq: Two times a day (BID) | INTRAVENOUS | Status: DC
  Administered 2022-12-19 – 2022-12-20 (×2): 2 g via INTRAVENOUS
  Filled 2022-12-19 (×2): qty 100

## 2022-12-19 MED ORDER — FENTANYL CITRATE PF 50 MCG/ML IJ SOSY
25.0000 ug | PREFILLED_SYRINGE | INTRAMUSCULAR | Status: DC | PRN
  Administered 2022-12-19 – 2022-12-20 (×6): 100 ug via INTRAVENOUS
  Administered 2022-12-20 (×3): 50 ug via INTRAVENOUS
  Administered 2022-12-20: 100 ug via INTRAVENOUS
  Filled 2022-12-19: qty 2
  Filled 2022-12-19: qty 1
  Filled 2022-12-19: qty 2
  Filled 2022-12-19 (×2): qty 1
  Filled 2022-12-19 (×4): qty 2
  Filled 2022-12-19: qty 1
  Filled 2022-12-19: qty 2

## 2022-12-19 MED ORDER — PHENOBARBITAL 32.4 MG PO TABS
32.4000 mg | ORAL_TABLET | Freq: Three times a day (TID) | ORAL | Status: AC
  Administered 2022-12-20 – 2022-12-22 (×6): 32.4 mg
  Filled 2022-12-19 (×6): qty 1

## 2022-12-19 MED ORDER — MIDAZOLAM HCL 2 MG/2ML IJ SOLN
INTRAMUSCULAR | Status: AC
  Filled 2022-12-19: qty 2

## 2022-12-19 MED ORDER — SODIUM PHOSPHATES 45 MMOLE/15ML IV SOLN
15.0000 mmol | Freq: Once | INTRAVENOUS | Status: AC
  Administered 2022-12-19: 15 mmol via INTRAVENOUS
  Filled 2022-12-19: qty 5

## 2022-12-19 MED ORDER — MIDAZOLAM HCL 2 MG/2ML IJ SOLN
4.0000 mg | Freq: Once | INTRAMUSCULAR | Status: AC
  Administered 2022-12-19: 4 mg via INTRAVENOUS

## 2022-12-19 MED ORDER — ESCITALOPRAM OXALATE 10 MG PO TABS
20.0000 mg | ORAL_TABLET | Freq: Every day | ORAL | Status: DC
  Administered 2022-12-19 – 2022-12-29 (×11): 20 mg
  Filled 2022-12-19 (×11): qty 2

## 2022-12-19 MED ORDER — MIDAZOLAM HCL 2 MG/2ML IJ SOLN
2.0000 mg | INTRAMUSCULAR | Status: DC | PRN
  Administered 2022-12-19: 2 mg via INTRAVENOUS

## 2022-12-19 MED ORDER — CLONAZEPAM 1 MG PO TABS
1.0000 mg | ORAL_TABLET | Freq: Two times a day (BID) | ORAL | Status: DC
  Administered 2022-12-19 (×2): 1 mg
  Filled 2022-12-19 (×2): qty 1

## 2022-12-19 NOTE — Progress Notes (Signed)
Discussed plan of care with nursing and family at the bedside. She is increasingly agitated with risk for self harm and interference with care. Restraints - bilateral ankle, wrist, and posey belt ordered. Clonazepam ordered and scheduled enterally.   Discussed progress and goals of care with patient's daughter and sister at bedside. Daughter says that she is from [facility] and is hoping to drive back home for the weekend to take care of some things. She says her mother would not want tracheostomy or longterm critical illness. Acknowledged the challenges her drug use has played in her overall health.   We discussed a tentative plan to medically optimize over the weekend and do a one-way extubation Monday.   Additional cc time 35 minutes.   Durel Salts, MD Pulmonary and Critical Care Medicine [facility] 12/19/2022 1:03 PM Pager: see AMION  If no response to pager, please call critical care on call (see AMION) until 7pm After 7:00 pm call Elink

## 2022-12-19 NOTE — Progress Notes (Signed)
NAME:  Krystal Peterson, MRN:  [id number], DOB:  [birth year], LOS: 5 ADMISSION DATE:  12/13/2022, CONSULTATION DATE:  12/19/2022 REFERRING MD:  EDP, CHIEF COMPLAINT:  seizure   History of Present Illness:  67 year old lady very limited care at [facility].  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  Then husband witnessed seizure by the husband with a left gaze.  Tonic-clonic generalized.   Husband did CPR on advise by EMS for 15 min though he felt she did not lose pulse. Had 1 more episode of seizure on arrival via EMS.  In the ER unresponsive and intubated.  Per ED.  Neuro does not think patient has acute stroke    Pertinent  Medical History  Alcohol use disorder Tobacco used disorder Prolonged QT with cardiac arrest s/p ICD HTN SCC of the tongue s/p chemo, radiation Rheumatoid Arthritis  Significant Hospital Events: Including procedures, antibiotic start and stop dates in addition to other pertinent events   8/4 admitted- presented with stroke like symptoms after a seizure. Had been off ETOH due to n/v/d. 8/5 extubated 8/6 remains agitated but can answer many questions and is periodically directable 8/6-8.7 reintubated overnight, CVC 8/7 polymorphic VT x 2, aborted with AICD shocks. Probably explained by lytes. Qtc  WNL.  Interim History / Subjective:  Off pressors. Continues to have agitated delirium. Failing SBT for apnea, but was on fentanyl.   Objective   Blood pressure 102/71, pulse 66, temperature 97.6 F (36.4 C), temperature source Axillary, resp. rate (!) 22, height 5' 1.89" (1.572 m), weight 59.3 kg, SpO2 99%.    Vent Mode: PRVC FiO2 (%):  [40 %] 40 % Set Rate:  [25 bmp] 25 bmp Vt Set:  [360 mL] 360 mL PEEP:  [5 cmH20] 5 cmH20 Pressure Support:  [10 cmH20] 10 cmH20 Plateau Pressure:  [10 cmH20-17 cmH20] 14 cmH20   Intake/Output Summary (Last 24 hours) at 12/19/2022 0915 Last data filed at 12/19/2022 0700 Gross per 24 hour  Intake 2311.83 ml  Output 1225 ml  Net 1086.83 ml   Filed Weights   12/17/22 0500 12/18/22 0500 12/19/22 0500  Weight: 54.2 kg 55.3 kg 59.3 kg    Examination: Gen:      Intubated, sedated, acutely and chronically ill appearing HEENT:  ETT to vent, copious secretions Lungs:    sounds of mechanical ventilation auscultated, intermittent rhonchi CV:         RRR Abd:      + bowel sounds; soft, non-tender; no palpable masses, no distension Ext:    No edema Skin:      Warm and dry; no rashes Neuro:   sedated, RASS -2 to +2, moves all 4 extremities   Telemetry reviewed: Qtc 450  Na 140 K 4.1 Cr 10.5 Phos 2.1  WBC 19.0 Hgb 10.5 Platelets 244   Resolved Hospital Problem list   AKI-resolved Nausea, vomiting, suspected gastroenteritis Troponin  elevation, most likely due to seizure activity Lactic acidosis most likely due to seizure Hypoglycemia Septic shock  Assessment & Plan:   Acute metabolic encephalopathy Seizures, likely due to history of previous stroke and alcohol withdrawal History of left PCA infarct Right parietal cyst, follows with NSGY -appreciate neurology's management> no full SAT today with episodes of VT x 2 this morning -con't keppra; will require long-term AEDs -Cannot get brain MRI due to AICD incompatibility - head CT 8/8  reviewed - stable appearance with left PCA infarct which is chronic.  - stop fentanyl gtt, continue prns  - continue precedex - continue phenobarbital  Polymorphic VT likely due to hypokalemia, hypomagnesemia VF Arrest 2015 s/p ICD placement, prolonged QT syndrome - Qtc checked -  -con't tele monitoring - Qtc -  -K goal >4, mg goal>2  Acute hypoxemic respiratory failure requiring mechanical ventilation HAP - sputum culture growing GPC in clusters - suspect MSSA given mrsa swab negative.  Hopefully can narrow the zosyn -LTVV -VAP prevention protocol -PAD protocol for sedation;   Septic shock secondary to HAP - was on high dose vasopressors, now off - stop stress dose steroids - suspect leukocytosis secondary to stress dose steroid use.   Tobacco use disorder  Alcohol use disorder; no evidence of cirrhosis on liver US -supplemental vitamins; giving high dose thiamine given neuro symptoms -PAD protocol -ok to con't phenobarb taper -needs counseling on the importance of quitting smoking when appropriate  -bronchodllators as needed  Anxiety and depression - will add back her home lexapro  HTN -con't holding PTA antihypertensives   Hypothyroidism -con't synthroid  Previous tongue cancer  -Daughter states difficulty swallowing at home, needs formal SLP eval prior to initiating diet> will be deferred until extubated and able to participate. -cortrak today  Best Practice (right click and "Reselect all SmartList Selections" daily)   Diet/type: tubefeeds  DVT prophylaxis: LMWH GI prophylaxis: PPI Lines: Central line Foley:  N/A Code Status:  full code Last date of multidisciplinary goals of care discussion [updated daughter at bedside 8/8.]  Critical care time:     The patient is critically ill due to encephalopathy, respiratory failure.  Critical care was necessary to treat or prevent imminent or life-threatening deterioration.  Critical care was time spent personally  by me on the following activities: development of treatment plan with patient and/or surrogate as well as nursing, discussions with consultants, evaluation of patient's response to treatment, examination of patient, obtaining history from patient or surrogate, ordering and performing treatments and interventions, ordering and review of laboratory studies, ordering and review of radiographic studies, pulse oximetry, re-evaluation of patient's condition and participation in multidisciplinary rounds.   Critical Care Time devoted to patient care services described in this note is 42 minutes. This time reflects time of care of this signee Charlott Holler . This critical care time does not reflect separately billable procedures or procedure time, teaching time or supervisory time of PA/NP/Med student/Med Resident etc but could involve care discussion time.       Charlott Holler [facility] 12/19/2022 9:15 AM  Pager: see AMION  If no response to pager , please call critical care on call (see AMION) until 7pm After 7:00 pm call Elink

## 2022-12-19 NOTE — Progress Notes (Signed)
PT Cancellation Note  Patient Details Name: LORANA BIDDLE MRN: [id number] DOB: 06-17-1955   Cancelled Treatment:    Reason Eval/Treat Not Completed: Medical issues which prohibited therapy (pt remains intubated, restless, agitated and not yet appropriate. Will hold til 8/12 for appropriateness vs sign off, discussed with MD and RN)   Enedina Finner  12/19/2022, 8:50 AM Merryl Hacker, PT Acute Rehabilitation Services Office: [phone]

## 2022-12-19 NOTE — Plan of Care (Signed)
  Problem: Education: Goal: Knowledge of General Education information will improve Description: Including pain rating scale, medication(s)/side effects and non-pharmacologic comfort measures Outcome: Progressing   Problem: Clinical Measurements: Goal: Ability to maintain clinical measurements within normal limits will improve Outcome: Progressing Goal: Will remain free from infection Outcome: Progressing   Problem: Nutrition: Goal: Adequate nutrition will be maintained Outcome: Progressing   Problem: Elimination: Goal: Will not experience complications related to bowel motility Outcome: Progressing   Problem: Pain Managment: Goal: General experience of comfort will improve Outcome: Progressing   Problem: Safety: Goal: Ability to remain free from injury will improve Outcome: Progressing   Problem: Skin Integrity: Goal: Risk for impaired skin integrity will decrease Outcome: Progressing

## 2022-12-19 NOTE — Progress Notes (Addendum)
eLink Physician-Brief Progress Note Patient Name: Krystal Peterson DOB: [birth year] MRN: [id number]   Date of Service  12/19/2022  HPI/Events of Note  Patient remains severely agitated trying to prone herself well ventilated on sedation, phenobarbital and antipsychotics.  Needs to be in and out caths and bathed.  Unable to perform basic tasks  eICU Interventions  Versed 4 mg once     Intervention Category Major Interventions: Change in mental status - evaluation and management    12/19/2022, 4:47 AM

## 2022-12-19 NOTE — Progress Notes (Signed)
OT Cancellation Note  Patient Details Name: OTTO EADER MRN: [id number] DOB: [birth year]   Cancelled Treatment:    Reason Eval/Treat Not Completed: Medical issues which prohibited therapy (pt remains intubated, restless, agitated and not yet appropriate. Will hold til 8/12 for appropriateness vs sign off, discussed with MD and RN)  Tyler Deis, OTR/L [facility] Acute Rehabilitation Office: [phone]   Myrla Halsted 12/19/2022, 2:21 PM

## 2022-12-19 NOTE — TOC Progression Note (Addendum)
Transition of Care [facility]) - Progression Note    Patient Details  Name: Krystal Peterson MRN: [id number] Date of Birth: 1955-05-22  Transition of Care [facility]) CM/SW Contact  Tom-Johnson, Hershal Coria, RN Phone Number: 12/19/2022, 3:40 PM  Clinical Narrative:     Patient was reintubated on 12/16/22. Currently Intubated and Sedated. On Bilateral Wrist, Ankle, and Posey Belt for risk to self harm and care interference. GOC meeting with daughter, no Janina Mayo or long term illness. Tentative plan for one-way extubation on Monday 12/22/22.   CM will continue to follow and render support at this time.        Expected Discharge Plan and Services                                               Social Determinants of Health (SDOH) Interventions SDOH Screenings   Food Insecurity: Low Risk  (11/14/2022)   Received from [facility]  Utilities: Low Risk  (11/14/2022)   Received from [facility]  Tobacco Use: Medium Risk (09/04/2022)   Received from [facility]    Readmission Risk Interventions    12/15/2022    3:43 PM  Readmission Risk Prevention Plan  Post Dischage Appt Complete  Medication Screening Complete  Transportation Screening Complete

## 2022-12-19 NOTE — Progress Notes (Signed)
Pharmacy Electrolyte Replacement  Recent Labs:  Recent Labs    12/19/22 0458  K 4.1  MG 2.1  PHOS 2.1*  CREATININE 1.05*    Low Critical Values (K </= 2.5, Phos </= 1, Mg </= 1) Present: None  MD Contacted: N/a  Plan: Give NaPhos IV x1 Recheck Phos with AM labs   Stephenie Acres, PharmD PGY1 Pharmacy Resident 12/19/2022 9:46 AM

## 2022-12-19 NOTE — Progress Notes (Signed)
eLink Physician-Brief Progress Note Patient Name: Krystal Peterson DOB: 1955/07/30 MRN: [id number]   Date of Service  12/19/2022  HPI/Events of Note  Bedside RN states that patient is very agitated on current sedation regimen.  Fentanyl gtt discontinued today due to apneic episodes on SBT. Still receiving prn Fentanyl. Currently seen agitated on 5 pt restraints and max 1.5 of Precedex. Also receiving prn Ativan 0.5 mg. Unable to give Haldol or Geodon nor Seroquel due to prolonged QT.  eICU Interventions  Shift Ativan to Versed 2 mg every 2 hours prn. Appears to have calmed down when Versed was pushed. Discussed with BSRN.        Darl Pikes 12/19/2022, 7:40 PM

## 2022-12-20 ENCOUNTER — Inpatient Hospital Stay (HOSPITAL_COMMUNITY): Payer: Medicare Other

## 2022-12-20 DIAGNOSIS — R569 Unspecified convulsions: Secondary | ICD-10-CM | POA: Diagnosis not present

## 2022-12-20 DIAGNOSIS — E43 Unspecified severe protein-calorie malnutrition: Secondary | ICD-10-CM

## 2022-12-20 DIAGNOSIS — J9601 Acute respiratory failure with hypoxia: Secondary | ICD-10-CM

## 2022-12-20 LAB — POCT I-STAT 7, (LYTES, BLD GAS, ICA,H+H)
Acid-Base Excess: 0 mmol/L (ref 0.0–2.0)
Bicarbonate: 27.2 mmol/L (ref 20.0–28.0)
Calcium, Ion: 1.21 mmol/L (ref 1.15–1.40)
HCT: 35 % — ABNORMAL LOW (ref 36.0–46.0)
Hemoglobin: 11.9 g/dL — ABNORMAL LOW (ref 12.0–15.0)
O2 Saturation: 87 %
Patient temperature: 98.6
Potassium: 3.5 mmol/L (ref 3.5–5.1)
Sodium: 142 mmol/L (ref 135–145)
TCO2: 29 mmol/L (ref 22–32)
pCO2 arterial: 55.6 mmHg — ABNORMAL HIGH (ref 32–48)
pH, Arterial: 7.296 — ABNORMAL LOW (ref 7.35–7.45)
pO2, Arterial: 59 mmHg — ABNORMAL LOW (ref 83–108)

## 2022-12-20 LAB — GLUCOSE, CAPILLARY
Glucose-Capillary: 110 mg/dL — ABNORMAL HIGH (ref 70–99)
Glucose-Capillary: 108 mg/dL — ABNORMAL HIGH (ref 70–99)
Glucose-Capillary: 80 mg/dL (ref 70–99)
Glucose-Capillary: 93 mg/dL (ref 70–99)
Glucose-Capillary: 67 mg/dL — ABNORMAL LOW (ref 70–99)
Glucose-Capillary: 100 mg/dL — ABNORMAL HIGH (ref 70–99)
Glucose-Capillary: 97 mg/dL (ref 70–99)

## 2022-12-20 LAB — PHOSPHORUS: Phosphorus: 5 mg/dL — ABNORMAL HIGH (ref 2.5–4.6)

## 2022-12-20 MED ORDER — ARFORMOTEROL TARTRATE 15 MCG/2ML IN NEBU
15.0000 ug | INHALATION_SOLUTION | Freq: Two times a day (BID) | RESPIRATORY_TRACT | Status: DC
  Administered 2022-12-20 – 2023-01-03 (×28): 15 ug via RESPIRATORY_TRACT
  Filled 2022-12-20 (×28): qty 2

## 2022-12-20 MED ORDER — CEFAZOLIN SODIUM-DEXTROSE 2-4 GM/100ML-% IV SOLN
2.0000 g | Freq: Three times a day (TID) | INTRAVENOUS | Status: AC
  Administered 2022-12-20 – 2022-12-25 (×16): 2 g via INTRAVENOUS
  Filled 2022-12-20 (×16): qty 100

## 2022-12-20 MED ORDER — ROCURONIUM BROMIDE 10 MG/ML (PF) SYRINGE
PREFILLED_SYRINGE | INTRAVENOUS | Status: AC
  Administered 2022-12-20: 100 mg via INTRAVENOUS
  Filled 2022-12-20: qty 10

## 2022-12-20 MED ORDER — CLONAZEPAM 1 MG PO TABS
1.0000 mg | ORAL_TABLET | Freq: Three times a day (TID) | ORAL | Status: DC
  Administered 2022-12-20 – 2022-12-21 (×4): 1 mg
  Filled 2022-12-20 (×4): qty 1

## 2022-12-20 MED ORDER — SODIUM CHLORIDE 3 % IN NEBU
4.0000 mL | INHALATION_SOLUTION | Freq: Every day | RESPIRATORY_TRACT | Status: AC
  Administered 2022-12-20 – 2022-12-22 (×3): 4 mL via RESPIRATORY_TRACT
  Filled 2022-12-20 (×3): qty 15

## 2022-12-20 MED ORDER — OXYCODONE HCL 5 MG PO TABS
5.0000 mg | ORAL_TABLET | Freq: Four times a day (QID) | ORAL | Status: DC
  Administered 2022-12-20 – 2022-12-23 (×12): 5 mg
  Filled 2022-12-20 (×12): qty 1

## 2022-12-20 MED ORDER — GUAIFENESIN 100 MG/5ML PO LIQD
15.0000 mL | Freq: Four times a day (QID) | ORAL | Status: DC
  Administered 2022-12-20 – 2022-12-29 (×36): 15 mL
  Filled 2022-12-20 (×36): qty 15

## 2022-12-20 MED ORDER — DOCUSATE SODIUM 50 MG/5ML PO LIQD
100.0000 mg | Freq: Two times a day (BID) | ORAL | Status: DC
  Administered 2022-12-20 – 2022-12-21 (×3): 100 mg
  Filled 2022-12-20 (×3): qty 10

## 2022-12-20 MED ORDER — PROPOFOL 1000 MG/100ML IV EMUL
INTRAVENOUS | Status: AC
  Filled 2022-12-20: qty 100

## 2022-12-20 MED ORDER — DEXTROSE 50 % IV SOLN
INTRAVENOUS | Status: AC
  Filled 2022-12-20: qty 50

## 2022-12-20 MED ORDER — BISACODYL 10 MG RE SUPP: 10.0000 mg | Freq: Every day | RECTAL | Status: DC | PRN

## 2022-12-20 MED ORDER — REVEFENACIN 175 MCG/3ML IN SOLN
175.0000 ug | Freq: Every day | RESPIRATORY_TRACT | Status: DC
  Administered 2022-12-20 – 2023-01-03 (×14): 175 ug via RESPIRATORY_TRACT
  Filled 2022-12-20 (×14): qty 3

## 2022-12-20 MED ORDER — PROPOFOL 1000 MG/100ML IV EMUL
5.0000 ug/kg/min | INTRAVENOUS | Status: DC
  Administered 2022-12-20: 45 ug/kg/min via INTRAVENOUS
  Administered 2022-12-20: 25 ug/kg/min via INTRAVENOUS
  Administered 2022-12-20: 5 ug/kg/min via INTRAVENOUS
  Administered 2022-12-20: 35 ug/kg/min via INTRAVENOUS
  Administered 2022-12-21: 40 ug/kg/min via INTRAVENOUS
  Administered 2022-12-21: 55 ug/kg/min via INTRAVENOUS
  Filled 2022-12-20 (×6): qty 100

## 2022-12-20 MED ORDER — ROCURONIUM BROMIDE 50 MG/5ML IV SOLN: 100.0000 mg | Freq: Once | INTRAVENOUS | Status: AC

## 2022-12-20 MED ORDER — ALBUTEROL SULFATE (2.5 MG/3ML) 0.083% IN NEBU: 2.5000 mg | INHALATION_SOLUTION | RESPIRATORY_TRACT | Status: DC | PRN

## 2022-12-20 MED ORDER — BUDESONIDE 0.5 MG/2ML IN SUSP
0.5000 mg | Freq: Two times a day (BID) | RESPIRATORY_TRACT | Status: DC
  Administered 2022-12-20 – 2023-01-03 (×26): 0.5 mg via RESPIRATORY_TRACT
  Filled 2022-12-20 (×27): qty 2

## 2022-12-20 MED ORDER — POLYETHYLENE GLYCOL 3350 17 G PO PACK
17.0000 g | PACK | Freq: Every day | ORAL | Status: DC
  Administered 2022-12-20 – 2022-12-21 (×2): 17 g
  Filled 2022-12-20 (×2): qty 1

## 2022-12-20 MED ORDER — NOREPINEPHRINE 4 MG/250ML-% IV SOLN
0.0000 ug/min | INTRAVENOUS | Status: DC
  Administered 2022-12-20: 2 ug/min via INTRAVENOUS
  Filled 2022-12-20: qty 250

## 2022-12-20 MED ORDER — POTASSIUM PHOSPHATES 15 MMOLE/5ML IV SOLN
45.0000 mmol | Freq: Once | INTRAVENOUS | Status: AC
  Administered 2022-12-20: 45 mmol via INTRAVENOUS
  Filled 2022-12-20: qty 15

## 2022-12-20 MED ORDER — ETOMIDATE 2 MG/ML IV SOLN: 20.0000 mg | Freq: Once | INTRAVENOUS | Status: AC

## 2022-12-20 MED ORDER — MAGNESIUM SULFATE 2 GM/50ML IV SOLN
2.0000 g | Freq: Once | INTRAVENOUS | Status: AC
  Administered 2022-12-20: 2 g via INTRAVENOUS
  Filled 2022-12-20: qty 50

## 2022-12-20 MED ORDER — ETOMIDATE 2 MG/ML IV SOLN
INTRAVENOUS | Status: AC
  Administered 2022-12-20: 20 mg via INTRAVENOUS
  Filled 2022-12-20: qty 10

## 2022-12-20 MED ORDER — MIDAZOLAM HCL 2 MG/2ML IJ SOLN: 1.0000 mg | INTRAMUSCULAR | Status: DC | PRN

## 2022-12-20 MED ORDER — DEXTROSE 50 % IV SOLN
12.5000 g | INTRAVENOUS | Status: AC
  Administered 2022-12-20: 12.5 g via INTRAVENOUS

## 2022-12-20 MED ORDER — PHENYLEPHRINE 80 MCG/ML (10ML) SYRINGE FOR IV PUSH (FOR BLOOD PRESSURE SUPPORT)
PREFILLED_SYRINGE | INTRAVENOUS | Status: AC
  Filled 2022-12-20: qty 10

## 2022-12-20 NOTE — Progress Notes (Signed)
[facility] ADULT ICU REPLACEMENT PROTOCOL   The patient does apply for the [facility] Adult ICU Electrolyte Replacment Protocol based on the criteria listed below:   1.Exclusion criteria: TCTS, ECMO, Dialysis, and Myasthenia Gravis patients 2. Is GFR >/= 30 ml/min? Yes.    Patient's GFR today is 53 3. Is SCr </= 2? Yes.   Patient's SCr is 1.13 mg/dL 4. Did SCr increase >/= 0.5 in 24 hours? No. 5.Pt's weight >40kg  Yes.   6. Abnormal electrolyte(s): Potassium 3.6, Phos 1.4, Mg 1.9  7. Electrolytes replaced per protocol 8.  Call MD STAT for K+ </= 2.5, Phos </= 1, or Mag </= 1 Physician:  Dr. Claudette Stapler A  12/20/2022 2:49 AM

## 2022-12-20 NOTE — Progress Notes (Signed)
CPT held at this time due to pt agitation

## 2022-12-20 NOTE — Progress Notes (Signed)
NAME:  Krystal Peterson, MRN:  [id number], DOB:  1955/08/23, LOS: 6 ADMISSION DATE:  12/13/2022, CONSULTATION DATE:  12/20/2022 REFERRING MD:  EDP, CHIEF COMPLAINT:  seizure   History of Present Illness:  67 year old lady very limited care at [facility].  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  Then husband witnessed seizure by the husband with a left gaze.  Tonic-clonic generalized.   Husband did CPR on advise by EMS for 15 min though he felt she did not lose pulse. Had 1 more episode of seizure on arrival via EMS.  In the ER unresponsive and intubated.  Per ED.  Neuro does not think patient has acute stroke    Pertinent  Medical History  Alcohol use disorder Tobacco used disorder Prolonged QT with cardiac arrest s/p ICD HTN SCC of the tongue s/p chemo, radiation Rheumatoid Arthritis  Significant Hospital Events: Including procedures, antibiotic start and stop dates in addition to other pertinent events   8/4 admitted- presented with stroke like symptoms after a seizure. Had been off ETOH due to n/v/d. 8/5 extubated 8/6 remains agitated but can answer many questions and is periodically directable 8/6-8.7 reintubated overnight, CVC 8/7 polymorphic VT x 2, aborted with AICD shocks. Probably explained by lytes. Qtc WNL. 8/9  ongoing agitation, failed SBT on fent gtt> stopped, precedex> propofol  Interim History / Subjective:  -severe ongoing agitation in 5 pt restraints overnight despite, changed over from precedex to propofol gtt, with PRN fentanyl  - some intermittent hypotension overnight, since resolved - thick copious secretions. Was on PSV overnight, tolerated better than PRVC, flipped back at 4am - afebrile/ WBC decreasing - pt is following some intermittent commands this am despite restlessness in bed, nods yes to pain, and points to abd.  Last BM 8/8, type 6  Objective   Blood pressure (!) 73/55, pulse (!) 58, temperature 98.7 F (37.1 C), temperature source Oral, resp. rate (!) 23, height 5' 1.89" (1.572 m), weight 60 kg, SpO2 99%.    Vent Mode: PRVC FiO2 (%):  [40 %] 40 % Set Rate:  [25 bmp] 25 bmp Vt Set:  [360 mL] 360 mL PEEP:  [5 cmH20] 5 cmH20 Pressure Support:  [10 cmH20] 10 cmH20 Plateau Pressure:  [14 cmH20-16 cmH20] 16 cmH20   Intake/Output Summary (Last 24 hours) at 12/20/2022 0739 Last data filed at 12/20/2022 0600 Gross per 24 hour  Intake 2652.06 ml  Output 2525 ml  Net 127.06 ml   Filed Weights   12/18/22 0500 12/19/22 0500 12/20/22 0428  Weight: 55.3 kg 59.3 kg 60 kg    Examination: Propofol 35 General:  AoC ill appearing elderly thin elderly female restless in bed, currently in 4 pt soft wrist, posey and mittens HEENT: MM pink/moist, ETT, cortrak, pupils 4/r, anicteric  Neuro:  opens eyes to verbal, follows intermittent simple commands in all extremities, MAE spont CV: rr, NSR, no  murmur PULM:  dyssynchronous on PRVC with airtrapping, breathing over set rate, better on PSV 10/5, coarse, intermittent wheeze, moderate amount of dark/ tan secretions, few plugs GI: soft (when not using accessory muscles), +bs, no obvious tenderness on palpation, foley - cyu  Extremities: warm/dry, no LE edema  Skin: no rashes, some abrasions to ankles with mepliex worn off at restraints with  agitation  EKG> Qtc 420 Glucose range 82- 110 Afebrile UOP 2.5L/ 24hrs Net +11L inaccurate> multiple unmeasured urinary occurrences prior   Telemetry reviewed: Qtc 450  Na 140 K 3.6 Cr 1.05> 1.13, BUN 17 Phos 2.1> 1.4 Mag 1.9 WBC 19.0> 16.1 Hgb 10.5> 9.8   Resolved Hospital Problem list   AKI-resolved Nausea, vomiting, suspected gastroenteritis Troponin elevation, most likely due to seizure activity Lactic acidosis most likely due to seizure Hypoglycemia Septic shock  Assessment & Plan:   Acute metabolic encephalopathy, possible some component of initial ETOH withdrawal now complicated by agitated delirium Seizures, likely due to history of previous stroke and alcohol withdrawal History of left PCA infarct Right parietal cyst, follows with NSGY - unable to obtain brain MRI due to AICD incompatibility - head CT 8/8 reviewed - stable appearance with left PCA infarct which is chronic P:  - Neuro signed off 8/8 - con't keppra; will require long-term AEDs per neuro - cont seizure precautions - cont phenobarbital taper, down to 32.4mg  q8hrs - given hx of prolonged Qtc w/Vfib arrest> limited in what we can add.  - PAD protocol> cont minimize propofol, prn versed and fentanyl pushes, increase klonopin to TID and add oxy IR q6hrs - maintain neuro protective measures; goal for eurothermia, euglycemia, eunatermia, normoxia, and PCO2 goal of 35-40 - nutrition and bowel regiment as below - delirium/ aspiration precautions   Polymorphic VT likely due to hypokalemia, hypomagnesemia VF Arrest 2015 s/p ICD placement, prolonged QT syndrome P:  -  cont telemetry monitoring - optimize electrolytes, K > 4, Mag > 2 - daily Qtc checks> 420 today   Acute hypoxemic respiratory failure requiring mechanical ventilation MSSA PNA/ HCAP P:  - on EMR review, pt is smoker and vapes - CXR today given ongoing secretions but remains overall afebrile and decreasing WBC - agitation delirium and  secretions have limited weaning efforts and vent liberation.  Tolerating PSV 10/5 better this am rather than PRVC. Cont weaning efforts and hopefull moving towards extubation.   If back on full support> PRVC vs PCV based on mechanics, reduce resting rate to 16, 8cc/kg - cont nicotene patch - change to prn albuterol, add brovana, pulmicort, yupleri - VAP / PPI  - cont ancef - PAD protocol as above - smoking cessation when appropiate - per family disscusions 8/9, pt would not want trach/ long term vent per daughter, with plans to attempt to optimize with possible one-way extubation Monday   Septic shock secondary to HAP - resolved.  Intermittent hypotension overnight, resolved, no pressors added.  Remains normotensive this am  Tobacco use disorder  Alcohol use disorder; no evidence of cirrhosis on liver US - cont thiamine, s/p high dose -PAD protocol as above - cont phenobarb taper - cessation counseling prn - BD as above  Anxiety and depression - lexapro  HTN -con't holding PTA antihypertensives   Hypothyroidism -con't synthroid  Previous tongue cancer  -Daughter states difficulty swallowing at home, needs formal SLP eval prior to initiating diet> will be deferred until extubated and able to participate. - s/p cortrak   Best Practice (right click and "Reselect  all SmartList Selections" daily)   Diet/type: tubefeeds  DVT prophylaxis: LMWH GI prophylaxis: PPI Lines: Central line- R femoral  Foley:  Yes, and it is still needed Code Status:  full code Last date of multidisciplinary goals of care discussion [updated daughter at bedside 8/8.]  P[place]ign 8/10  Critical care time:  65 mins      Posey Boyer, MSN, NP, AG-ACNP-BC [facility] 12/20/2022, 7:39 AM  See Amion for pager If no response to pager , please call [phone] until 7pm After 7:00 pm call Elink  [phone]

## 2022-12-20 NOTE — Progress Notes (Signed)
eLink Physician-Brief Progress Note Patient Name: CAMARI SCHWANDT DOB: [birth year] MRN: [id number]   Date of Service  12/20/2022  HPI/Events of Note  Patient remains agitated  eICU Interventions  Will start propofol     Intervention Category Minor Interventions: Agitation / anxiety - evaluation and management  Darl Pikes 12/20/2022, 12:53 AM

## 2022-12-20 NOTE — Procedures (Signed)
Bronchoscopy Procedure Note  Krystal Peterson  [id number]  05-12-1956  Date:12/20/22  Time:2:11 PM   Provider Performing:    Procedure(s):  Flexible bronchoscopy with bronchial alveolar lavage (40981) and Initial Therapeutic Aspiration of Tracheobronchial Tree (19147)  Indication(s) Mucous plugging  Consent Unable to obtain consent due to emergent nature of procedure.  Anesthesia Etomidate and albumin   Time Out Verified patient identification, verified procedure, site/side was marked, verified correct patient position, special equipment/implants available, medications/allergies/relevant history reviewed, required imaging and test results available.   Sterile Technique Usual hand hygiene, masks, gowns, and gloves were used   Procedure Description Bronchoscope advanced through endotracheal tube and into airway.  Airways were examined down to subsegmental level with findings noted below.   Following diagnostic evaluation, BAL(s) performed in right lower lobe with normal saline and return of thick mucousy fluid and Therapeutic aspiration performed in bilateral lower lobes  Findings: Copious amount of tenacious secretions noted throughout the respiratory tree, suctioned   Complications/Tolerance None; patient tolerated the procedure well. Chest X-ray is needed post procedure.   EBL Minimal   Specimen(s) BAL

## 2022-12-20 NOTE — Procedures (Signed)
Intubation Procedure Note  SOLARIS KOZLOFF  [id number]  July 21, 1955  Date:12/20/22  Time:2:10 PM   Provider Performing:     Procedure: Intubation (31500)  Indication(s) Respiratory Failure  Consent Unable to obtain consent due to emergent nature of procedure.   Anesthesia Etomidate and Rocuronium   Time Out Verified patient identification, verified procedure, site/side was marked, verified correct patient position, special equipment/implants available, medications/allergies/relevant history reviewed, required imaging and test results available.   Sterile Technique Usual hand hygeine, masks, and gloves were used   Procedure Description Patient positioned in bed supine.  Sedation given as noted above.  Patient was intubated with endotracheal tube using  MAC 4 .  View was Grade 1 full glottis .  Number of attempts was 1.  Colorimetric CO2 detector was consistent with tracheal placement.   Complications/Tolerance None; patient tolerated the procedure well. Chest X-ray is ordered to verify placement.   EBL Minimal   Specimen(s) None

## 2022-12-20 NOTE — Progress Notes (Signed)
eLink Physician-Brief Progress Note Patient Name: Krystal Peterson DOB: [birth year] MRN: [id number]   Date of Service  12/20/2022  HPI/Events of Note  Requesting Pressor. Patien'ts BP 76/59 (66) d/t propofol. Has turned it down to 15/hr  HR 55  eICU Interventions  Discussed with bedside RN to titrate down Precedex as patient also bradycardic Will continue to monitor for now     Intervention Category Intermediate Interventions: Hypotension - evaluation and management  Darl Pikes 12/20/2022, 4:49 AM

## 2022-12-20 NOTE — Progress Notes (Signed)
Pharmacy ICU Bowel Regimen Consult Note   Current Inpatient Medications for Bowel Management:  Polyethylene glycol 17g every day per tube Sennosides 10mL syrup every evening per tube  Assessment: Krystal Peterson is a 67 y.o. year old female admitted on 12/13/2022. Constipation identified as acute opioid-induced constipation . Last bowel movement was 8/8, bowel regimen assessment completed by Burtis Junes, RN on 12/20/22.  [x]  Bowel sounds present  [x]  No abdominal tenderness  [x]  Passing gas   Plan: Reinitiate docusate 100 mg liquid twice daily per tube Continue polyethylene glycol 17g to BID per tube Continue sennosides 10mL syrup to BID per tube   Thank you for allowing pharmacy to participate in this patient's care.  Rutherford Nail, PharmD PGY2 Critical Care Pharmacy Resident 12/20/2022 3:26 PM

## 2022-12-20 NOTE — Progress Notes (Signed)
ETT was pulled back 2cm from 24 cm to 22 cm at the lip per order. Vitals stable. No complications.

## 2022-12-20 NOTE — Plan of Care (Signed)
  Problem: Education: Goal: Knowledge of General Education information will improve Description: Including pain rating scale, medication(s)/side effects and non-pharmacologic comfort measures Outcome: Progressing   Problem: Health Behavior/Discharge Planning: Goal: Ability to manage health-related needs will improve Outcome: Progressing   Problem: Clinical Measurements: Goal: Ability to maintain clinical measurements within normal limits will improve Outcome: Progressing Goal: Will remain free from infection Outcome: Progressing Goal: Diagnostic test results will improve Outcome: Progressing Goal: Respiratory complications will improve Outcome: Progressing Goal: Cardiovascular complication will be avoided Outcome: Progressing   Problem: Activity: Goal: Risk for activity intolerance will decrease Outcome: Progressing   Problem: Nutrition: Goal: Adequate nutrition will be maintained Outcome: Progressing   Problem: Coping: Goal: Level of anxiety will decrease Outcome: Progressing   Problem: Elimination: Goal: Will not experience complications related to bowel motility Outcome: Progressing Goal: Will not experience complications related to urinary retention Outcome: Progressing   Problem: Pain Managment: Goal: General experience of comfort will improve Outcome: Progressing   Problem: Safety: Goal: Ability to remain free from injury will improve Outcome: Progressing   Problem: Skin Integrity: Goal: Risk for impaired skin integrity will decrease Outcome: Progressing   Problem: Safety: Goal: Non-violent Restraint(s) Outcome: Progressing   Problem: Activity: Goal: Ability to tolerate increased activity will improve Outcome: Progressing   Problem: Respiratory: Goal: Ability to maintain a clear airway and adequate ventilation will improve Outcome: Progressing   Problem: Role Relationship: Goal: Method of communication will improve Outcome: Progressing   

## 2022-12-21 ENCOUNTER — Other Ambulatory Visit: Payer: Self-pay

## 2022-12-21 DIAGNOSIS — R569 Unspecified convulsions: Secondary | ICD-10-CM | POA: Diagnosis not present

## 2022-12-21 LAB — POCT I-STAT 7, (LYTES, BLD GAS, ICA,H+H)
Acid-Base Excess: 3 mmol/L — ABNORMAL HIGH (ref 0.0–2.0)
Bicarbonate: 28 mmol/L (ref 20.0–28.0)
Calcium, Ion: 1.17 mmol/L (ref 1.15–1.40)
HCT: 41 % (ref 36.0–46.0)
Hemoglobin: 13.9 g/dL (ref 12.0–15.0)
O2 Saturation: 93 %
Patient temperature: 98.6
Potassium: 3.3 mmol/L — ABNORMAL LOW (ref 3.5–5.1)
Sodium: 143 mmol/L (ref 135–145)
TCO2: 29 mmol/L (ref 22–32)
pCO2 arterial: 41.4 mmHg (ref 32–48)
pH, Arterial: 7.438 (ref 7.35–7.45)
pO2, Arterial: 66 mmHg — ABNORMAL LOW (ref 83–108)

## 2022-12-21 LAB — GLUCOSE, CAPILLARY
Glucose-Capillary: 72 mg/dL (ref 70–99)
Glucose-Capillary: 75 mg/dL (ref 70–99)
Glucose-Capillary: 107 mg/dL — ABNORMAL HIGH (ref 70–99)
Glucose-Capillary: 88 mg/dL (ref 70–99)
Glucose-Capillary: 111 mg/dL — ABNORMAL HIGH (ref 70–99)
Glucose-Capillary: 115 mg/dL — ABNORMAL HIGH (ref 70–99)

## 2022-12-21 LAB — MAGNESIUM: Magnesium: 1.9 mg/dL (ref 1.7–2.4)

## 2022-12-21 LAB — CBC
HCT: 32.4 % — ABNORMAL LOW (ref 36.0–46.0)
Hemoglobin: 10.9 g/dL — ABNORMAL LOW (ref 12.0–15.0)
MCH: 34.9 pg — ABNORMAL HIGH (ref 26.0–34.0)
MCHC: 33.6 g/dL (ref 30.0–36.0)
MCV: 103.8 fL — ABNORMAL HIGH (ref 80.0–100.0)
Platelets: 292 10*3/uL (ref 150–400)
RBC: 3.12 MIL/uL — ABNORMAL LOW (ref 3.87–5.11)
RDW: 16.3 % — ABNORMAL HIGH (ref 11.5–15.5)
WBC: 16.4 10*3/uL — ABNORMAL HIGH (ref 4.0–10.5)
nRBC: 0 % (ref 0.0–0.2)

## 2022-12-21 LAB — BASIC METABOLIC PANEL
Anion gap: 12 (ref 5–15)
BUN: 13 mg/dL (ref 8–23)
CO2: 26 mmol/L (ref 22–32)
Calcium: 8 mg/dL — ABNORMAL LOW (ref 8.9–10.3)
Chloride: 105 mmol/L (ref 98–111)
Creatinine, Ser: 0.91 mg/dL (ref 0.44–1.00)
GFR, Estimated: >60 mL/min (ref >60)
Glucose, Bld: 87 mg/dL (ref 70–99)
Potassium: 3.4 mmol/L — ABNORMAL LOW (ref 3.5–5.1)
Sodium: 143 mmol/L (ref 135–145)

## 2022-12-21 LAB — TRIGLYCERIDES: Triglycerides: 252 mg/dL — ABNORMAL HIGH (ref <150)

## 2022-12-21 LAB — PHOSPHORUS: Phosphorus: 3.1 mg/dL (ref 2.5–4.6)

## 2022-12-21 MED ORDER — MAGNESIUM SULFATE 2 GM/50ML IV SOLN: 2.0000 g | Freq: Once | INTRAVENOUS | Status: DC

## 2022-12-21 MED ORDER — LEVETIRACETAM 500 MG PO TABS: 1000.0000 mg | ORAL_TABLET | Freq: Two times a day (BID) | ORAL | Status: DC

## 2022-12-21 MED ORDER — LEVETIRACETAM 500 MG PO TABS
1000.0000 mg | ORAL_TABLET | Freq: Two times a day (BID) | ORAL | Status: DC
  Administered 2022-12-21 – 2022-12-29 (×16): 1000 mg
  Filled 2022-12-21 (×16): qty 2

## 2022-12-21 MED ORDER — POTASSIUM CHLORIDE CRYS ER 20 MEQ PO TBCR: 40.0000 meq | EXTENDED_RELEASE_TABLET | Freq: Once | ORAL | Status: DC

## 2022-12-21 MED ORDER — SODIUM CHLORIDE 0.9% FLUSH: 10.0000 mL | INTRAVENOUS | Status: DC | PRN

## 2022-12-21 MED ORDER — SODIUM CHLORIDE 0.9% FLUSH
10.0000 mL | Freq: Two times a day (BID) | INTRAVENOUS | Status: DC
  Administered 2022-12-21: 20 mL
  Administered 2022-12-22 – 2022-12-25 (×5): 10 mL
  Administered 2022-12-25 – 2022-12-27 (×4): 20 mL
  Administered 2022-12-27 – 2023-01-03 (×14): 10 mL

## 2022-12-21 MED ORDER — POTASSIUM CHLORIDE 20 MEQ PO PACK
40.0000 meq | PACK | Freq: Once | ORAL | Status: AC
  Administered 2022-12-21: 40 meq
  Filled 2022-12-21: qty 2

## 2022-12-21 MED ORDER — MAGNESIUM SULFATE 2 GM/50ML IV SOLN
2.0000 g | Freq: Once | INTRAVENOUS | Status: AC
  Administered 2022-12-21: 2 g via INTRAVENOUS
  Filled 2022-12-21: qty 50

## 2022-12-21 MED ORDER — POTASSIUM CHLORIDE 20 MEQ PO PACK
40.0000 meq | PACK | Freq: Once | ORAL | Status: AC
  Administered 2022-12-21: 40 meq via ORAL
  Filled 2022-12-21: qty 2

## 2022-12-21 NOTE — Progress Notes (Signed)
Peripherally Inserted Central Catheter Placement  The IV Nurse has discussed with the patient and/or persons authorized to consent for the patient, the purpose of this procedure and the potential benefits and risks involved with this procedure.  The benefits include less needle sticks, lab draws from the catheter, and the patient may be discharged home with the catheter. Risks include, but not limited to, infection, bleeding, blood clot (thrombus formation), and puncture of an artery; nerve damage and irregular heartbeat and possibility to perform a PICC exchange if needed/ordered by physician.  Alternatives to this procedure were also discussed.  Bard Power PICC patient education guide, fact sheet on infection prevention and patient information card has been provided to patient /or left at bedside.  Debbie RN obtained consent from husband at bedside.  PICC Placement Documentation  PICC Double Lumen 12/21/22 Right Brachial 36 cm 0 cm (Active)  Indication for Insertion or Continuance of Line Vasoactive infusions;Limited venous access - need for IV therapy >5 days (PICC only) 12/21/22 1900  Exposed Catheter (cm) 0 cm 12/21/22 1900  Site Assessment Clean, Dry, Intact 12/21/22 1900  Lumen #1 Status Flushed;Saline locked;Blood return noted 12/21/22 1900  Lumen #2 Status Flushed;Saline locked;Blood return noted 12/21/22 1900  Dressing Type Transparent;Securing device 12/21/22 1900  Dressing Status Antimicrobial disc in place;Clean, Dry, Intact 12/21/22 1900  Line Care Connections checked and tightened 12/21/22 1900  Line Adjustment (NICU/IV Team Only) No 12/21/22 1900  Dressing Intervention New dressing 12/21/22 1900  Dressing Change Due 12/28/22 12/21/22 1900       Elliot Dally 12/21/2022, 7:00 PM

## 2022-12-21 NOTE — Progress Notes (Signed)
Bedside RN states that she will d/c central line and will consult the IV team if needed.

## 2022-12-21 NOTE — Progress Notes (Signed)
NAME:  Krystal Peterson, MRN:  [id number], DOB:  [birth year], LOS: 7 ADMISSION DATE:  12/13/2022, CONSULTATION DATE:  12/21/2022 REFERRING MD:  EDP, CHIEF COMPLAINT:  seizure   History of Present Illness:  67 year old lady very limited care at [facility] health.  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  Then husband witnessed seizure by the husband with a left gaze.  Tonic-clonic generalized.   Husband did CPR on advise by EMS for 15 min though he felt she did not lose pulse. Had 1 more episode of seizure on arrival via EMS.  In the ER unresponsive and intubated.  Per ED.  Neuro does not think patient has acute stroke    Pertinent  Medical History  Alcohol use disorder Tobacco used disorder Prolonged QT with cardiac arrest s/p ICD HTN SCC of the tongue s/p chemo, radiation Rheumatoid Arthritis  Significant Hospital Events: Including procedures, antibiotic start and stop dates in addition to other pertinent events   8/4 admitted- presented with stroke like symptoms after a seizure. Had been off ETOH due to n/v/d. 8/5 extubated 8/6 remains agitated but can answer many questions and is periodically directable 8/6-8.7 reintubated overnight, CVC 8/7 polymorphic VT x 2, aborted with AICD shocks. Probably explained by lytes. Qtc WNL. 8/9  ongoing agitation, failed SBT on fent gtt> stopped, precedex> propofol 8/10 occluded ETT 2/2 secretions s/p bronch/ reintubation, ongoing agtitation  Interim History / Subjective:  Smear BM this am Off pressors No further significant ET secretions Weaning on PSV  Objective   Blood pressure 112/81, pulse 93, temperature 99.2 F (37.3 C), temperature source Oral, resp. rate 18, height 5' 1.89" (1.572 m), weight 61.3 kg, SpO2 100%.    Vent Mode: PRVC FiO2 (%):  [40 %-60 %] 40 % Set Rate:  [16 bmp-20 bmp] 20 bmp Vt Set:  [400 mL] 400 mL PEEP:  [5 cmH20] 5 cmH20 Pressure Support:  [8 cmH20] 8 cmH20 Plateau Pressure:  [16 cmH20-23 cmH20] 16 cmH20   Intake/Output Summary (Last 24 hours) at 12/21/2022 0734 Last data filed at 12/21/2022 0600 Gross per 24 hour  Intake 2684.28 ml  Output 2375 ml  Net 309.28 ml   Filed Weights   12/19/22 0500 12/20/22 0428 12/21/22 0430  Weight: 59.3 kg 60 kg 61.3 kg    Examination: Propofol 55 General:  chronically ill appearing elderly female sedated in bed in NAD HEENT: MM pink/moist, ETT, cortrak, pupils 4/r, anicteric Neuro: sedated, does not open eyes to verbal or f/c but stirs with exam, MAE CV: rr, no murmur PULM:  non labored on full MV support, apneic on SBT, lungs clear, slightly coarse/ few right basilar crackles, scant tan secretions GI: soft, bs+, no distention or tenderness, foley- cyu Extremities: warm/dry, no tibial edema, mild UE dependent edema, L> R due to left IV PIV infiltration  Skin: no rashes, mild abrasion to ankles from prior restraints  Afebrile  Labs reviewed> K 3.4, mag 1.9, WBC stable BAL 8/10>>  Resolved Hospital Problem list   AKI-resolved Nausea, vomiting, suspected gastroenteritis Troponin elevation, most likely due to seizure activity Lactic acidosis most likely due to seizure Hypoglycemia Septic shock  Assessment & Plan:   Acute metabolic encephalopathy, possible some component of initial ETOH withdrawal  now complicated by agitated delirium Seizures, likely due to history of previous stroke and alcohol withdrawal History of left PCA infarct Right parietal cyst, follows with NSGY - unable to obtain brain MRI due to AICD incompatibility - head CT 8/8 reviewed - stable appearance with left PCA infarct which is chronic P:  - Neuro signed off 8/8 - con't keppra; will require long-term AEDs per neuro - cont seizure precautions - cont phenobarbital taper - given hx of prolonged Qtc w/Vfib arrest> limited in what we can add.  - PAD protocol> cont minimize propofol, prn fentanyl,  klonopin to TID, oxy IR q6hrs with scheduled bowel regimen - maintain neuro protective measures; goal for eurothermia, euglycemia, eunatermia, normoxia, and PCO2 goal of 35-40 - nutrition and bowel regiment as below - delirium/ aspiration precautions   Polymorphic VT likely due to hypokalemia, hypomagnesemia VF Arrest 2015 s/p ICD placement, prolonged QT syndrome P:  -  cont telemetry monitoring - optimize electrolytes, K > 4, Mag > 2 - daily Qtc checks  Acute hypoxemic respiratory failure requiring mechanical ventilation MSSA PNA/ HCAP P:  - on EMR review, pt is smoker and vapes - continue SAT/ SBT, hopeful for possible extubation, no secretions today - full MV support - follow BAL 8/10 - cont BD - VAP/ PPI - nicotine patch/ smoking cessation when appropriate  - intermittent CXR - ABG reassuring  -- cont ancef pending further culture data - per family disscusions 8/9, pt would not want trach/ long term vent per daughter, with plans to attempt to optimize with possible one-way extubation Monday   Septic shock secondary to HAP - resolved.  Goal MAP > 65 - IV team consult for PIV, if unable will need PICC, d/c fem CVL  Tobacco use disorder  Alcohol use disorder; no evidence of cirrhosis on liver US - cont thiamine - PAD protocol as above - cont phenobarb taper - cessation counseling prn - BD as  above  Anxiety and depression - lexapro  HTN -con't holding PTA antihypertensives   Hypothyroidism -con't synthroid  Previous tongue cancer  -Daughter states difficulty swallowing at home, needs formal SLP eval prior to initiating diet> will be deferred until extubated and able to participate. - s/p cortrak   Constipation - aggressive bowel regimen  Best Practice (right click and "Reselect all SmartList Selections" daily)   Diet/type: tubefeeds  DVT prophylaxis: LMWH GI prophylaxis: PPI Lines: Central line- R femoral > d/c today Foley:  Yes, and it is still needed Code Status:  full code Last date of multidisciplinary goals of care discussion [updated daughter at bedside 8/8.]  Pending 8/11  Critical care time:  32 mins      Posey Boyer, MSN, NP, AG-ACNP-BC [facility] 12/21/2022, 7:34 AM  See Amion for pager If no response to pager , please call [phone] until 7pm After 7:00 pm call Elink  [phone]

## 2022-12-21 NOTE — Plan of Care (Signed)
  Problem: Education: Goal: Knowledge of General Education information will improve Description: Including pain rating scale, medication(s)/side effects and non-pharmacologic comfort measures Outcome: Not Progressing   Problem: Health Behavior/Discharge Planning: Goal: Ability to manage health-related needs will improve Outcome: Not Progressing   Problem: Clinical Measurements: Goal: Ability to maintain clinical measurements within normal limits will improve Outcome: Not Progressing Goal: Will remain free from infection Outcome: Not Progressing Goal: Diagnostic test results will improve Outcome: Not Progressing Goal: Respiratory complications will improve Outcome: Not Progressing Goal: Cardiovascular complication will be avoided Outcome: Not Progressing   Problem: Activity: Goal: Risk for activity intolerance will decrease Outcome: Not Progressing   Problem: Nutrition: Goal: Adequate nutrition will be maintained Outcome: Not Progressing   Problem: Coping: Goal: Level of anxiety will decrease Outcome: Not Progressing   Problem: Elimination: Goal: Will not experience complications related to bowel motility Outcome: Not Progressing Goal: Will not experience complications related to urinary retention Outcome: Not Progressing   Problem: Pain Managment: Goal: General experience of comfort will improve Outcome: Not Progressing   Problem: Safety: Goal: Ability to remain free from injury will improve Outcome: Not Progressing   Problem: Skin Integrity: Goal: Risk for impaired skin integrity will decrease Outcome: Not Progressing   Problem: Safety: Goal: Non-violent Restraint(s) Outcome: Not Progressing   Problem: Activity: Goal: Ability to tolerate increased activity will improve Outcome: Not Progressing   Problem: Respiratory: Goal: Ability to maintain a clear airway and adequate ventilation will improve Outcome: Not Progressing   Problem: Role  Relationship: Goal: Method of communication will improve Outcome: Not Progressing

## 2022-12-22 DIAGNOSIS — E43 Unspecified severe protein-calorie malnutrition: Secondary | ICD-10-CM | POA: Diagnosis not present

## 2022-12-22 DIAGNOSIS — R569 Unspecified convulsions: Secondary | ICD-10-CM | POA: Diagnosis not present

## 2022-12-22 DIAGNOSIS — J9601 Acute respiratory failure with hypoxia: Secondary | ICD-10-CM | POA: Diagnosis not present

## 2022-12-22 LAB — RENAL FUNCTION PANEL
Albumin: 1.8 g/dL — ABNORMAL LOW (ref 3.5–5.0)
Anion gap: 13 (ref 5–15)
BUN: 15 mg/dL (ref 8–23)
CO2: 26 mmol/L (ref 22–32)
Calcium: 8.5 mg/dL — ABNORMAL LOW (ref 8.9–10.3)
Chloride: 101 mmol/L (ref 98–111)
Creatinine, Ser: 0.78 mg/dL (ref 0.44–1.00)
GFR, Estimated: >60 mL/min (ref >60)
Glucose, Bld: 90 mg/dL (ref 70–99)
Phosphorus: 4.2 mg/dL (ref 2.5–4.6)
Potassium: 4.3 mmol/L (ref 3.5–5.1)
Sodium: 140 mmol/L (ref 135–145)

## 2022-12-22 LAB — GLUCOSE, CAPILLARY
Glucose-Capillary: 84 mg/dL (ref 70–99)
Glucose-Capillary: 110 mg/dL — ABNORMAL HIGH (ref 70–99)
Glucose-Capillary: 124 mg/dL — ABNORMAL HIGH (ref 70–99)
Glucose-Capillary: 115 mg/dL — ABNORMAL HIGH (ref 70–99)
Glucose-Capillary: 120 mg/dL — ABNORMAL HIGH (ref 70–99)
Glucose-Capillary: 118 mg/dL — ABNORMAL HIGH (ref 70–99)

## 2022-12-22 LAB — CULTURE, RESPIRATORY W GRAM STAIN

## 2022-12-22 MED ORDER — MIDAZOLAM HCL 2 MG/2ML IJ SOLN: 1.0000 mg | INTRAMUSCULAR | Status: DC | PRN

## 2022-12-22 MED ORDER — POLYETHYLENE GLYCOL 3350 17 G PO PACK
17.0000 g | PACK | Freq: Every day | ORAL | Status: DC
  Administered 2022-12-22 – 2022-12-27 (×5): 17 g
  Filled 2022-12-22 (×5): qty 1

## 2022-12-22 MED ORDER — FENTANYL CITRATE PF 50 MCG/ML IJ SOSY: 25.0000 ug | PREFILLED_SYRINGE | INTRAMUSCULAR | Status: DC | PRN

## 2022-12-22 MED ORDER — BETHANECHOL CHLORIDE 10 MG PO TABS
10.0000 mg | ORAL_TABLET | Freq: Three times a day (TID) | ORAL | Status: DC
  Administered 2022-12-22 – 2022-12-25 (×9): 10 mg
  Filled 2022-12-22 (×9): qty 1

## 2022-12-22 MED ORDER — NICOTINE 14 MG/24HR TD PT24
14.0000 mg | MEDICATED_PATCH | Freq: Every day | TRANSDERMAL | Status: DC
  Administered 2022-12-23 – 2023-01-03 (×12): 14 mg via TRANSDERMAL
  Filled 2022-12-22 (×12): qty 1

## 2022-12-22 MED ORDER — FUROSEMIDE 10 MG/ML IJ SOLN
40.0000 mg | Freq: Once | INTRAMUSCULAR | Status: AC
  Administered 2022-12-22: 40 mg via INTRAVENOUS
  Filled 2022-12-22: qty 4

## 2022-12-22 MED ORDER — DOCUSATE SODIUM 50 MG/5ML PO LIQD
100.0000 mg | Freq: Every day | ORAL | Status: DC
  Administered 2022-12-22 – 2022-12-27 (×5): 100 mg
  Filled 2022-12-22 (×6): qty 10

## 2022-12-22 NOTE — Progress Notes (Addendum)
Pharmacy ICU Bowel Regimen Consult Note   Current Inpatient Medications for Bowel Management:  Docusate 100 mg liquid to twice daily per tube Polyethylene glycol 17g to twice daily per tube Sennosides 10mL syrup to twice daily per tube  Assessment: Krystal Peterson is a 67 y.o. year old female admitted on 12/13/2022. Constipation identified as acute opioid-induced constipation . Last bowel movement was 8/11, bowel regimen assessment completed by Eunice Blase, RN on 12/22/22.  [x]  Bowel sounds present  [x]  No abdominal tenderness  [x]  Passing gas    Increased loose stools (BM x 5 8/11)   Plan: Decrease docusate 100 mg liquid to once daily per tube Decrease polyethylene glycol 17g to once daily per tube Discontinue sennosides 10mL syrup    Thank you for allowing pharmacy to participate in this patient's care.  Stephenie Acres, PharmD PGY1 Pharmacy Resident 12/22/2022 9:40 AM

## 2022-12-22 NOTE — Procedures (Signed)
Extubation Procedure Note  Patient Details:   Name: Krystal Peterson DOB: Aug 08, 1955 MRN: [id number]   Airway Documentation:    Vent end date: 12/22/22 Vent end time: 0844   Evaluation  O2 sats: stable throughout Complications: No apparent complications Patient did tolerate procedure well. Bilateral Breath Sounds: Clear   Yes, pt able to cough to clear secretions, pt placed on 3L nasal cannula and tolerating well at this time. Pt positive for cuff leak prior to extubation  Tacy Learn 12/22/2022, 8:44 AM

## 2022-12-22 NOTE — Plan of Care (Signed)
  Problem: Education: Goal: Knowledge of General Education information will improve Description: Including pain rating scale, medication(s)/side effects and non-pharmacologic comfort measures Outcome: Not Progressing   Problem: Health Behavior/Discharge Planning: Goal: Ability to manage health-related needs will improve Outcome: Not Progressing   Problem: Clinical Measurements: Goal: Ability to maintain clinical measurements within normal limits will improve Outcome: Not Progressing Goal: Will remain free from infection Outcome: Not Progressing Goal: Diagnostic test results will improve Outcome: Not Progressing Goal: Respiratory complications will improve Outcome: Not Progressing Goal: Cardiovascular complication will be avoided Outcome: Not Progressing   Problem: Activity: Goal: Risk for activity intolerance will decrease Outcome: Not Progressing   Problem: Nutrition: Goal: Adequate nutrition will be maintained Outcome: Not Progressing   Problem: Coping: Goal: Level of anxiety will decrease Outcome: Not Progressing   Problem: Elimination: Goal: Will not experience complications related to bowel motility Outcome: Not Progressing Goal: Will not experience complications related to urinary retention Outcome: Not Progressing   Problem: Pain Managment: Goal: General experience of comfort will improve Outcome: Not Progressing   Problem: Safety: Goal: Ability to remain free from injury will improve Outcome: Not Progressing   Problem: Skin Integrity: Goal: Risk for impaired skin integrity will decrease Outcome: Not Progressing   Problem: Safety: Goal: Non-violent Restraint(s) Outcome: Not Progressing   Problem: Activity: Goal: Ability to tolerate increased activity will improve Outcome: Not Progressing   Problem: Respiratory: Goal: Ability to maintain a clear airway and adequate ventilation will improve Outcome: Not Progressing   Problem: Role  Relationship: Goal: Method of communication will improve Outcome: Not Progressing

## 2022-12-22 NOTE — Progress Notes (Signed)
NAME:  Krystal Peterson, MRN:  [id number], DOB:  07-31-1955, LOS: 8 ADMISSION DATE:  12/13/2022, CONSULTATION DATE:  12/22/2022 REFERRING MD:  EDP, CHIEF COMPLAINT:  seizure   History of Present Illness:  67 year old lady very limited care at [facility] health.  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 patch without atrial fibrillation] on antiplatelet therapy, ongoing smoking, intermittent UTI periodically on Cipro   Per EDP patient has had some nausea and vomiting and diarrhea 12/12/22 followng eating bad mushrooms for few days and therefore quit drinking alcohol which she normally does on a regular basis.  Then husband witnessed seizure by the husband with a left gaze.  Tonic-clonic generalized.   Husband did CPR on advise by EMS for 15 min though he felt she did not lose pulse. Had 1 more episode of seizure on arrival via EMS.  In the ER unresponsive and intubated.  Per ED.  Neuro does not think patient has acute stroke    Pertinent  Medical History  Alcohol use disorder Tobacco used disorder Prolonged QT with cardiac arrest s/p ICD HTN SCC of the tongue s/p chemo, radiation Rheumatoid Arthritis  Significant Hospital Events: Including procedures, antibiotic start and stop dates in addition to other pertinent events   8/4 admitted- presented with stroke like symptoms after a seizure. Had been off ETOH due to n/v/d. 8/5 extubated 8/6 remains agitated but can answer many questions and is periodically directable 8/6-8.7 reintubated overnight, CVC 8/7 polymorphic VT x 2, aborted with AICD shocks. Probably explained by lytes. Qtc WNL. 8/9  ongoing agitation, failed SBT on fent gtt> stopped, precedex> propofol 8/10 occluded ETT 2/2 secretions s/p bronch/ reintubation, ongoing agtitation  Interim History / Subjective:  Patient is tolerating spontaneous breathing trial, will try to extubate her Remain afebrile Mental status is improving  Objective   Blood pressure (!) 143/97, pulse 98, temperature 98.4 F (36.9 C), temperature source Axillary, resp. rate (!) 24, height 5' 1.89" (1.572 m), weight 58.7 kg, SpO2 100%.    Vent Mode: PSV;CPAP FiO2 (%):  [40 %] 40 % Set Rate:  [20 bmp] 20 bmp Vt Set:  [400 mL] 400 mL PEEP:  [5 cmH20] 5 cmH20 Pressure Support:  [5 cmH20-8 cmH20] 5 cmH20   Intake/Output Summary (Last 24 hours) at 12/22/2022 0809 Last data filed at 12/22/2022 0800 Gross per 24 hour  Intake 1749.99 ml  Output 5600 ml  Net -3850.01 ml   Filed Weights   12/20/22 0428 12/21/22 0430 12/22/22 0500  Weight: 60 kg 61.3 kg 58.7 kg    Examination: Physical exam: General: Crtitically ill-appearing female, orally intubated HEENT: [place]/AT, eyes anicteric.  ETT and OGT in place Neuro: Opens eyes with vocal stimuli, following simple commands, weaker left upper extremity Chest: Bilateral fine crackles at bases, no wheezes or rhonchi Heart: Tachycardic, regular rhythm, no murmurs or gallops Abdomen: Soft, nondistended, bowel sounds present Skin: No rash  Labs and images were reviewed  Resolved Hospital Problem list   AKI-resolved Nausea, vomiting, suspected gastroenteritis Troponin elevation, most likely due to seizure activity Lactic acidosis most likely due to seizure Hypoglycemia Septic shock Constipation  Assessment & Plan:  Acute toxic/septic encephalopathy, possible some component  of initial ETOH withdrawal now complicated sepsis due to MSSA pneumonia Seizures, likely due to alcohol withdrawal Prior history of stroke Right parietal cyst, follows with NSGY Unable to obtain brain MRI due to AICD  incompatibility Minimize sedation with RASS goal -1 On phenobarb taper for alcohol withdrawal  Polymorphic VT likely due to hypokalemia, hypomagnesemia Prior history of VF Arrest 2015 s/p ICD placement, prolonged QT syndrome Continue telemetry monitoring  Acute hypoxemic/hypercapnic respiratory failure requiring mechanical ventilation Severe sepsis with septic shock MSSA PNA, not POA Mucous plugging Tobacco dependence Continue protective ventilation Hypercapnia has cleared Patient is tolerating spontaneous breathing trial VAP prevention bundle in place Continue IV cefazolin Secretions have improved, will try to extubate her Continue nicotine patch to prevent nicotine withdrawal Shock has resolved, remain off vasopressor support  Anxiety and depression Continue Lexapro  HTN Holding antihypertensive meds for now  Hypothyroidism con't synthroid  Previous tongue cancer  Severe protein calorie malnutrition Daughter states difficulty swallowing at home, needs formal SLP eval prior to initiating diet> will be deferred until extubated and able to participate. On tube feeds  Best Practice (right click and "Reselect all SmartList Selections" daily)   Diet/type: tubefeeds  DVT prophylaxis: LMWH GI prophylaxis: PPI Lines: PICC line Foley: Discontinued today Code Status:  full code Last date of multidisciplinary goals of care discussion [8/12: Patient's husband was updated at bedside, decision was to proceed with one-way extubation]  The patient is critically ill due to acute respiratory failure/MSSA pneumonia.  Critical care was necessary to treat or prevent imminent or life-threatening deterioration.  Critical care was time spent personally by me on the following activities: development of treatment plan with patient and/or surrogate as well as nursing, discussions with consultants, evaluation of patient's response to treatment, examination of patient, obtaining history from patient  or surrogate, ordering and performing treatments and interventions, ordering and review of laboratory studies, ordering and review of radiographic studies, pulse oximetry, re-evaluation of patient's condition and participation in multidisciplinary rounds.   During this encounter critical care time was devoted to patient care services described in this note for 37 minutes.    Cheri Fowler, MD [facility] See Amion for pager If no response to pager, please call [phone] until 7pm After 7pm, Please call E-link [phone]

## 2022-12-22 NOTE — Progress Notes (Signed)
eLink Physician-Brief Progress Note Patient Name: Krystal Peterson DOB: Sep 25, 1955 MRN: [id number]   Date of Service  12/22/2022  HPI/Events of Note  Notified regarding hypotension SBP 80s  On propofol 20  SBP improved to SBP 100s on Prop 10 Patient appears comfortable  eICU Interventions  Continue current sedation PRN fentanyl and versed per PAD protocol added     Intervention Category Intermediate Interventions: Hypotension - evaluation and management   Mechele Collin 12/22/2022, 12:01 AM

## 2022-12-22 NOTE — Evaluation (Signed)
Clinical/Bedside Swallow Evaluation Patient Details  Name: TAMSEN OTERI MRN: [id number] Date of Birth: [birth year]  Today's Date: 12/22/2022 Time: SLP Start Time (ACUTE ONLY): 1520 SLP Stop Time (ACUTE ONLY): 1530 SLP Time Calculation (min) (ACUTE ONLY): 10 min  Past Medical History:  Past Medical History:  Diagnosis Date   Hypertension    Hypothyroidism    Prolonged Q-T interval on ECG    Stroke (HCC)    Tongue cancer (HCC) 2013   resected & chemo at [facility]- [id number]   VT (ventricular tachycardia) (HCC)    Past Surgical History:  HPI:  67 year old lady,  had some nausea and vomiting and diarrhea 12/12/22 followng eating bad mushrooms for few days and therefore quit drinking alcohol which she normally does on a regular basis.  Then husband witnessed seizure by the husband with a left gaze.  Tonic-clonic generalized.   Husband did CPR on advise by EMS for 15 min though he felt she did not lose pulse. Had 1 more episode of seizure on arrival via EMS.  In the ER unresponsive and intubated 8/4-8/5 then 8/6-8/12.   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 Pt did not immediately swallow but started trying to clear her throat with little strength or efficacy. Pt eventually swallowed followed by coughing. Pt has a history of lingual cancer with overt lingual weakness noted on exam but cannot give a sufficient history of impact or diet modifications at baseline. Pt is  recommended to remain NPO with cortrak for now. Will f/u for MBS readiness; will need increased endurance and improved mentation to pariticipate fully. SLP Visit Diagnosis: Dysphagia, unspecified (R13.10)    Aspiration Risk  Moderate aspiration risk    Diet Recommendation NPO;Alternative means - temporary         Other  Recommendations      Recommendations for follow up therapy are one component of a multi-disciplinary discharge planning process, led by the attending physician.  Recommendations may be updated based on patient status, additional functional criteria and insurance authorization.  Follow up Recommendations Skilled nursing-short term rehab (<3 hours/day)      Assistance Recommended at Discharge    Functional Status Assessment Patient has had a recent decline in their functional status and demonstrates the ability to make significant improvements in function in a reasonable and predictable amount of time.  Frequency and Duration min 2x/week  2 weeks       Prognosis Prognosis for improved oropharyngeal function: Good      Swallow Study   General HPI: 67 year old lady,  had some nausea and vomiting and diarrhea 12/12/22 followng eating bad mushrooms for few days and therefore quit drinking alcohol which she normally does on a regular basis.  Then husband witnessed seizure by the husband with a left gaze.  Tonic-clonic generalized.   Husband did CPR on advise by EMS for 15 min though he felt she did not lose pulse. Had 1 more episode of seizure on arrival via EMS.  In the ER unresponsive and intubated 8/4-8/5 then  8/6-8/12.   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 (Zio patch without atrial fibrillation) on antiplatelet therapy, ongoing smoking,  intermittent UTI periodically on Cipro Type of Study: Bedside Swallow Evaluation Previous Swallow Assessment: none in chart, but likely in another facility Diet Prior to this Study: NPO;Cortrak/Small bore NG tube Temperature Spikes Noted: No Respiratory Status: Nasal cannula History of Recent Intubation: Yes Total duration of intubation (days): 8 days Date extubated: 12/22/22 Behavior/Cognition: Confused;Requires cueing Oral Cavity Assessment: Dry Oral Care Completed by SLP: No Oral Cavity - Dentition: Missing dentition Self-Feeding Abilities: Total assist Patient Positioning: Upright in bed Baseline Vocal Quality: Hoarse Volitional Cough: Weak Volitional Swallow: Able to elicit    Oral/Motor/Sensory Function Overall Oral Motor/Sensory Function: Moderate impairment Facial ROM: Within Functional Limits Facial Symmetry: Within Functional Limits Facial Strength: Within Functional Limits Facial Sensation: Within Functional Limits Lingual ROM: Reduced right Lingual Symmetry: Abnormal symmetry right Lingual Strength: Reduced   Ice Chips Ice chips: Impaired Presentation: Spoon Pharyngeal Phase Impairments: Suspected delayed Swallow;Multiple swallows;Throat Clearing - Immediate   Thin Liquid Thin Liquid: Not tested    Nectar Thick Nectar Thick Liquid: Not tested   Honey Thick Honey Thick Liquid: Not tested   Puree Puree: Not tested   Solid            , [name] 12/22/2022,3:32 PM

## 2022-12-22 NOTE — Evaluation (Signed)
Occupational Therapy Evaluation Patient Details Name: Krystal Peterson MRN: [id number] DOB: [birth year] Today's Date: 12/22/2022   History of Present Illness Pt is 67 year old presented to [facility] on  12/14/22 for with stroke like symptoms after a seizure. Had been off ETOH due to n/v. Pt with severe sepsis with septic shock MSSA PNA. Intubated on admission. Extubated 8/5. Reintubated 8/6. Extubated 8/12.  PMH - CVA, ICD, depression, HTN, etoh use disorder, tongue CA, RA   Clinical Impression   [name] was evaluated s/p the above admission list. Questionable historian however pt reports she is indep and lives with her husband at baseline. Upon evaluation the pt was limited by lethargy, generalized weakness L>R, impaired cognition, and decreased activity tolerance. Overall she needed max A+2 for bed mobility with significant cues for arousal and participation. Due to the deficits listed below the pt also needs up to total A for LB ADLs and max A for UB ADLs. Pt will benefit from continued acute OT services and skilled inpatient follow up therapy, <3 hours/day.         If plan is discharge home, recommend the following: A lot of help with walking and/or transfers;Two people to help with walking and/or transfers;A lot of help with bathing/dressing/bathroom;Two people to help with bathing/dressing/bathroom;Assistance with cooking/housework;Assistance with feeding;Direct supervision/assist for medications management;Direct supervision/assist for financial management;Assist for transportation;Help with stairs or ramp for entrance;Supervision due to cognitive status    Functional Status Assessment  Patient has had a recent decline in their functional status and demonstrates the ability to make significant improvements in function in a reasonable and predictable amount of time.  Equipment Recommendations  Other (comment) (defer)       Precautions / Restrictions Precautions Precautions: Fall Precaution  Comments: cortrak Restrictions Weight Bearing Restrictions: No      Mobility Bed Mobility Overal bed mobility: Needs Assistance Bed Mobility: Rolling, Sidelying to Sit, Sit to Supine Rolling: Max assist, +2 for physical assistance, +2 for safety/equipment Sidelying to sit: Max assist, +2 for safety/equipment, +2 for physical assistance   Sit to supine: Max assist, +2 for physical assistance, +2 for safety/equipment        Transfers Overall transfer level: Needs assistance Equipment used: 2 person hand held assist Transfers: Sit to/from Stand Sit to Stand: Max assist, +2 safety/equipment, +2 physical assistance           General transfer comment: did not obtain full upright posture      Balance Overall balance assessment: Needs assistance Sitting-balance support: Feet supported Sitting balance-Leahy Scale: Poor Sitting balance - Comments: min - max A for sitting balance   Standing balance support: Bilateral upper extremity supported, During functional activity Standing balance-Leahy Scale: Zero                             ADL either performed or assessed with clinical judgement   ADL Overall ADL's : Needs assistance/impaired Eating/Feeding: NPO   Grooming: Maximal assistance   Upper Body Bathing: Maximal assistance   Lower Body Bathing: Total assistance   Upper Body Dressing : Maximal assistance   Lower Body Dressing: Total assistance   Toilet Transfer: Total assistance   Toileting- Clothing Manipulation and Hygiene: Total assistance       Functional mobility during ADLs: Maximal assistance;+2 for physical assistance;+2 for safety/equipment General ADL Comments: limited by lethargy, weakness, poor sitting balance     Vision Baseline Vision/History: 0 No visual deficits Vision Assessment?: Vision  impaired- to be further tested in functional context Additional Comments: needs further assessment, likely [facility]. Pt's eyes were closed much of the  session     Perception Perception: Not tested       Praxis Praxis: Not tested       Pertinent Vitals/Pain Pain Assessment Pain Assessment: No/denies pain     Extremity/Trunk Assessment Upper Extremity Assessment Upper Extremity Assessment: Generalized weakness;RUE deficits/detail;LUE deficits/detail RUE Deficits / Details: difficult to assess, pt moving RUE through full ROM. Globally 4/5 LUE Deficits / Details: difficult to assess, pt not moving LUE as well as R. Assume residual deficits from prior CVA LUE Coordination: decreased fine motor;decreased gross motor   Lower Extremity Assessment Lower Extremity Assessment: Defer to PT evaluation   Cervical / Trunk Assessment Cervical / Trunk Assessment: Kyphotic   Communication Communication Communication: Difficulty communicating thoughts/reduced clarity of speech (difficult to understand) Cueing Techniques: Verbal cues   Cognition Arousal: Lethargic Behavior During Therapy: Flat affect Overall Cognitive Status: Difficult to assess                                 General Comments: Pt able to state name, birthday, "hopsital" and give the year out of 3 options. Pt required maximal cues and stimulation to participate in mobility. Lethargic with eyes closed much of the session, arousal improved slightly with upright posture     General Comments  VSS on 5L, SpO2 reading 87% with poor pleth     Home Living Family/patient expects to be discharged to:: Private residence Living Arrangements: Spouse/significant other Available Help at Discharge: Family Type of Home: House       Home Layout: Two level;Bed/bath upstairs;Able to live on main level with bedroom/bathroom                   Additional Comments: Unable to get full home set up from pt      Prior Functioning/Environment Prior Level of Function : Independent/Modified Independent;Patient poor historian/Family not available              Mobility Comments: reports no AD ADLs Comments: reports indep, husband completes IADLs        OT Problem List: Decreased strength;Decreased range of motion;Decreased activity tolerance;Impaired balance (sitting and/or standing);Decreased cognition;Decreased safety awareness;Decreased knowledge of use of DME or AE;Decreased knowledge of precautions      OT Treatment/Interventions: Self-care/ADL training;DME and/or AE instruction;Therapeutic activities;Balance training;Patient/family education    OT Goals(Current goals can be found in the care plan section) Acute Rehab OT Goals Patient Stated Goal: unable to state OT Goal Formulation: With patient Time For Goal Achievement: 01/05/23 Potential to Achieve Goals: Good ADL Goals Pt Will Perform Grooming: with min assist;sitting Pt Will Perform Upper Body Dressing: sitting;with min assist Pt Will Perform Lower Body Dressing: with mod assist;sit to/from stand Pt Will Transfer to Toilet: with mod assist;stand pivot transfer;bedside commode Additional ADL Goal #1: Pt will complete bed mobility with min A as a precursor to ADLs  OT Frequency: Min 1X/week    Co-evaluation PT/OT/SLP Co-Evaluation/Treatment: Yes Reason for Co-Treatment: Complexity of the patient's impairments (multi-system involvement);For patient/therapist safety;To address functional/ADL transfers   OT goals addressed during session: ADL's and self-care      AM-PAC OT "6 Clicks" Daily Activity     Outcome Measure Help from another person eating meals?: Total Help from another person taking care of personal grooming?: A Lot Help from another person toileting,  which includes using toliet, bedpan, or urinal?: Total Help from another person bathing (including washing, rinsing, drying)?: A Lot Help from another person to put on and taking off regular upper body clothing?: A Lot Help from another person to put on and taking off regular lower body clothing?: Total 6 Click  Score: 9   End of Session Equipment Utilized During Treatment: Oxygen Nurse Communication: Mobility status  Activity Tolerance: Patient tolerated treatment well Patient left: in bed;with call bell/phone within reach;with bed alarm set  OT Visit Diagnosis: Unsteadiness on feet (R26.81);Other abnormalities of gait and mobility (R26.89);Muscle weakness (generalized) (M62.81)                Time: 4098-1191 OT Time Calculation (min): 17 min Charges:  OT General Charges $OT Visit: 1 Visit OT Evaluation $OT Eval Moderate Complexity: 1 Mod  Derenda Mis, OTR/L Acute Rehabilitation Services Office [phone] Secure Chat Communication Preferred   Donia Pounds 12/22/2022, 3:31 PM

## 2022-12-22 NOTE — Evaluation (Signed)
Physical Therapy Evaluation Patient Details Name: Krystal Peterson MRN: [id number] DOB: 06-12-1955 Today's Date: 12/22/2022  History of Present Illness  Pt is 67 year old presented to [facility] on  12/14/22 for with stroke like symptoms after a seizure. Had been off ETOH due to n/v. Pt with severe sepsis with septic shock MSSA PNA. Intubated on admission. Extubated 8/5. Reintubated 8/6. Extubated 8/12.  PMH - CVA, ICD, depression, HTN, etoh use disorder, tongue CA, RA  Clinical Impression  Pt admitted with above diagnosis and presents to PT with functional limitations due to deficits listed below (See PT problem list). Pt needs skilled PT to maximize independence and safety. Significant decline in mobility from her baseline. Patient will benefit from continued inpatient follow up therapy, <3 hours/day          If plan is discharge home, recommend the following: Two people to help with walking and/or transfers;Two people to help with bathing/dressing/bathroom;Assist for transportation;Direct supervision/assist for medications management;Direct supervision/assist for financial management;Assistance with cooking/housework   Can travel by private vehicle   No    Equipment Recommendations Other (comment) (To be determined)  Recommendations for Other Services       Functional Status Assessment Patient has had a recent decline in their functional status and demonstrates the ability to make significant improvements in function in a reasonable and predictable amount of time.     Precautions / Restrictions Precautions Precautions: Fall Precaution Comments: cortrak Restrictions Weight Bearing Restrictions: No      Mobility  Bed Mobility Overal bed mobility: Needs Assistance Bed Mobility: Rolling, Sidelying to Sit, Sit to Supine Rolling: Max assist, +2 for physical assistance, +2 for safety/equipment Sidelying to sit: Max assist, +2 for safety/equipment, +2 for physical assistance   Sit to  supine: Max assist, +2 for physical assistance, +2 for safety/equipment   General bed mobility comments: Assist to bring legs off bed, elevate trunk into sitting and bring hips to EOB. Assist to lower trunk and bring legs back up into bed.    Transfers Overall transfer level: Needs assistance Equipment used: 2 person hand held assist Transfers: Sit to/from Stand Sit to Stand: Max assist, +2 safety/equipment, +2 physical assistance           General transfer comment: Assist to bring hips up with pt not fully standing and posteior thighs and hips propped on bed.    Ambulation/Gait               General Gait Details: Unable  Stairs            Wheelchair Mobility     Tilt Bed    Modified Rankin (Stroke Patients Only)       Balance Overall balance assessment: Needs assistance Sitting-balance support: Feet supported Sitting balance-Leahy Scale: Poor Sitting balance - Comments: min - max A for sitting balance. Pt with sporadicly leaning anterior or posteriorly fairly rapidly.   Standing balance support: Bilateral upper extremity supported, During functional activity Standing balance-Leahy Scale: Zero Standing balance comment: Unable to fully stand with +2 max assist                             Pertinent Vitals/Pain Pain Assessment Pain Assessment: No/denies pain    Home Living Family/patient expects to be discharged to:: Private residence Living Arrangements: Spouse/significant other Available Help at Discharge: Family Type of Home: House         Home Layout: Two level;Bed/bath upstairs;Able to  live on main level with bedroom/bathroom   Additional Comments: Unable to get full home set up from pt    Prior Function Prior Level of Function : Independent/Modified Independent;Patient poor historian/Family not available             Mobility Comments: reports no AD ADLs Comments: reports indep, husband completes IADLs      Extremity/Trunk Assessment   Upper Extremity Assessment Upper Extremity Assessment: Defer to OT evaluation RUE Deficits / Details: difficult to assess, pt moving RUE through full ROM. Globally 4/5 LUE Deficits / Details: difficult to assess, pt not moving LUE as well as R. Assume residual deficits from prior CVA LUE Coordination: decreased fine motor;decreased gross motor    Lower Extremity Assessment Lower Extremity Assessment: Generalized weakness    Cervical / Trunk Assessment Cervical / Trunk Assessment: Kyphotic  Communication   Communication Communication: Difficulty communicating thoughts/reduced clarity of speech (difficult to understand) Cueing Techniques: Verbal cues  Cognition Arousal: Lethargic Behavior During Therapy: Flat affect Overall Cognitive Status: Difficult to assess                                 General Comments: Pt able to state name, birthday, "hopsital" and give the year out of 3 options. Pt required maximal cues and stimulation to participate in mobility. Lethargic with eyes closed much of the session, arousal improved slightly with upright posture        General Comments General comments (skin integrity, edema, etc.): Pt on 4-5L O2. SpO2 86-90%    Exercises     Assessment/Plan    PT Assessment Patient needs continued PT services  PT Problem List Decreased strength;Decreased activity tolerance;Decreased balance;Decreased mobility;Decreased cognition;Decreased safety awareness;Decreased knowledge of precautions       PT Treatment Interventions DME instruction;Gait training;Functional mobility training;Therapeutic activities;Balance training;Therapeutic exercise;Cognitive remediation;Patient/family education    PT Goals (Current goals can be found in the Care Plan section)  Acute Rehab PT Goals Patient Stated Goal: Unable to state PT Goal Formulation: Patient unable to participate in goal setting Time For Goal Achievement:  01/05/23 Potential to Achieve Goals: Good    Frequency Min 1X/week     Co-evaluation PT/OT/SLP Co-Evaluation/Treatment: Yes Reason for Co-Treatment: Complexity of the patient's impairments (multi-system involvement);For patient/therapist safety;To address functional/ADL transfers PT goals addressed during session: Mobility/safety with mobility;Balance OT goals addressed during session: ADL's and self-care       AM-PAC PT "6 Clicks" Mobility  Outcome Measure Help needed turning from your back to your side while in a flat bed without using bedrails?: A Lot Help needed moving from lying on your back to sitting on the side of a flat bed without using bedrails?: A Lot Help needed moving to and from a bed to a chair (including a wheelchair)?: Total Help needed standing up from a chair using your arms (e.g., wheelchair or bedside chair)?: Total Help needed to walk in hospital room?: Total Help needed climbing 3-5 steps with a railing? : Total 6 Click Score: 8    End of Session Equipment Utilized During Treatment: Oxygen Activity Tolerance: Patient limited by lethargy Patient left: in bed;with call bell/phone within reach;with bed alarm set Nurse Communication: Mobility status PT Visit Diagnosis: Unsteadiness on feet (R26.81);Other abnormalities of gait and mobility (R26.89);Muscle weakness (generalized) (M62.81)    Time: 1610-9604 PT Time Calculation (min) (ACUTE ONLY): 36 min   Charges:   PT Evaluation $PT Eval Moderate Complexity: 1 Mod  PT General Charges $$ ACUTE PT VISIT: 1 Visit         [facility] PT Acute Rehabilitation Services Office [phone]   Angelina Ok [facility] 12/22/2022, 3:47 PM

## 2022-12-22 NOTE — TOC Progression Note (Addendum)
Transition of Care [facility]) - Progression Note    Patient Details  Name: Krystal Peterson MRN: [id number] Date of Birth: [birth year]  Transition of Care [facility]) CM/SW Contact  Tom-Johnson, Hershal Coria, RN Phone Number: 12/22/2022, 1:33 PM  Clinical Narrative:     Patient underwent one-way extubation today, on 3L O2. Husband and sister at bedside.     Patient not Medically ready for discharge.  CM will continue to follow.     Expected Discharge Plan and Services                                               Social Determinants of Health (SDOH) Interventions SDOH Screenings   Food Insecurity: Low Risk  (11/14/2022)   Received from [facility]  Utilities: Low Risk  (11/14/2022)   Received from [facility]  Tobacco Use: Medium Risk (09/04/2022)   Received from [facility]    Readmission Risk Interventions    12/15/2022    3:43 PM  Readmission Risk Prevention Plan  Post Dischage Appt Complete  Medication Screening Complete  Transportation Screening Complete

## 2022-12-23 ENCOUNTER — Inpatient Hospital Stay (HOSPITAL_COMMUNITY): Payer: Medicare Other

## 2022-12-23 DIAGNOSIS — J9601 Acute respiratory failure with hypoxia: Secondary | ICD-10-CM | POA: Diagnosis not present

## 2022-12-23 DIAGNOSIS — G40901 Epilepsy, unspecified, not intractable, with status epilepticus: Principal | ICD-10-CM

## 2022-12-23 DIAGNOSIS — F10939 Alcohol use, unspecified with withdrawal, unspecified: Secondary | ICD-10-CM | POA: Diagnosis not present

## 2022-12-23 DIAGNOSIS — E43 Unspecified severe protein-calorie malnutrition: Secondary | ICD-10-CM | POA: Diagnosis not present

## 2022-12-23 LAB — GLUCOSE, CAPILLARY
Glucose-Capillary: 102 mg/dL — ABNORMAL HIGH (ref 70–99)
Glucose-Capillary: 96 mg/dL (ref 70–99)
Glucose-Capillary: 108 mg/dL — ABNORMAL HIGH (ref 70–99)
Glucose-Capillary: 107 mg/dL — ABNORMAL HIGH (ref 70–99)
Glucose-Capillary: 97 mg/dL (ref 70–99)

## 2022-12-23 MED ORDER — POTASSIUM CHLORIDE 10 MEQ/50ML IV SOLN
10.0000 meq | INTRAVENOUS | Status: AC
  Administered 2022-12-23 (×4): 10 meq via INTRAVENOUS
  Filled 2022-12-23 (×2): qty 50

## 2022-12-23 MED ORDER — FUROSEMIDE 10 MG/ML IJ SOLN
40.0000 mg | Freq: Once | INTRAMUSCULAR | Status: AC
  Administered 2022-12-23: 40 mg via INTRAVENOUS
  Filled 2022-12-23: qty 4

## 2022-12-23 MED ORDER — POTASSIUM CHLORIDE 20 MEQ PO PACK
20.0000 meq | PACK | ORAL | Status: AC
  Administered 2022-12-23 (×3): 20 meq
  Filled 2022-12-23 (×3): qty 1

## 2022-12-23 MED ORDER — THIAMINE MONONITRATE 100 MG PO TABS
100.0000 mg | ORAL_TABLET | Freq: Every day | ORAL | Status: DC
  Administered 2022-12-24 – 2023-01-02 (×10): 100 mg
  Filled 2022-12-23 (×11): qty 1

## 2022-12-23 MED ORDER — POTASSIUM CHLORIDE 20 MEQ PO PACK
20.0000 meq | PACK | ORAL | Status: DC
  Administered 2022-12-23: 20 meq
  Filled 2022-12-23: qty 1

## 2022-12-23 MED ORDER — FUROSEMIDE 10 MG/ML IJ SOLN
40.0000 mg | Freq: Two times a day (BID) | INTRAMUSCULAR | Status: DC
  Administered 2022-12-23 – 2022-12-24 (×3): 40 mg via INTRAVENOUS
  Filled 2022-12-23 (×3): qty 4

## 2022-12-23 MED ORDER — OSMOLITE 1.5 CAL PO LIQD
1000.0000 mL | ORAL | Status: DC
  Administered 2022-12-24 – 2022-12-29 (×6): 1000 mL
  Filled 2022-12-23 (×2): qty 1000

## 2022-12-23 MED ORDER — MELATONIN 3 MG PO TABS
3.0000 mg | ORAL_TABLET | Freq: Once | ORAL | Status: AC
  Administered 2022-12-23: 3 mg
  Filled 2022-12-23: qty 1

## 2022-12-23 MED ORDER — ORAL CARE MOUTH RINSE: 15.0000 mL | OROMUCOSAL | Status: DC | PRN

## 2022-12-23 NOTE — Progress Notes (Signed)
Turned off tube feeds due to aspiration risk. Pt turns on to her stomach throughout the night and hard to keep HOB at 30 degree angle or higher as she wiggles down to a flatter part of bed. MD notified and OK to turn off TF.

## 2022-12-23 NOTE — Progress Notes (Signed)
eLink Physician-Brief Progress Note Patient Name: Krystal Peterson DOB: December 27, 1955 MRN: [id number]   Date of Service  12/23/2022  HPI/Events of Note  Request for melatonin   eICU Interventions  Order placed     Intervention Category Intermediate Interventions: Medication change / dose adjustment  Oretha Milch 12/23/2022, 7:45 PM

## 2022-12-23 NOTE — Progress Notes (Signed)
[facility] ADULT ICU REPLACEMENT PROTOCOL   The patient does apply for the [facility] Adult ICU Electrolyte Replacment Protocol based on the criteria listed below:   1.Exclusion criteria: TCTS, ECMO, Dialysis, and Myasthenia Gravis patients 2. Is GFR >/= 30 ml/min? Yes.    Patient's GFR today is >60 3. Is SCr </= 2? No. Patient's SCr is 0.77 mg/dL 4. Did SCr increase >/= 0.5 in 24 hours? No. 5.Pt's weight >40kg  Yes.   6. Abnormal electrolyte(s): K+3.3  7. Electrolytes replaced per protocol 8.  Call MD STAT for K+ </= 2.5, Phos </= 1, or Mag </= 1 Physician:  Dr. Stephannie Li, Lilia Argue 12/23/2022 6:05 AM

## 2022-12-23 NOTE — Progress Notes (Signed)
NAME:  Krystal Peterson, MRN:  [id number], DOB:  October 26, 1955, LOS: 9 ADMISSION DATE:  12/13/2022, CONSULTATION DATE:  12/21/22 REFERRING MD:  EDP, CHIEF COMPLAINT:  seizure   History of Present Illness:  67 year old lady very limited care at [facility] health.  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  Then husband witnessed seizure by the husband with a left gaze.  Tonic-clonic generalized.   Husband did CPR on advise by EMS for 15 min though he felt she did not lose pulse. Had 1 more episode of seizure on arrival via EMS.  In the ER unresponsive and intubated.  Per ED.  Neuro does not think patient has acute stroke  Pertinent  Medical History  Alcohol use disorder Tobacco used disorder Prolonged QT with cardiac arrest s/p ICD HTN SCC of the tongue s/p chemo, radiation Rheumatoid Arthritis  Significant Hospital Events: Including procedures, antibiotic start and stop dates in addition to other pertinent events   8/4 admitted- presented with stroke like symptoms after a seizure. Had been off ETOH due to n/v/d. 8/5 extubated 8/6 remains agitated but can answer many questions and is periodically directable 8/6-8.7 reintubated overnight, CVC 8/7 polymorphic VT x 2, aborted with AICD shocks. Probably explained by lytes. Qtc WNL. 8/9  ongoing agitation, failed SBT on fent gtt> stopped, precedex> propofol 8/10 occluded ETT 2/2 secretions s/p bronch/ reintubation, ongoing agtitation 8/12 extubated  Interim History / Subjective:  Able to speak with me intermittently this morning. Has done well since extubation.  Objective   Blood pressure (!) 88/63, pulse 87, temperature 97.8 F (36.6 C), temperature source Axillary, resp. rate (!) 23, height 5' 1.89" (1.572 m), weight 51.5 kg, SpO2 95%.        Intake/Output Summary (Last 24 hours) at 12/23/2022 6301 Last data filed at 12/23/2022 0400 Gross per 24 hour  Intake 1267.3 ml  Output 3865 ml  Net -2597.7 ml   Filed Weights   12/21/22 0430 12/22/22 0500 12/23/22 0500  Weight: 61.3 kg 58.7 kg 51.5 kg    Examination: General: Lying in bed, NAD HENT: Cortrak in place, NCAT Lungs: CTAB anteriorly on my exam, no w/r/r Cardiovascular: RRR, no m/r/g Abdomen: Soft, nondistended, normoactive bowel sounds Neuro: Alert though drowsy, oriented only to self  Resolved Hospital Problem list   AKI-resolved Nausea, vomiting, suspected gastroenteritis Troponin elevation, most likely due to seizure activity Lactic acidosis most likely due to seizure Hypoglycemia Septic shock Constipation  Assessment & Plan:  Acute toxic/septic encephalopathy, possible some component of initial ETOH withdrawal now complicated sepsis due to MSSA pneumonia Seizures, likely due to alcohol withdrawal Prior history of stroke Right parietal cyst, follows with NSGY Unable to obtain brain MRI due to AICD incompatibility Completed phenobarb taper for alcohol withdrawal Stop oxycodone given could be contributing to encephalopathy   Polymorphic VT likely due to hypokalemia, hypomagnesemia Prior history of VF Arrest 2015 s/p ICD placement,  prolonged QT syndrome Continue telemetry monitoring   Acute hypoxemic/hypercapnic respiratory failure requiring mechanical ventilation Severe sepsis with septic  shock MSSA PNA, not POA Mucous plugging Tobacco dependence Hypercapnia has cleared Patient extubated 8/12 Continue IV cefazolin Rectal temp 102 night of 8/12 with increased O2 requirement 9L CXR with small right pleural effusion with bibasilar opacities/atelectasis, lasix 40 mg ordered Continue nicotine patch to prevent nicotine withdrawal Shock has resolved, remain off vasopressor support   Anxiety and depression Continue Lexapro   HTN Holding antihypertensive meds for now   Hypothyroidism con't synthroid   Previous tongue cancer  Severe protein calorie malnutrition Tube feeds initially stopped due to aspiration risk overnight; however, will bolus feeds throughout the day to mimic physiology  Patient is stable for transfer out of ICU. Will sign out to [facility].  Best Practice (right click and "Reselect all SmartList Selections" daily)   Diet/type: NPO DVT prophylaxis: LMWH GI prophylaxis: PPI Lines: PICC line Foley: Discontinued 8/12 Code Status:  full code Last date of multidisciplinary goals of care discussion [8/12: Patient's husband was updated at bedside, decision was to proceed with one-way extubation]  Labs   CBC: Recent Labs  Lab 12/19/22 0458 12/20/22 0038 12/20/22 1407 12/21/22 0852 12/21/22 1117 12/22/22 0443 12/23/22 0358  WBC 19.0* 16.1*  --  16.4*  --  20.1* 16.0*  HGB 10.5* 9.8* 11.9* 10.9* 13.9 11.2* 11.6*  HCT 31.7* 29.1* 35.0* 32.4* 41.0 33.3* 34.5*  MCV 104.3* 104.7*  --  103.8*  --  102.1* 101.5*  PLT 244 225  --  292  --  366 384    Basic Metabolic Panel: Recent Labs  Lab 12/18/22 0014 12/18/22 0420 12/19/22 0458 12/20/22 0038 12/20/22 1407 12/20/22 2030 12/21/22 0444 12/21/22 1117 12/22/22 0443 12/23/22 0358  NA  --    < > 140 140 142  --  143 143 140 140  K  --    < > 4.1 3.6 3.5  --  3.4* 3.3* 4.3 3.3*  CL  --    < > 104 108  --   --  105  --  101 104  CO2  --    < > 25 23  --   --  26  --  26 26  GLUCOSE  --    < > 81 101*   --   --  87  --  90 94  BUN  --    < > 21 17  --   --  13  --  15 14  CREATININE  --    < > 1.05* 1.13*  --   --  0.91  --  0.78 0.77  CALCIUM  --    < > 8.5* 8.4*  --   --  8.0*  --  8.5* 7.2*  MG 2.5*  --  2.1 1.9  --   --  1.9  --  2.3  --   PHOS 2.4*  --  2.1* 1.4*  --  5.0* 3.1  --  4.2 3.3   < > = values in this interval not displayed.   Liver Function Tests: Recent Labs  Lab 12/22/22 0443 12/23/22 0358  ALBUMIN 1.8* 1.6*    Recent Labs  Lab 12/17/22 0037  AMMONIA 20    ABG    Component Value Date/Time   PHART 7.438 12/21/2022 1117   PCO2ART 41.4 12/21/2022 1117   PO2ART 66 (L) 12/21/2022 1117   HCO3 28.0 12/21/2022 1117   TCO2 29 12/21/2022  1117   ACIDBASEDEF 6.0 (H) 12/17/2022 0805   O2SAT 93 12/21/2022 1117    HbA1C: Hgb A1c MFr Bld  Date/Time Value Ref Range Status  12/14/2022 03:58 AM 4.6 (L) 4.8 - 5.6 % Final    Comment:    (NOTE) Pre diabetes:          5.7%-6.4%  Diabetes:              >6.4%  Glycemic control for   <7.0% adults with diabetes     CBG: Recent Labs  Lab 12/22/22 1557 12/22/22 2013 12/22/22 2305 12/23/22 0402 12/23/22 0726  GLUCAP 115* 120* 118* 102* 96    Critical care time:     Janeal Holmes, MD

## 2022-12-23 NOTE — Progress Notes (Addendum)
eLink Physician-Brief Progress Note Patient Name: DUANNA SANDVIG DOB: [birth year] MRN: [id number]   Date of Service  12/23/2022  HPI/Events of Note  -Extubated yesterday. Increased O2 requirement 9L -New fever with rectal temp 102 -Restless -Currently receiving TF  SBP 120s  eICU Interventions  -Give tylenol -Continue Ancef -DC tube feeds -Continue nebs -CXR ordered   CXR 12/23/22 ETT removed. Small right pleural effusion. Bibasilar opacities/atelectasis. Lasix 40 mg re-ordered  Intervention Category Intermediate Interventions: Infection - evaluation and management Minor Interventions: Agitation / anxiety - evaluation and management   Mechele Collin 12/23/2022, 12:37 AM

## 2022-12-23 NOTE — Progress Notes (Signed)
Nutrition Follow-up  DOCUMENTATION CODES:   Severe malnutrition in context of chronic illness  INTERVENTION:  - Modify EN regimen to cyclic feeds of Osmolite 1.5 @ 83 mL/hr x12 hrs (0600-1800)  - Continue 60 mL Prosource TF20 daily  - This will provide a total of ( ) 1580 kcals, 83 gm protein and 765 mL free water.   NUTRITION DIAGNOSIS:   Severe Malnutrition related to chronic illness (squamous cell carcinoma of the tongue s/p radiation, EtOH abuse) as evidenced by severe fat depletion, severe muscle depletion.  GOAL:   Patient will meet greater than or equal to 90% of their needs  MONITOR:   Vent status, Labs, Weight trends, TF tolerance, I & O's  REASON FOR ASSESSMENT:   Consult Enteral/tube feeding initiation and management  ASSESSMENT:   67 y.o. female presented to the ED with seizures. 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 Pt admitted with seizures.  Meds reviewed: colace, folic acid, lasix, sliding scale insulin, MVI, miralax, klor-con, thiamine. Labs reviewed: K low.   Pt was extubated yesterday. Pt remains NPO with Cortrak in place. SLP is following. Pt is currently oriented x1. RN reports that the tube feeds were stopped early this am due to pt's positioning in the bed at night. TF were restarted at goal rate of 55 mL/hr. RD will modify pt over to daily cyclic feeds and change formula to Osmolite 1.5 to minimize volume. Will continue to monitor TF tolerance.   Diet Order:   Diet Order             Diet NPO time specified  Diet effective now                   EDUCATION NEEDS:   Not appropriate for education at this time  Skin:  Skin Assessment: Reviewed RN Assessment (abrasion to left upper flank)  Last BM:  8/12 - type 7  Height:   Ht Readings from Last 1 Encounters:  12/19/22 5' 1.89" (1.572 m)    Weight:   Wt Readings from Last 1 Encounters:  12/23/22 51.5 kg    Ideal  Body Weight:  50 kg  BMI:  Body mass index is 20.84 kg/m.  Estimated Nutritional Needs:   Kcal:  1600-1800  Protein:  80-90 grams  Fluid:  1.6-1.8 L  Bethann Humble, RD, LDN, CNSC.

## 2022-12-23 NOTE — Progress Notes (Signed)
SLP Cancellation Note  Patient Details Name: Krystal Peterson MRN: [id number] DOB: 24-Dec-1955   Cancelled treatment:       Reason Eval/Treat Not Completed: Fatigue/lethargy limiting ability to participate. Pt sleeping, no improvement from yesterday yet per RN, will f/u tomorrow.    , [name] 12/23/2022, 9:52 AM

## 2022-12-24 DIAGNOSIS — A419 Sepsis, unspecified organism: Secondary | ICD-10-CM

## 2022-12-24 DIAGNOSIS — R6521 Severe sepsis with septic shock: Secondary | ICD-10-CM

## 2022-12-24 DIAGNOSIS — J15211 Pneumonia due to Methicillin susceptible Staphylococcus aureus: Secondary | ICD-10-CM

## 2022-12-24 DIAGNOSIS — G40901 Epilepsy, unspecified, not intractable, with status epilepticus: Secondary | ICD-10-CM | POA: Diagnosis not present

## 2022-12-24 DIAGNOSIS — J9601 Acute respiratory failure with hypoxia: Secondary | ICD-10-CM | POA: Diagnosis not present

## 2022-12-24 LAB — MAGNESIUM: Magnesium: 2.6 mg/dL — ABNORMAL HIGH (ref 1.7–2.4)

## 2022-12-24 LAB — COMPREHENSIVE METABOLIC PANEL
ALT: 47 U/L — ABNORMAL HIGH (ref 0–44)
AST: 115 U/L — ABNORMAL HIGH (ref 15–41)
Albumin: 2.2 g/dL — ABNORMAL LOW (ref 3.5–5.0)
Alkaline Phosphatase: 185 U/L — ABNORMAL HIGH (ref 38–126)
Anion gap: 17 — ABNORMAL HIGH (ref 5–15)
BUN: 34 mg/dL — ABNORMAL HIGH (ref 8–23)
CO2: 29 mmol/L (ref 22–32)
Calcium: 9.1 mg/dL (ref 8.9–10.3)
Chloride: 93 mmol/L — ABNORMAL LOW (ref 98–111)
Creatinine, Ser: 1.31 mg/dL — ABNORMAL HIGH (ref 0.44–1.00)
GFR, Estimated: 45 mL/min — ABNORMAL LOW (ref >60)
Glucose, Bld: 200 mg/dL — ABNORMAL HIGH (ref 70–99)
Potassium: 4 mmol/L (ref 3.5–5.1)
Sodium: 139 mmol/L (ref 135–145)
Total Bilirubin: 0.4 mg/dL (ref 0.3–1.2)
Total Protein: 7.4 g/dL (ref 6.5–8.1)

## 2022-12-24 LAB — PHOSPHORUS: Phosphorus: 4.4 mg/dL (ref 2.5–4.6)

## 2022-12-24 LAB — CBC
HCT: 39.3 % (ref 36.0–46.0)
Hemoglobin: 12.9 g/dL (ref 12.0–15.0)
MCH: 33.9 pg (ref 26.0–34.0)
MCHC: 32.8 g/dL (ref 30.0–36.0)
MCV: 103.4 fL — ABNORMAL HIGH (ref 80.0–100.0)
Platelets: 598 10*3/uL — ABNORMAL HIGH (ref 150–400)
RBC: 3.8 MIL/uL — ABNORMAL LOW (ref 3.87–5.11)
RDW: 15.8 % — ABNORMAL HIGH (ref 11.5–15.5)
WBC: 16.3 10*3/uL — ABNORMAL HIGH (ref 4.0–10.5)
nRBC: 0 % (ref 0.0–0.2)

## 2022-12-24 LAB — GLUCOSE, CAPILLARY
Glucose-Capillary: 117 mg/dL — ABNORMAL HIGH (ref 70–99)
Glucose-Capillary: 135 mg/dL — ABNORMAL HIGH (ref 70–99)
Glucose-Capillary: 107 mg/dL — ABNORMAL HIGH (ref 70–99)

## 2022-12-24 LAB — CK: Total CK: 41 U/L (ref 38–234)

## 2022-12-24 MED ORDER — ATORVASTATIN CALCIUM 40 MG PO TABS
40.0000 mg | ORAL_TABLET | Freq: Every evening | ORAL | Status: DC
  Administered 2022-12-24 – 2022-12-28 (×5): 40 mg
  Filled 2022-12-24 (×5): qty 1

## 2022-12-24 MED ORDER — ATORVASTATIN CALCIUM 40 MG PO TABS: 40.0000 mg | ORAL_TABLET | Freq: Every evening | ORAL | Status: DC

## 2022-12-24 NOTE — Progress Notes (Signed)
SLP Cancellation Note  Patient Details Name: PERSIA FIFIELD MRN: [id number] DOB: [birth year]   Cancelled treatment:       Reason Eval/Treat Not Completed: Fatigue/lethargy limiting ability to participate   Claudine Mouton 12/24/2022, 2:30 PM

## 2022-12-24 NOTE — Progress Notes (Signed)
PROGRESS NOTE  Krystal Peterson SWN:[id number] DOB: Nov 29, 1955   PCP: Pediatrics, [place]  Patient is from: Home.  Lives with husband.  DOA: 12/13/2022 LOS: 10  Chief complaints Chief Complaint  Patient presents with   Code Stroke     Brief Narrative / Interim history: 67 year old F with PMH of V-fib arrest in 2015 s/p ICD, prolonged QT, cryptogenic PCA CVA, SCC of tongue s/p radiation in 2015, prior alcohol use, rheumatoid arthritis, HTN, HLD, hypothyroidism, depression and tobacco use disorder presenting with nausea, vomiting, diarrhea, left gaze and tonic-clonic seizure, and admitted to ICU for seizure.  She was started on Keppra.  Apparently husband did CPR for 15 minutes per recommendation by EMS though he fell she did not lose pulse.  On arrival to ED, she was unresponsive and intubated and admitted to ICU.  Patient was extubated on 8/5 but remained agitated and reintubated.  Hospital course complicated by septic shock due to MSSA pneumonia, polymorphic VT x 2 on 8/7 aborted by AICD shock and prolonged agitation requiring mechanical ventilation and sedation.  Eventually, she came off vasopressors and sedation.  She was extubated on 8/12 and transferred to [facility] on 8/14.   Remains on 10 L by HFNC.  She is on tube feed via cortrack.  Completing course of Ancef on 8/15 for MSSA pneumonia.    Subjective: Seen and examined earlier this morning.  No major events overnight of this morning.  Patient is sleepy but wakes to voice.  She is oriented to self and her husband but not place.  Follows commands.  No complaints.  Currently on 9 L saturating at 94%.  Objective: Vitals:   12/24/22 0600 12/24/22 0712 12/24/22 0720 12/24/22 1139  BP: (!) 86/63     Pulse: (!) 102     Resp: (!) 31 (!) 26    Temp:   (!) 97.5 F (36.4 C) (!) 97.5 F (36.4 C)  TempSrc:   Axillary Axillary  SpO2: 97%     Weight:      Height:        Examination:  GENERAL: Appears  frail.  No apparent distress. HEENT: MMM.  Vision and hearing grossly intact.  Cortrack in place NECK: Supple.  No apparent JVD.  RESP:  No IWOB.  Fair aeration bilaterally. CVS:  RRR. Heart sounds normal.  ABD/GI/GU: BS+. Abd soft, NTND.  MSK/EXT:  Moves extremities. No apparent deformity. No edema.  SKIN: no apparent skin lesion or wound NEURO: Sleepy but wakes to voice.  Oriented to self and husband.  Follows commands.  No apparent focal neuro deficit. PSYCH: Calm. Normal affect.   Procedures:  8/4-8/12 intubation and mechanical ventilation  Microbiology summarized: 8/4-MRSA PCR screen negative 8/7 and 8/10-respiratory culture with Staph aureus resistant to erythromycin, clindamycin  Assessment and plan: Principal Problem:   Seizure (HCC) Active Problems:   Protein-calorie malnutrition, severe   Acute respiratory failure with hypoxia (HCC)   Status epilepticus (HCC)   Alcohol withdrawal seizure with complication (HCC)  Acute toxic/septic encephalopathy: In the setting of seizure, possible EtOH withdrawal, ICU delirium and sepsis due to MSSA bacteremia: CT head without acute finding.  Unable to obtain MRI due to AICD incompatibility.  Improved.  Wakes to voice and oriented to self and person.  Follows commands.  No focal neurodeficit. -Treat treatable causes -Reorientation and delirium precaution -Minimize or avoid sedating medications -Continue tube feed pending SLP eval and recommendation  Severe sepsis with septic shock due to MSSA pneumonia: Not POA.  -  Completed antibiotic course with IV Ancef on 8/15 -Pulmonary toilet  Seizure likely due to alcohol withdrawal: EEG without active seizure but evidence of potential epileptogenicity arising from the right temporal region and moderate diffuse encephalopathy. -Continue Keppra 1 g twice daily per neurology  EtOH withdrawal with agitation and seizure -Completed phenobarbital taper -Continue multivitamin, folic acid and  thiamine    Polymorphic VT likely due to hypokalemia, hypomagnesemia: Aborted by ICD shock Prior history of VF Arrest 2015 s/p ICD placement, prolonged QT syndrome -Continue telemetry monitoring -Avoid QT prolonging drugs -Optimize electrolytes   Acute hypoxemic/hypercapnic respiratory failure requiring mechanical ventilation in the setting of MSSA pneumonia, sepsis, mucous plugging and seizure.  On 9 L by HFNC.  TTE without significant finding.  Appears euvolemic on exam.  Soft blood pressures. -Wean oxygen as able -Discontinue IV Lasix. -Continue scheduled and as needed nebulizers -Aspiration precaution -N.p.o. pending SLP eval -Continue Ancef as previously planned  Tobacco dependence -Encouraged smoking cessation when able to comprehend -Continue nicotine patch  Physical deconditioning in the setting of acute illness -PT/OT eval   Prior history of stroke: CT head without acute finding.  Not able to obtain MRI due to AICD.  No focal neurodeficit. -Resume Lipitor -Continue aspirin  Right parietal cyst,  -follows with NSGY  Anxiety and depression Continue Lexapro   HTN: Currently hypotensive. -Hold antihypertensive meds and diuretics due to hypotension   Hypothyroidism con't synthroid   Previous tongue cancer s/p chemoradiation in 2015 -Outpatient follow-up  Severe protein calorie malnutrition Body mass index is 20.84 kg/m. Nutrition Problem: Severe Malnutrition Etiology: chronic illness (squamous cell carcinoma of the tongue s/p radiation, EtOH abuse) Signs/Symptoms: severe fat depletion, severe muscle depletion Interventions: Tube feeding, MVI   DVT prophylaxis:  enoxaparin (LOVENOX) injection 40 mg Start: 12/17/22 0915  Code Status: DNR/DNI Family Communication: Updated patient's husband at bedside Level of care: Telemetry Medical Status is: Inpatient Remains inpatient appropriate because: Respiratory failure with significant oxygen requirement, dysphagia,  encephalopathy and seizure   Final disposition: TBD Consultants:  Pulmonology admitted patient Neurology  55 minutes with more than 50% spent in reviewing records, counseling patient/family and coordinating care.   Sch Meds:  Scheduled Meds:  arformoterol  15 mcg Nebulization BID   aspirin  81 mg Per Tube Daily   bethanechol  10 mg Per Tube TID   budesonide (PULMICORT) nebulizer solution  0.5 mg Nebulization BID   Chlorhexidine Gluconate Cloth  6 each Topical Daily   docusate  100 mg Per Tube QHS   enoxaparin (LOVENOX) injection  40 mg Subcutaneous Q24H   escitalopram  20 mg Per Tube Daily   feeding supplement (OSMOLITE 1.5 CAL)  1,000 mL Per Tube Q24H   feeding supplement (PROSource TF20)  60 mL Per Tube Daily   folic acid  1 mg Per Tube Daily   furosemide  40 mg Intravenous Q12H   guaiFENesin  15 mL Per Tube Q6H   levETIRAcetam  1,000 mg Per Tube BID   levothyroxine  100 mcg Per Tube Q0600   lidocaine  2 patch Transdermal Q24H   multivitamin with minerals  1 tablet Per Tube Daily   nicotine  14 mg Transdermal Daily   polyethylene glycol  17 g Per Tube QHS   revefenacin  175 mcg Nebulization Daily   sodium chloride flush  10-40 mL Intracatheter Q12H   thiamine  100 mg Per Tube Daily   Continuous Infusions:  sodium chloride Stopped (12/15/22 0859)    ceFAZolin (ANCEF) IV 2  g (12/24/22 1044)   PRN Meds:.acetaminophen, albuterol, bisacodyl, mouth rinse, sodium chloride flush  Antimicrobials: Anti-infectives (From admission, onward)    Start     Dose/Rate Route Frequency Ordered Stop   12/20/22 1600  ceFAZolin (ANCEF) IVPB 2g/100 mL premix        2 g 200 mL/hr over 30 Minutes Intravenous Every 8 hours 12/20/22 1517 12/25/22 2359   12/19/22 1700  ceFAZolin (ANCEF) IVPB 2g/100 mL premix  Status:  Discontinued        2 g 200 mL/hr over 30 Minutes Intravenous Every 12 hours 12/19/22 1342 12/20/22 1517   12/17/22 0600  piperacillin-tazobactam (ZOSYN) IVPB 3.375 g  Status:   Discontinued        3.375 g 12.5 mL/hr over 240 Minutes Intravenous Every 8 hours 12/17/22 0449 12/19/22 1342        I have personally reviewed the following labs and images: CBC: Recent Labs  Lab 12/19/22 0458 12/20/22 0038 12/20/22 1407 12/21/22 0852 12/21/22 1117 12/22/22 0443 12/23/22 0358  WBC 19.0* 16.1*  --  16.4*  --  20.1* 16.0*  HGB 10.5* 9.8* 11.9* 10.9* 13.9 11.2* 11.6*  HCT 31.7* 29.1* 35.0* 32.4* 41.0 33.3* 34.5*  MCV 104.3* 104.7*  --  103.8*  --  102.1* 101.5*  PLT 244 225  --  292  --  366 384   BMP &GFR Recent Labs  Lab 12/18/22 0014 12/18/22 0420 12/19/22 0458 12/20/22 0038 12/20/22 1407 12/20/22 2030 12/21/22 0444 12/21/22 1117 12/22/22 0443 12/23/22 0358  NA  --    < > 140 140 142  --  143 143 140 140  K  --    < > 4.1 3.6 3.5  --  3.4* 3.3* 4.3 3.3*  CL  --    < > 104 108  --   --  105  --  101 104  CO2  --    < > 25 23  --   --  26  --  26 26  GLUCOSE  --    < > 81 101*  --   --  87  --  90 94  BUN  --    < > 21 17  --   --  13  --  15 14  CREATININE  --    < > 1.05* 1.13*  --   --  0.91  --  0.78 0.77  CALCIUM  --    < > 8.5* 8.4*  --   --  8.0*  --  8.5* 7.2*  MG 2.5*  --  2.1 1.9  --   --  1.9  --  2.3  --   PHOS 2.4*  --  2.1* 1.4*  --  5.0* 3.1  --  4.2 3.3   < > = values in this interval not displayed.   Estimated Creatinine Clearance: 53.6 mL/min (by C-G formula based on SCr of 0.77 mg/dL). Liver & Pancreas: Recent Labs  Lab 12/22/22 0443 12/23/22 0358  ALBUMIN 1.8* 1.6*   No results for input(s): "LIPASE", "AMYLASE" in the last 168 hours. No results for input(s): "AMMONIA" in the last 168 hours. Diabetic: No results for input(s): "HGBA1C" in the last 72 hours. Recent Labs  Lab 12/23/22 1507 12/23/22 1944 12/23/22 2320 12/24/22 0319 12/24/22 0719  GLUCAP 107* 97 122* 117* 135*   Cardiac Enzymes: No results for input(s): "CKTOTAL", "CKMB", "CKMBINDEX", "TROPONINI" in the last 168 hours. No results for input(s):  "PROBNP" in the last 8760  hours. Coagulation Profile: No results for input(s): "INR", "PROTIME" in the last 168 hours. Thyroid Function Tests: No results for input(s): "TSH", "T4TOTAL", "FREET4", "T3FREE", "THYROIDAB" in the last 72 hours. Lipid Profile: No results for input(s): "CHOL", "HDL", "LDLCALC", "TRIG", "CHOLHDL", "LDLDIRECT" in the last 72 hours. Anemia Panel: No results for input(s): "VITAMINB12", "FOLATE", "FERRITIN", "TIBC", "IRON", "RETICCTPCT" in the last 72 hours. Urine analysis: No results found for: "COLORURINE", "APPEARANCEUR", "LABSPEC", "PHURINE", "GLUCOSEU", "HGBUR", "BILIRUBINUR", "KETONESUR", "PROTEINUR", "UROBILINOGEN", "NITRITE", "LEUKOCYTESUR" Sepsis Labs: Invalid input(s): "PROCALCITONIN", "LACTICIDVEN"  Microbiology: Recent Results (from the past 240 hour(s))  Culture, Respiratory w Gram Stain     Status: None   Collection Time: 12/17/22  4:52 AM   Specimen: Tracheal Aspirate; Respiratory  Result Value Ref Range Status   Specimen Description TRACHEAL ASPIRATE  Final   Special Requests NONE  Final   Gram Stain   Final    ABUNDANT WBC PRESENT,BOTH PMN AND MONONUCLEAR FEW GRAM POSITIVE COCCI IN CLUSTERS Performed at [facility], [address] [place], [place] [address]    Culture ABUNDANT STAPHYLOCOCCUS AUREUS  Final   Report Status 12/19/2022 FINAL  Final   Organism ID, Bacteria STAPHYLOCOCCUS AUREUS  Final      Susceptibility   Staphylococcus aureus - MIC*    CIPROFLOXACIN <=0.5 SENSITIVE Sensitive     ERYTHROMYCIN RESISTANT Resistant     GENTAMICIN <=0.5 SENSITIVE Sensitive     OXACILLIN <=0.25 SENSITIVE Sensitive     TETRACYCLINE <=1 SENSITIVE Sensitive     VANCOMYCIN <=0.5 SENSITIVE Sensitive     TRIMETH/SULFA <=10 SENSITIVE Sensitive     CLINDAMYCIN RESISTANT Resistant     RIFAMPIN <=0.5 SENSITIVE Sensitive     Inducible Clindamycin POSITIVE Resistant     LINEZOLID 2 SENSITIVE Sensitive     * ABUNDANT STAPHYLOCOCCUS AUREUS  Culture,  Respiratory w Gram Stain     Status: None   Collection Time: 12/20/22 11:37 AM   Specimen: Bronchoalveolar Lavage; Respiratory  Result Value Ref Range Status   Specimen Description BRONCHIAL ALVEOLAR LAVAGE  Final   Special Requests NONE  Final   Gram Stain   Final    RARE SQUAMOUS EPITHELIAL CELLS PRESENT ABUNDANT WBC PRESENT, PREDOMINANTLY PMN RARE GRAM POSITIVE COCCI Performed at [facility], [address], [place], [place] [address]    Culture MODERATE STAPHYLOCOCCUS AUREUS  Final   Report Status 12/22/2022 FINAL  Final   Organism ID, Bacteria STAPHYLOCOCCUS AUREUS  Final      Susceptibility   Staphylococcus aureus - MIC*    CIPROFLOXACIN <=0.5 SENSITIVE Sensitive     ERYTHROMYCIN RESISTANT Resistant     GENTAMICIN <=0.5 SENSITIVE Sensitive     OXACILLIN <=0.25 SENSITIVE Sensitive     TETRACYCLINE <=1 SENSITIVE Sensitive     VANCOMYCIN <=0.5 SENSITIVE Sensitive     TRIMETH/SULFA <=10 SENSITIVE Sensitive     CLINDAMYCIN RESISTANT Resistant     RIFAMPIN <=0.5 SENSITIVE Sensitive     Inducible Clindamycin POSITIVE Resistant     LINEZOLID 2 SENSITIVE Sensitive     * MODERATE STAPHYLOCOCCUS AUREUS    Radiology Studies: No results found.     T.  [facility]  If 7PM-7AM, please contact night-coverage [email or web address] 12/24/2022, 12:32 PM

## 2022-12-24 NOTE — Plan of Care (Signed)
  Problem: Education: Goal: Knowledge of General Education information will improve Description Including pain rating scale, medication(s)/side effects and non-pharmacologic comfort measures Outcome: Progressing   

## 2022-12-24 NOTE — Plan of Care (Signed)
  Problem: Education: Goal: Knowledge of General Education information will improve Description: Including pain rating scale, medication(s)/side effects and non-pharmacologic comfort measures 12/24/2022 2246 by Beryle Flock, RN Outcome: Progressing 12/24/2022 2245 by Beryle Flock, RN Outcome: Progressing

## 2022-12-24 NOTE — Progress Notes (Signed)
Physical Therapy Treatment Patient Details Name: Krystal Peterson MRN: [id number] DOB: 1955-10-14 Today's Date: 12/24/2022   History of Present Illness Pt is 67 year old presented to [facility] on  12/14/22 for with stroke like symptoms after a seizure. Had been off ETOH due to n/v. Pt with severe sepsis with septic shock MSSA PNA. Intubated on admission. Extubated 8/5. Reintubated 8/6. Extubated 8/12.  PMH - CVA, ICD, depression, HTN, etoh use disorder, tongue CA, RA    PT Comments  Pt with continued progress towards acute goals this date. Pt lethargic at start of session with eyes closed, however becoming more awake and alert with mobility. Pt performing bed mobility with up to mod A to come to sit EOB with pr demonstrating improved initiation. Pt able to maintain sitting up EOB with CGA and complete seated LE with cues for technique. Pt able to comet to stand x4 with HHA and take steps toward HOB, SpO2 dropping on 4L to mid 80s with pt unable to recover with cues for breathing techniques, increased to 6L per RN and pt recovering to 88% once return to supine at end of session with RN aware. Pt continues to be limited by decreased activity tolerance, poor cardiopulmomary endurance, weakness, and impaired cognition. Pt continues to benefit from skilled PT services to progress toward functional mobility goals.     If plan is discharge home, recommend the following: Two people to help with walking and/or transfers;Two people to help with bathing/dressing/bathroom;Assist for transportation;Direct supervision/assist for medications management;Direct supervision/assist for financial management;Assistance with cooking/housework   Can travel by private vehicle     No  Equipment Recommendations  Other (comment) (To be determined)    Recommendations for Other Services       Precautions / Restrictions Precautions Precautions: Fall Precaution Comments: cortrak Restrictions Weight Bearing Restrictions: No      Mobility  Bed Mobility Overal bed mobility: Needs Assistance Bed Mobility: Sit to Supine, Supine to Sit     Supine to sit: Min assist Sit to supine: Min assist   General bed mobility comments: min A to bring LEs to EOB with pt demonstrating good initiation, hand over hand for pt groip bedrail, min A to elevate trunk once pt more alert    Transfers Overall transfer level: Needs assistance Equipment used: 1 person hand held assist Transfers: Sit to/from Stand Sit to Stand: Mod assist, Min assist           General transfer comment: min-mod A to power up and steady, able to complete x4 throughout session with HHA x1 on R. pt able to take some uncoordinated steps up to [facility] with mod A to maintain balance    Ambulation/Gait               General Gait Details: Unable   Stairs             Wheelchair Mobility     Tilt Bed    Modified Rankin (Stroke Patients Only)       Balance Overall balance assessment: Needs assistance Sitting-balance support: Feet supported Sitting balance-Leahy Scale: Fair Sitting balance - Comments: able to maintain static sitting at EOB without assist and perform seated LE therex   Standing balance support: During functional activity, Single extremity supported Standing balance-Leahy Scale: Poor Standing balance comment: HHA and min A to maintain standing balance  Cognition Arousal: Lethargic, Alert (waxing/waning, pt with eyes closed unitl up EOB) Behavior During Therapy: Flat affect Overall Cognitive Status: No family/caregiver present to determine baseline cognitive functioning                                 General Comments: pt A&O x3 disortiented to month, pt needing increased time to open eyes and be fully awake for session, greatly improved by end of session        Exercises General Exercises - Lower Extremity Long Arc Quad: AROM, Right, Left, 10 reps, Seated Hip  ABduction/ADduction: AROM, Right, Left, 10 reps, Seated    General Comments General comments (skin integrity, edema, etc.): pt on 4L on arrival >90%, dropping to low 80s and unable to recover with cues for breathing techniques, increased to 6L with SpO2 improving to 89% at end of session with RN aware, HR up to 132bpm with activity      Pertinent Vitals/Pain Pain Assessment Pain Assessment: Faces Faces Pain Scale: Hurts a little bit Pain Location: generalized Pain Descriptors / Indicators: Grimacing, Guarding Pain Intervention(s): Monitored during session, Limited activity within patient's tolerance    Home Living                          Prior Function            PT Goals (current goals can now be found in the care plan section) Acute Rehab PT Goals Patient Stated Goal: Unable to state PT Goal Formulation: Patient unable to participate in goal setting Time For Goal Achievement: 01/05/23 Progress towards PT goals: Progressing toward goals    Frequency    Min 1X/week      PT Plan      Co-evaluation              AM-PAC PT "6 Clicks" Mobility   Outcome Measure  Help needed turning from your back to your side while in a flat bed without using bedrails?: A Lot Help needed moving from lying on your back to sitting on the side of a flat bed without using bedrails?: A Lot Help needed moving to and from a bed to a chair (including a wheelchair)?: A Little Help needed standing up from a chair using your arms (e.g., wheelchair or bedside chair)?: A Lot Help needed to walk in hospital room?: Total Help needed climbing 3-5 steps with a railing? : Total 6 Click Score: 11    End of Session Equipment Utilized During Treatment: Oxygen;Gait belt Activity Tolerance: No increased pain;Patient limited by fatigue Patient left: in bed;with call bell/phone within reach;with bed alarm set Nurse Communication: Mobility status (pt on 6L O2 at end of session) PT Visit  Diagnosis: Unsteadiness on feet (R26.81);Other abnormalities of gait and mobility (R26.89);Muscle weakness (generalized) (M62.81)     Time: 2130-8657 PT Time Calculation (min) (ACUTE ONLY): 22 min  Charges:    $Therapeutic Activity: 8-22 mins PT General Charges $$ ACUTE PT VISIT: 1 Visit                      R. PTA Acute Rehabilitation Services Office: [phone]   Catalina Antigua 12/24/2022, 4:09 PM

## 2022-12-25 ENCOUNTER — Inpatient Hospital Stay (HOSPITAL_COMMUNITY): Payer: Medicare Other

## 2022-12-25 ENCOUNTER — Encounter (HOSPITAL_COMMUNITY): Payer: Self-pay | Admitting: Internal Medicine

## 2022-12-25 ENCOUNTER — Other Ambulatory Visit: Payer: Self-pay

## 2022-12-25 DIAGNOSIS — A419 Sepsis, unspecified organism: Secondary | ICD-10-CM | POA: Diagnosis not present

## 2022-12-25 DIAGNOSIS — J15211 Pneumonia due to Methicillin susceptible Staphylococcus aureus: Secondary | ICD-10-CM | POA: Diagnosis not present

## 2022-12-25 DIAGNOSIS — J9601 Acute respiratory failure with hypoxia: Secondary | ICD-10-CM | POA: Diagnosis not present

## 2022-12-25 DIAGNOSIS — G40901 Epilepsy, unspecified, not intractable, with status epilepticus: Secondary | ICD-10-CM | POA: Diagnosis not present

## 2022-12-25 LAB — CBC
HCT: 39.3 % (ref 36.0–46.0)
Hemoglobin: 13 g/dL (ref 12.0–15.0)
MCH: 34.4 pg — ABNORMAL HIGH (ref 26.0–34.0)
MCHC: 33.1 g/dL (ref 30.0–36.0)
MCV: 104 fL — ABNORMAL HIGH (ref 80.0–100.0)
Platelets: 688 10*3/uL — ABNORMAL HIGH (ref 150–400)
RBC: 3.78 MIL/uL — ABNORMAL LOW (ref 3.87–5.11)
RDW: 15.8 % — ABNORMAL HIGH (ref 11.5–15.5)
WBC: 14.6 10*3/uL — ABNORMAL HIGH (ref 4.0–10.5)
nRBC: 0 % (ref 0.0–0.2)

## 2022-12-25 LAB — COMPREHENSIVE METABOLIC PANEL
ALT: 45 U/L — ABNORMAL HIGH (ref 0–44)
AST: 128 U/L — ABNORMAL HIGH (ref 15–41)
Albumin: 2.3 g/dL — ABNORMAL LOW (ref 3.5–5.0)
Alkaline Phosphatase: 212 U/L — ABNORMAL HIGH (ref 38–126)
Anion gap: 14 (ref 5–15)
BUN: 37 mg/dL — ABNORMAL HIGH (ref 8–23)
CO2: 27 mmol/L (ref 22–32)
Calcium: 8.9 mg/dL (ref 8.9–10.3)
Chloride: 96 mmol/L — ABNORMAL LOW (ref 98–111)
Creatinine, Ser: 1.39 mg/dL — ABNORMAL HIGH (ref 0.44–1.00)
GFR, Estimated: 42 mL/min — ABNORMAL LOW (ref >60)
Glucose, Bld: 175 mg/dL — ABNORMAL HIGH (ref 70–99)
Potassium: 4.3 mmol/L (ref 3.5–5.1)
Sodium: 137 mmol/L (ref 135–145)
Total Bilirubin: 0.3 mg/dL (ref 0.3–1.2)
Total Protein: 7.4 g/dL (ref 6.5–8.1)

## 2022-12-25 LAB — CK: Total CK: 36 U/L — ABNORMAL LOW (ref 38–234)

## 2022-12-25 LAB — MAGNESIUM: Magnesium: 2.7 mg/dL — ABNORMAL HIGH (ref 1.7–2.4)

## 2022-12-25 LAB — PHOSPHORUS: Phosphorus: 3.7 mg/dL (ref 2.5–4.6)

## 2022-12-25 MED ORDER — LEVALBUTEROL HCL 0.63 MG/3ML IN NEBU: 0.6300 mg | INHALATION_SOLUTION | Freq: Four times a day (QID) | RESPIRATORY_TRACT | Status: DC | PRN

## 2022-12-25 MED ORDER — METOPROLOL TARTRATE 12.5 MG HALF TABLET
12.5000 mg | ORAL_TABLET | Freq: Two times a day (BID) | ORAL | Status: DC
  Administered 2022-12-25 – 2022-12-29 (×6): 12.5 mg
  Filled 2022-12-25 (×6): qty 1

## 2022-12-25 MED ORDER — IPRATROPIUM BROMIDE 0.02 % IN SOLN: 0.5000 mg | Freq: Four times a day (QID) | RESPIRATORY_TRACT | Status: DC | PRN

## 2022-12-25 NOTE — Care Management Important Message (Signed)
Important Message  Patient Details  Name: Krystal Peterson MRN: [id number] Date of Birth: 05-08-1956   Medicare Important Message Given:  Yes     Sherilyn Banker 12/25/2022, 1:11 PM

## 2022-12-25 NOTE — Progress Notes (Signed)
Speech Language Pathology Treatment: Dysphagia  Patient Details Name: NIDHI MECCIA MRN: [id number] DOB: 1955-11-16 Today's Date: 12/25/2022 Time: 4782-9562 SLP Time Calculation (min) (ACUTE ONLY): 10 min  Assessment / Plan / Recommendation Clinical Impression  Pt now awake, conversing and eager for po's. Pt able to self feed applesauce and hold cup. She is demonstrating signs of oropharyngeal dysphagia with consistent immediate cough and delayed throat clears with thin liquids. No signs noted with applesauce. Coughing noted prior to po's. She was recently intubated and has a history of lingual cancer s/p radiation in 2015 with subsequent dysphagia per family (noted in HPI). She will need instrumental assessment with MBS which is scheduled today at 12:30.     HPI HPI: 67 year old lady,  had some nausea and vomiting and diarrhea 12/12/22 followng eating bad mushrooms for few days and therefore quit drinking alcohol which she normally does on a regular basis.  Then husband witnessed seizure by the husband with a left gaze.  Tonic-clonic generalized.   Husband did CPR on advise by EMS for 15 min though he felt she did not lose pulse. Had 1 more episode of seizure on arrival via EMS.  In the ER unresponsive and intubated 8/4-8/5 then 8/6-8/12.   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  Recommendations may be updated based on patient status, additional functional criteria and insurance  authorization.    Recommendations  Diet recommendations: NPO Medication Administration: Via alternative means                  Oral care QID   Frequent or constant Supervision/Assistance Dysphagia, unspecified (R13.10)     MBS     Royce Macadamia  12/25/2022, 10:11 AM

## 2022-12-25 NOTE — Progress Notes (Signed)
Occupational Therapy Treatment Patient Details Name: Krystal Peterson MRN: [id number] DOB: 09-Mar-1956 Today's Date: 12/25/2022   History of present illness Pt is 67 year old presented to [facility] on  12/14/22 for with stroke like symptoms after a seizure. Had been off ETOH due to n/v. Pt with severe sepsis with septic shock MSSA PNA. Intubated on admission. Extubated 8/5. Reintubated 8/6. Extubated 8/12.  PMH - CVA, ICD, depression, HTN, etoh use disorder, tongue CA, RA   OT comments  Pt making progress with functional goals. Pt alert and participated dun walking to bathroom and sink for ADL tasks. Pt's O2 SATs 92-94%, HR 113-117, BP 112/84. No c/o pain. OT will continue to follow acutely to maximize level of function and safety      If plan is discharge home, recommend the following:  A lot of help with walking and/or transfers;A lot of help with bathing/dressing/bathroom;Two people to help with bathing/dressing/bathroom;Assistance with cooking/housework;Assistance with feeding;Direct supervision/assist for medications management;Direct supervision/assist for financial management;Assist for transportation;Help with stairs or ramp for entrance;Supervision due to cognitive status   Equipment Recommendations  Other (comment) (defer)    Recommendations for Other Services      Precautions / Restrictions Precautions Precautions: Fall Precaution Comments: cortrak Restrictions Weight Bearing Restrictions: No       Mobility Bed Mobility               General bed mobility comments: pt in recliner upon arrival    Transfers Overall transfer level: Needs assistance Equipment used: Rolling walker (2 wheels), 1 person hand held assist Transfers: Sit to/from Stand Sit to Stand: Mod assist, Min assist           General transfer comment: mod A to stand from recliner, min A to stand from commode and from armchair at sink     Balance Overall balance assessment: Needs  assistance Sitting-balance support: Feet supported Sitting balance-Leahy Scale: Fair     Standing balance support: During functional activity, Single extremity supported Standing balance-Leahy Scale: Poor                             ADL either performed or assessed with clinical judgement   ADL Overall ADL's : Needs assistance/impaired     Grooming: Wash/dry hands;Wash/dry face;Minimal assistance;Standing           Upper Body Dressing : Minimal assistance;Sitting   Lower Body Dressing: Moderate assistance Lower Body Dressing Details (indicate cue type and reason): doning socks Toilet Transfer: Minimal assistance;Ambulation;Rolling walker (2 wheels);Transfer board;Grab bars   Toileting- Clothing Manipulation and Hygiene: Moderate assistance;Sit to/from stand       Functional mobility during ADLs: Moderate assistance;Minimal assistance;Rolling walker (2 wheels);Cueing for safety      Extremity/Trunk Assessment Upper Extremity Assessment Upper Extremity Assessment: Generalized weakness   Lower Extremity Assessment Lower Extremity Assessment: Defer to PT evaluation   Cervical / Trunk Assessment Cervical / Trunk Assessment: Kyphotic    Vision Ability to See in Adequate Light: 0 Adequate Patient Visual Report: No change from baseline     Perception     Praxis      Cognition Arousal: Alert Behavior During Therapy: Flat affect Overall Cognitive Status: No family/caregiver present to determine baseline cognitive functioning  Exercises      Shoulder Instructions       General Comments      Pertinent Vitals/ Pain       Pain Assessment Pain Assessment: No/denies pain Faces Pain Scale: No hurt  Home Living                                          Prior Functioning/Environment              Frequency  Min 1X/week        Progress Toward Goals  OT  Goals(current goals can now be found in the care plan section)  Progress towards OT goals: Progressing toward goals     Plan      Co-evaluation                 AM-PAC OT "6 Clicks" Daily Activity     Outcome Measure   Help from another person eating meals?: None Help from another person taking care of personal grooming?: A Little Help from another person toileting, which includes using toliet, bedpan, or urinal?: A Lot Help from another person bathing (including washing, rinsing, drying)?: A Lot Help from another person to put on and taking off regular upper body clothing?: A Little Help from another person to put on and taking off regular lower body clothing?: A Lot 6 Click Score: 16    End of Session Equipment Utilized During Treatment: Oxygen;Gait belt;Rolling walker (2 wheels)  OT Visit Diagnosis: Unsteadiness on feet (R26.81);Other abnormalities of gait and mobility (R26.89);Muscle weakness (generalized) (M62.81)   Activity Tolerance Patient tolerated treatment well   Patient Left with call bell/phone within reach;in chair;with chair alarm set   Nurse Communication Mobility status        Time: 1610-9604 OT Time Calculation (min): 23 min  Charges: OT General Charges $OT Visit: 1 Visit OT Treatments $Self Care/Home Management : 8-22 mins $Therapeutic Activity: 8-22 mins    Galen Manila 12/25/2022, 11:57 AM

## 2022-12-25 NOTE — Progress Notes (Addendum)
Modified Barium Swallow Study  Patient Details  Name: Krystal Peterson MRN: [id number] Date of Birth: 1955/09/04  Today's Date: 12/25/2022  Modified Barium Swallow completed.  Full report located under Chart Review in the Imaging Section.  History of Present Illness 67 year old lady,  had some nausea and vomiting and diarrhea 12/12/22 followng eating bad mushrooms for few days and therefore quit drinking alcohol which she normally does on a regular basis.  Then husband witnessed seizure by the husband with a left gaze.  Tonic-clonic generalized.   Husband did CPR on advise by EMS for 15 min though he felt she did not lose pulse. Had 1 more episode of seizure on arrival via EMS.  In the ER unresponsive and intubated 8/4-8/5 then 8/6-8/12.   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 On antiplatelet therapy, ongoing smoking, intermittent UTI periodically on Cipro   Clinical Impression Pt demonstrated oral and pharyngoesophageal dysphagia in setting of history of lingual cancer s/p radiation and recent intubation. Decreased oral control with posterior escape with thin, impaired and incomplete mastication with expectoration of partial bolus. Thin and nectar penetrated vocal cords with thin aspirating during subsequent swallows. Chin tuck resulted in greater volume of aspiration. There was no epiglottic deflection with thin, nectar but present with heavier puree bolus, decreased tongue base retraction and pharyngeal stripping resulting in significant vallecular and pyriform sinus residue. Pharyngoesophageal distention was reduced with stasis at the level of the PES. She coughed once throughout the study with decreased sensation. Pt is  deconditioned and became obviously fatigued with difficulty initiating subsequent swallows in attemptst to clear residue. Esophageal scan was unremarkable. Recommend pt continue NPO with continued ST for pharyngeal strengthening and time post extubation and time for overall improved strength. She can have supervised ice chips after oral care. Factors that may increase risk of adverse event in presence of aspiration Krystal Peterson 2021): Frail or deconditioned;Weak cough  Swallow Evaluation Recommendations Recommendations: NPO Medication Administration: Via alternative means Oral care recommendations: Oral care QID (4x/day)      Krystal Peterson 12/25/2022,2:07 PM

## 2022-12-25 NOTE — Progress Notes (Signed)
PROGRESS NOTE  Krystal Peterson:[id number] DOB: Nov 17, 1955   PCP: Pediatrics, [place]  Patient is from: Home.  Lives with husband.  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  She was started on Keppra.  Apparently husband did CPR for 15 minutes per recommendation by EMS though he fell she did not lose pulse.  On arrival to ED, she was unresponsive and intubated and admitted to ICU.  Patient was extubated on 8/5 but remained agitated and reintubated.  Hospital course complicated by septic shock due to MSSA pneumonia, polymorphic VT x 2 on 8/7 aborted by AICD shock and prolonged agitation requiring mechanical ventilation and sedation.  Eventually, she came off vasopressors and sedation.  She was extubated on 8/12 and transferred [facility] on 8/14 on 10 L by HFNC,  tube feed via cortrack, and IV Ancef for MSSA pneumonia..    Oxygen requirement improved.  Remains n.p.o. on TF pending MBS.    Subjective: Seen and examined earlier this morning.  No major events overnight of this morning.  No complaints.  Denies pain, shortness of breath, GI or UTI symptoms.  She is awake and alert and oriented x 4 except date and day.  Morning labs pending.  Objective: Vitals:   12/25/22 0737 12/25/22 0841 12/25/22 0843 12/25/22 1111  BP: 96/71   112/84  Pulse: (!) 106   (!) 113  Resp:    (!) 25  Temp: 97.7 F (36.5 C)   99.6 F (37.6 C)  TempSrc: Oral   Oral  SpO2: 94% (!) 88% 93% 99%  Weight:      Height:         Examination:  GENERAL: Appears frail.  No apparent distress. HEENT: MMM.  Vision and hearing grossly intact.  Cortrack in place NECK: Supple.  No apparent JVD.  RESP:  No IWOB.  Fair aeration bilaterally. CVS:  RRR. Heart sounds normal.  ABD/GI/GU: BS+. Abd soft, NTND.  MSK/EXT:  Moves extremities. No apparent deformity. No edema.  SKIN: no apparent skin lesion or wound NEURO: Awake and alert.  Oriented x 4 except date and day.  No apparent focal neuro deficit. PSYCH: Calm. Normal affect.     Procedures:  8/4-8/12 intubation and mechanical ventilation  Microbiology summarized: 8/4-MRSA PCR screen negative 8/7 and 8/10-respiratory culture with Staph aureus resistant to erythromycin, clindamycin  Assessment and plan: Principal Problem:   Seizure (HCC) Active Problems:   Protein-calorie malnutrition, severe   Acute respiratory failure with hypoxia (HCC)   Status epilepticus (HCC)   Alcohol withdrawal seizure with complication (HCC)  Acute toxic/septic encephalopathy: In the setting of seizure, possible EtOH withdrawal, ICU delirium and sepsis due to MSSA bacteremia: CT head without acute finding.  Unable to obtain MRI due to AICD incompatibility.  Improved.  Awake and alert and oriented x 4 except date and day.  No focal neurodeficit. -Treat treatable causes -Reorientation and delirium precaution -Minimize or avoid sedating medications -Continue tube feed pending MBS  Severe sepsis with septic shock due to MSSA pneumonia: Not POA.  -Completed antibiotic course with IV Ancef  on 8/15 -Pulmonary toilet  Seizure likely due to alcohol withdrawal: EEG without active seizure but evidence of potential epileptogenicity arising from the right temporal region and moderate diffuse encephalopathy. -Continue Keppra 1 g twice daily per neurology  EtOH withdrawal with agitation and seizure: Currently no withdrawal symptoms. -Completed phenobarbital taper -Continue multivitamin, folic  acid and thiamine    Polymorphic VT likely due to hypokalemia, hypomagnesemia: Aborted by ICD shock while in ICU. Prior history of VF Arrest 2015 s/p ICD placement, prolonged QT syndrome -Continue telemetry monitoring -Avoid or minimize QT prolonging drugs -Optimize electrolytes   Acute hypoxemic/hypercapnic respiratory failure requiring mechanical ventilation in the setting of MSSA pneumonia, sepsis, mucous plugging and seizure:  TTE without significant finding.  Appears euvolemic on exam.  IV Lasix discontinued on 8/14 due to soft blood pressure.  Respiratory failure improving.  Currently on 7 L. -Wean oxygen as able -Continue scheduled and as needed nebulizers -Aspiration precaution, OOB/PT/OT -N.p.o. pending MBS. -Continue Ancef as previously planned   Hypotension/history of hypertension: Normotensive.  Slightly tachycardic.  TSH normal. -Start low-dose metoprolol for tachycardia. -Change albuterol to Xopenex.  Tobacco dependence -Encouraged smoking cessation when able to comprehend -Continue nicotine patch  Physical deconditioning in the setting of acute illness -PT/OT eval   Prior history of stroke: CT head without acute finding.  Not able to obtain MRI due to AICD.  No focal neurodeficit. -Continue Lipitor and aspirin.  Right parietal cyst,  -follows with NSGY  Anxiety and depression: Stable -Continue Lexapro  Hypothyroidism: TSH normal. -Con't synthroid   Previous tongue cancer s/p chemoradiation in 2015 -Outpatient follow-up  Severe protein calorie malnutrition Body mass index is 21.49 kg/m. 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  None at bedside today. Level of care: Telemetry Medical Status is: Inpatient Remains inpatient appropriate because: Respiratory failure with significant oxygen requirement, dysphagia, encephalopathy and seizure   Final disposition: TBD Consultants:  Pulmonology admitted patient Neurology  55 minutes with more than 50% spent in reviewing records, counseling patient/family and coordinating care.   Sch Meds:  Scheduled Meds:  arformoterol  15 mcg Nebulization BID   aspirin  81 mg Per Tube Daily   atorvastatin  40 mg Per Tube QPM   bethanechol  10 mg Per Tube TID   budesonide (PULMICORT) nebulizer solution  0.5 mg Nebulization BID   Chlorhexidine Gluconate Cloth  6 each Topical Daily   docusate  100 mg Per Tube QHS   enoxaparin (LOVENOX) injection  40 mg Subcutaneous Q24H   escitalopram  20 mg Per Tube Daily   feeding supplement (OSMOLITE 1.5 CAL)  1,000 mL Per Tube Q24H   feeding supplement (PROSource TF20)  60 mL Per Tube Daily   folic acid  1 mg Per Tube Daily   guaiFENesin  15 mL Per Tube Q6H   levETIRAcetam  1,000 mg Per Tube BID   levothyroxine  100 mcg Per Tube Q0600   lidocaine  2 patch Transdermal Q24H   multivitamin with minerals  1 tablet Per Tube Daily   nicotine  14 mg Transdermal Daily   polyethylene glycol  17 g Per Tube QHS   revefenacin  175 mcg Nebulization Daily   sodium chloride flush  10-40 mL  Intracatheter Q12H   thiamine  100 mg Per Tube Daily   Continuous Infusions:  sodium chloride Stopped (12/15/22 0859)    ceFAZolin (ANCEF) IV 2 g (12/25/22 1026)   PRN Meds:.acetaminophen, albuterol, bisacodyl, mouth rinse, sodium chloride flush  Antimicrobials: Anti-infectives (From admission, onward)    Start     Dose/Rate Route Frequency Ordered Stop   12/20/22 1600  ceFAZolin (ANCEF) IVPB 2g/100 mL premix        2 g 200 mL/hr over 30 Minutes Intravenous Every 8 hours 12/20/22 1517 12/25/22 2359   12/19/22 1700  ceFAZolin  (ANCEF) IVPB 2g/100 mL premix  Status:  Discontinued        2 g 200 mL/hr over 30 Minutes Intravenous Every 12 hours 12/19/22 1342 12/20/22 1517   12/17/22 0600  piperacillin-tazobactam (ZOSYN) IVPB 3.375 g  Status:  Discontinued        3.375 g 12.5 mL/hr over 240 Minutes Intravenous Every 8 hours 12/17/22 0449 12/19/22 1342        I have personally reviewed the following labs and images: CBC: Recent Labs  Lab 12/20/22 0038 12/20/22 1407 12/21/22 0852 12/21/22 1117 12/22/22 0443 12/23/22 0358 12/24/22 1256  WBC 16.1*  --  16.4*  --  20.1* 16.0* 16.3*  HGB 9.8*   < > 10.9* 13.9 11.2* 11.6* 12.9  HCT 29.1*   < > 32.4* 41.0 33.3* 34.5* 39.3  MCV 104.7*  --  103.8*  --  102.1* 101.5* 103.4*  PLT 225  --  292  --  366 384 598*   < > = values in this interval not displayed.   BMP &GFR Recent Labs  Lab 12/19/22 0458 12/20/22 0038 12/20/22 1407 12/20/22 2030 12/21/22 0444 12/21/22 1117 12/22/22 0443 12/23/22 0358 12/24/22 1256  NA 140 140   < >  --  143 143 140 140 140  139  K 4.1 3.6   < >  --  3.4* 3.3* 4.3 3.3* 4.1  4.0  CL 104 108  --   --  105  --  101 104 93*  93*  CO2 25 23  --   --  26  --  26 26 30  29   GLUCOSE 81 101*  --   --  87  --  90 94 177*  200*  BUN 21 17  --   --  13  --  15 14 34*  34*  CREATININE 1.05* 1.13*  --   --  0.91  --  0.78 0.77 1.22*  1.31*  CALCIUM 8.5* 8.4*  --   --  8.0*  --  8.5* 7.2* 9.3  9.1  MG 2.1 1.9  --   --  1.9  --  2.3  --  2.6*  PHOS 2.1* 1.4*  --  5.0* 3.1  --  4.2 3.3 4.4  4.4   < > = values in this interval not displayed.   Estimated Creatinine Clearance: 35.2 mL/min (A) (by C-G formula based on SCr of 1.22 mg/dL (H)). Liver & Pancreas: Recent Labs  Lab 12/22/22 0443 12/23/22 0358 12/24/22 1256  AST  --   --  115*  ALT  --   --  47*  ALKPHOS  --   --  185*  BILITOT  --   --  0.4  PROT  --   --  7.4  ALBUMIN 1.8* 1.6* 2.2*  2.2*   No results for input(s): "LIPASE", "AMYLASE" in the last 168 hours. No  results for  input(s): "AMMONIA" in the last 168 hours. Diabetic: No results for input(s): "HGBA1C" in the last 72 hours. Recent Labs  Lab 12/23/22 1944 12/23/22 2320 12/24/22 0319 12/24/22 0719 12/24/22 1644  GLUCAP 97 122* 117* 135* 107*   Cardiac Enzymes: Recent Labs  Lab 12/24/22 1315  CKTOTAL 41   No results for input(s): "PROBNP" in the last 8760 hours. Coagulation Profile: No results for input(s): "INR", "PROTIME" in the last 168 hours. Thyroid Function Tests: No results for input(s): "TSH", "T4TOTAL", "FREET4", "T3FREE", "THYROIDAB" in the last 72 hours. Lipid Profile: No results for input(s): "CHOL", "HDL", "LDLCALC", "TRIG", "CHOLHDL", "LDLDIRECT" in the last 72 hours. Anemia Panel: No results for input(s): "VITAMINB12", "FOLATE", "FERRITIN", "TIBC", "IRON", "RETICCTPCT" in the last 72 hours. Urine analysis: No results found for: "COLORURINE", "APPEARANCEUR", "LABSPEC", "PHURINE", "GLUCOSEU", "HGBUR", "BILIRUBINUR", "KETONESUR", "PROTEINUR", "UROBILINOGEN", "NITRITE", "LEUKOCYTESUR" Sepsis Labs: Invalid input(s): "PROCALCITONIN", "LACTICIDVEN"  Microbiology: Recent Results (from the past 240 hour(s))  Culture, Respiratory w Gram Stain     Status: None   Collection Time: 12/17/22  4:52 AM   Specimen: Tracheal Aspirate; Respiratory  Result Value Ref Range Status   Specimen Description TRACHEAL ASPIRATE  Final   Special Requests NONE  Final   Gram Stain   Final    ABUNDANT WBC PRESENT,BOTH PMN AND MONONUCLEAR FEW GRAM POSITIVE COCCI IN CLUSTERS Performed at [facility], [address] [place], [place] [address]    Culture ABUNDANT STAPHYLOCOCCUS AUREUS  Final   Report Status 12/19/2022 FINAL  Final   Organism ID, Bacteria STAPHYLOCOCCUS AUREUS  Final      Susceptibility   Staphylococcus aureus - MIC*    CIPROFLOXACIN <=0.5 SENSITIVE Sensitive     ERYTHROMYCIN RESISTANT Resistant     GENTAMICIN <=0.5 SENSITIVE Sensitive     OXACILLIN <=0.25 SENSITIVE  Sensitive     TETRACYCLINE <=1 SENSITIVE Sensitive     VANCOMYCIN <=0.5 SENSITIVE Sensitive     TRIMETH/SULFA <=10 SENSITIVE Sensitive     CLINDAMYCIN RESISTANT Resistant     RIFAMPIN <=0.5 SENSITIVE Sensitive     Inducible Clindamycin POSITIVE Resistant     LINEZOLID 2 SENSITIVE Sensitive     * ABUNDANT STAPHYLOCOCCUS AUREUS  Culture, Respiratory w Gram Stain     Status: None   Collection Time: 12/20/22 11:37 AM   Specimen: Bronchoalveolar Lavage; Respiratory  Result Value Ref Range Status   Specimen Description BRONCHIAL ALVEOLAR LAVAGE  Final   Special Requests NONE  Final   Gram Stain   Final    RARE SQUAMOUS EPITHELIAL CELLS PRESENT ABUNDANT WBC PRESENT, PREDOMINANTLY PMN RARE GRAM POSITIVE COCCI Performed at [facility], [address], [place], [place] [address]    Culture MODERATE STAPHYLOCOCCUS AUREUS  Final   Report Status 12/22/2022 FINAL  Final   Organism ID, Bacteria STAPHYLOCOCCUS AUREUS  Final      Susceptibility   Staphylococcus aureus - MIC*    CIPROFLOXACIN <=0.5 SENSITIVE Sensitive     ERYTHROMYCIN RESISTANT Resistant     GENTAMICIN <=0.5 SENSITIVE Sensitive     OXACILLIN <=0.25 SENSITIVE Sensitive     TETRACYCLINE <=1 SENSITIVE Sensitive     VANCOMYCIN <=0.5 SENSITIVE Sensitive     TRIMETH/SULFA <=10 SENSITIVE Sensitive     CLINDAMYCIN RESISTANT Resistant     RIFAMPIN <=0.5 SENSITIVE Sensitive     Inducible Clindamycin POSITIVE Resistant     LINEZOLID 2 SENSITIVE Sensitive     * MODERATE STAPHYLOCOCCUS AUREUS    Radiology Studies: No results found.   T.  [facility]  If 7PM-7AM, please contact night-coverage [email or web address] 12/25/2022, 11:24 AM

## 2022-12-25 NOTE — Transportation (Signed)
1235: left the unit for barium swallow.  1320: Back to the unit.

## 2022-12-25 NOTE — Plan of Care (Signed)
  Problem: Education: Goal: Knowledge of General Education information will improve Description: Including pain rating scale, medication(s)/side effects and non-pharmacologic comfort measures Outcome: Progressing   Problem: Health Behavior/Discharge Planning: Goal: Ability to manage health-related needs will improve Outcome: Progressing   Problem: Clinical Measurements: Goal: Ability to maintain clinical measurements within normal limits will improve Outcome: Progressing Goal: Will remain free from infection Outcome: Progressing Goal: Diagnostic test results will improve Outcome: Progressing Goal: Respiratory complications will improve Outcome: Progressing Goal: Cardiovascular complication will be avoided Outcome: Progressing   Problem: Activity: Goal: Risk for activity intolerance will decrease Outcome: Progressing   Problem: Nutrition: Goal: Adequate nutrition will be maintained Outcome: Progressing   Problem: Coping: Goal: Level of anxiety will decrease Outcome: Progressing   Problem: Elimination: Goal: Will not experience complications related to bowel motility Outcome: Progressing Goal: Will not experience complications related to urinary retention Outcome: Progressing   Problem: Pain Managment: Goal: General experience of comfort will improve Outcome: Progressing   Problem: Safety: Goal: Ability to remain free from injury will improve Outcome: Progressing   Problem: Skin Integrity: Goal: Risk for impaired skin integrity will decrease Outcome: Progressing   Problem: Safety: Goal: Non-violent Restraint(s) Outcome: Progressing   Problem: Activity: Goal: Ability to tolerate increased activity will improve Outcome: Progressing   Problem: Respiratory: Goal: Ability to maintain a clear airway and adequate ventilation will improve Outcome: Progressing   Problem: Role Relationship: Goal: Method of communication will improve Outcome: Progressing   

## 2022-12-25 NOTE — Progress Notes (Signed)
Nutrition Follow-up  DOCUMENTATION CODES:  Severe malnutrition in context of chronic illness  INTERVENTION:  Continue cyclic feeds of Osmolite 1.5 @ 83 mL/hr x12 hrs (0600-1800)  60 mL Prosource TF20 daily  This will provide a total of ( ) 1580 kcals, 83 gm protein and 765 mL free water.   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 - remains applicable  GOAL:  Patient will meet greater than or equal to 90% of their needs - goal met via tube feeding  MONITOR:  Vent status, Labs, Weight trends, TF tolerance, I & O's  REASON FOR ASSESSMENT:  Consult Enteral/tube feeding initiation and management  ASSESSMENT:  67 y.o. female presented to the ED with seizures. 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 Pt admitted with seizures.  8/04 - admitted, intubated, TF initiated at 20 ml/hr (was not advanced past trickle rate), D10 gtt added due to hypoglycemia 8/05 - extubated 8/06 - intubated, Cortrak placed (tip gastric)  8/12 - extubated 8/15 - s/p MBS: continue NPO   Pt awake and alert today. Sitting up in wheelchair preparing to be cleaned up by nursing staff at time of visit. Endorses feeling well today. Denies n/v/abdominal pain. Denies hunger over night on daytime cyclic feeds. Will continue with this regimen until pt able to advance diet and then can consider adjusting based on nutritional adequacy.   Fatigue and lethargy had been limiting factor for ability to participate with SLP evaluation.  MBS today with findings of "deconditioning and fatigue with difficulty initiating subsequent swallows to clear residue."  Admission weight history: 8/4: 56 kg 8/15: 53.1 kg  Medications: colace, folvite,  MVI, miralax, thiamine  Labs: BUN 34, Cr 1.22, anion gap 17, Mg 2.6, alkaline phos 185, AST 115, ALT 47, GFR 49, CBG's 107-135 x24 hours  Diet Order:   Diet Order             Diet NPO time specified  Diet effective now                  EDUCATION NEEDS:  Not appropriate for education at this time  Skin:  Skin Assessment: Reviewed RN Assessment (abrasion to left upper flank)  Last BM:  8/12  Height:  Ht Readings from Last 1 Encounters:  12/19/22 5' 1.89" (1.572 m)   Weight:  Wt Readings from Last 1 Encounters:  12/25/22 53.1 kg   Ideal Body Weight:  50 kg  BMI:  Body mass index is 21.49 kg/m.  Estimated Nutritional Needs:  Kcal:  1600-1800 Protein:  80-90 grams Fluid:  1.6-1.8 L  Drusilla Kanner, RDN, LDN Clinical Nutrition

## 2022-12-26 DIAGNOSIS — J9601 Acute respiratory failure with hypoxia: Secondary | ICD-10-CM | POA: Diagnosis not present

## 2022-12-26 DIAGNOSIS — A419 Sepsis, unspecified organism: Secondary | ICD-10-CM | POA: Diagnosis not present

## 2022-12-26 DIAGNOSIS — J15211 Pneumonia due to Methicillin susceptible Staphylococcus aureus: Secondary | ICD-10-CM | POA: Diagnosis not present

## 2022-12-26 DIAGNOSIS — G40901 Epilepsy, unspecified, not intractable, with status epilepticus: Secondary | ICD-10-CM | POA: Diagnosis not present

## 2022-12-26 LAB — HEPATIC FUNCTION PANEL
ALT: 24 U/L (ref 0–44)
AST: 66 U/L — ABNORMAL HIGH (ref 15–41)
Albumin: 2.4 g/dL — ABNORMAL LOW (ref 3.5–5.0)
Alkaline Phosphatase: 160 U/L — ABNORMAL HIGH (ref 38–126)
Bilirubin, Direct: <0.1 mg/dL (ref 0.0–0.2)
Total Bilirubin: 0.3 mg/dL (ref 0.3–1.2)
Total Protein: 6.4 g/dL — ABNORMAL LOW (ref 6.5–8.1)

## 2022-12-26 LAB — GLUCOSE, CAPILLARY: Glucose-Capillary: 85 mg/dL (ref 70–99)

## 2022-12-26 LAB — CBC
HCT: 32.4 % — ABNORMAL LOW (ref 36.0–46.0)
Hemoglobin: 10.4 g/dL — ABNORMAL LOW (ref 12.0–15.0)
MCH: 33.2 pg (ref 26.0–34.0)
MCHC: 32.1 g/dL (ref 30.0–36.0)
MCV: 103.5 fL — ABNORMAL HIGH (ref 80.0–100.0)
Platelets: 588 10*3/uL — ABNORMAL HIGH (ref 150–400)
RBC: 3.13 MIL/uL — ABNORMAL LOW (ref 3.87–5.11)
RDW: 15.6 % — ABNORMAL HIGH (ref 11.5–15.5)
WBC: 11.1 10*3/uL — ABNORMAL HIGH (ref 4.0–10.5)
nRBC: 0 % (ref 0.0–0.2)

## 2022-12-26 LAB — BASIC METABOLIC PANEL
Anion gap: 10 (ref 5–15)
BUN: 28 mg/dL — ABNORMAL HIGH (ref 8–23)
CO2: 27 mmol/L (ref 22–32)
Calcium: 8.3 mg/dL — ABNORMAL LOW (ref 8.9–10.3)
Chloride: 101 mmol/L (ref 98–111)
Creatinine, Ser: 1 mg/dL (ref 0.44–1.00)
GFR, Estimated: >60 mL/min (ref >60)
Glucose, Bld: 96 mg/dL (ref 70–99)
Potassium: 3.7 mmol/L (ref 3.5–5.1)
Sodium: 138 mmol/L (ref 135–145)

## 2022-12-26 LAB — CORTISOL: Cortisol, Plasma: 13.9 ug/dL

## 2022-12-26 MED ORDER — ALBUMIN HUMAN 25 % IV SOLN
25.0000 g | INTRAVENOUS | Status: AC
  Administered 2022-12-26: 25 g via INTRAVENOUS
  Filled 2022-12-26: qty 100

## 2022-12-26 MED ORDER — MIDODRINE HCL 5 MG PO TABS
10.0000 mg | ORAL_TABLET | Freq: Three times a day (TID) | ORAL | Status: DC
  Administered 2022-12-26 – 2022-12-28 (×8): 10 mg
  Filled 2022-12-26 (×8): qty 2

## 2022-12-26 MED ORDER — SODIUM CHLORIDE 0.9 % BOLUS PEDS
1000.0000 mL | INTRAVENOUS | Status: AC
  Administered 2022-12-26: 1000 mL via INTRAVENOUS

## 2022-12-26 NOTE — Progress Notes (Signed)
Physical Therapy Treatment Patient Details Name: Krystal Peterson MRN: [id number] DOB: 1955/11/18 Today's Date: 12/26/2022   History of Present Illness Pt is 67 year old presented to [facility] on  12/14/22 for with stroke like symptoms after a seizure. Had been off ETOH due to n/v. Pt with severe sepsis with septic shock MSSA PNA. Intubated on admission. Extubated 8/5. Reintubated 8/6. Extubated 8/12.  PMH - CVA, ICD, depression, HTN, etoh use disorder, tongue CA, RA    PT Comments  Showing some notable progress since last PT visit. Goals updated to further progress safety and independence.  Requires up to min assist with transfer and gait. Demonstrates poor anticipatory skills, drifting Lt and Rt while ambulating, reduced awareness, and neglect towards objects/hazards in room and hallway, often requiring min assist and max VC to avoid (otherwise drifts into items that in plain view.) Practiced scanning and identifying items in hallway. No overt buckling but occasional LOB with RW needing some assist to correct. Reported dizziness when she transferred to bed earlier with staff but denies during PT visit today (see orthostatics below.)     12/26/22 1500  Orthostatic Lying   BP- Lying 106/80  Pulse- Lying 98  Orthostatic Sitting  BP- Sitting 105/82  Pulse- Sitting 98  Orthostatic Standing at 0 minutes  BP- Standing at 0 minutes 103/86  Pulse- Standing at 0 minutes 101      If plan is discharge home, recommend the following: Assist for transportation;Direct supervision/assist for medications management;Direct supervision/assist for financial management;Assistance with cooking/housework;A little help with walking and/or transfers;A little help with bathing/dressing/bathroom;Help with stairs or ramp for entrance   Can travel by private vehicle     Yes  Equipment Recommendations  Other (comment) (To be determined)    Recommendations for Other Services       Precautions / Restrictions  Precautions Precautions: Fall Precaution Comments: cortrak Restrictions Weight Bearing Restrictions: No     Mobility  Bed Mobility Overal bed mobility: Needs Assistance Bed Mobility: Sit to Supine, Supine to Sit     Supine to sit: Contact guard Sit to supine: Contact guard assist   General bed mobility comments: CGA with VC for technique to rise to EOB, somewhat effortful with raising up, but got back into bed with minimal effort.    Transfers Overall transfer level: Needs assistance Equipment used: Rolling walker (2 wheels) Transfers: Sit to/from Stand Sit to Stand: Min assist           General transfer comment: Min assist for boost and balance. Slight posterior lean, a bit guarded but stable once upright with RW for support. Denied dizziness.    Ambulation/Gait Ambulation/Gait assistance: Min assist Gait Distance (Feet): 75 Feet Assistive device: Rolling walker (2 wheels) Gait Pattern/deviations: Step-through pattern, Decreased stride length, Festinating, Shuffle, Drifts right/left Gait velocity: decr Gait velocity interpretation: <1.31 ft/sec, indicative of household ambulator   General Gait Details: Required up to min assist for walker control and frequent VC for scanning and awareness of obstacle navigation. Demonstrates neglect on Lt and right at times with drifting in bil directions unless corrected. Practiced scanning and identifying signs while walking, this was effortful for pt.   Stairs             Wheelchair Mobility     Tilt Bed    Modified Rankin (Stroke Patients Only)       Balance Overall balance assessment: Needs assistance Sitting-balance support: Feet supported, No upper extremity supported Sitting balance-Leahy Scale: Fair  Standing balance support: During functional activity, Single extremity supported Standing balance-Leahy Scale: Poor Standing balance comment: Min A                            Cognition  Arousal: Alert Behavior During Therapy: WFL for tasks assessed/performed Overall Cognitive Status: No family/caregiver present to determine baseline cognitive functioning                                 General Comments: Some difficulty with recall. Requires VC for scanning, reduced awareness of deficits        Exercises General Exercises - Lower Extremity Ankle Circles/Pumps: AROM, Both, 10 reps, Seated Long Arc Quad: 10 reps, Seated, Strengthening, Both    General Comments General comments (skin integrity, edema, etc.): SpO2 95% on RA; See above for orthostatics      Pertinent Vitals/Pain Pain Assessment Pain Assessment: No/denies pain Pain Intervention(s): Monitored during session    Home Living                          Prior Function            PT Goals (current goals can now be found in the care plan section) Acute Rehab PT Goals Patient Stated Goal: Feel better PT Goal Formulation: Patient unable to participate in goal setting Time For Goal Achievement: 01/05/23 Potential to Achieve Goals: Good Progress towards PT goals: Progressing toward goals    Frequency    Min 1X/week      PT Plan      Co-evaluation              AM-PAC PT "6 Clicks" Mobility   Outcome Measure  Help needed turning from your back to your side while in a flat bed without using bedrails?: A Little Help needed moving from lying on your back to sitting on the side of a flat bed without using bedrails?: A Little Help needed moving to and from a bed to a chair (including a wheelchair)?: A Little Help needed standing up from a chair using your arms (e.g., wheelchair or bedside chair)?: A Little Help needed to walk in hospital room?: A Little Help needed climbing 3-5 steps with a railing? : Total 6 Click Score: 16    End of Session Equipment Utilized During Treatment: Gait belt Activity Tolerance: Patient tolerated treatment well;No increased pain Patient  left: in bed;with call bell/phone within reach;with bed alarm set Nurse Communication: Mobility status (VSS) PT Visit Diagnosis: Unsteadiness on feet (R26.81);Other abnormalities of gait and mobility (R26.89);Muscle weakness (generalized) (M62.81)     Time: 4132-4401 PT Time Calculation (min) (ACUTE ONLY): 20 min  Charges:    $Gait Training: 8-22 mins PT General Charges $$ ACUTE PT VISIT: 1 Visit                     Kathlyn Sacramento, PT, DPT [facility]  Rehabilitation Services Physical Therapist Office: [phone] Website:  [email or web address]    Berton Mount 12/26/2022, 3:32 PM

## 2022-12-26 NOTE — Plan of Care (Signed)
  Problem: Education: Goal: Knowledge of General Education information will improve Description: Including pain rating scale, medication(s)/side effects and non-pharmacologic comfort measures Outcome: Progressing   Problem: Health Behavior/Discharge Planning: Goal: Ability to manage health-related needs will improve Outcome: Progressing   Problem: Clinical Measurements: Goal: Ability to maintain clinical measurements within normal limits will improve Outcome: Progressing Goal: Will remain free from infection Outcome: Progressing Goal: Diagnostic test results will improve Outcome: Progressing Goal: Respiratory complications will improve Outcome: Progressing Goal: Cardiovascular complication will be avoided Outcome: Progressing   Problem: Activity: Goal: Risk for activity intolerance will decrease Outcome: Progressing   Problem: Nutrition: Goal: Adequate nutrition will be maintained Outcome: Progressing   Problem: Coping: Goal: Level of anxiety will decrease Outcome: Progressing   Problem: Elimination: Goal: Will not experience complications related to bowel motility Outcome: Progressing Goal: Will not experience complications related to urinary retention Outcome: Progressing   Problem: Pain Managment: Goal: General experience of comfort will improve Outcome: Progressing   Problem: Safety: Goal: Ability to remain free from injury will improve Outcome: Progressing   Problem: Skin Integrity: Goal: Risk for impaired skin integrity will decrease Outcome: Progressing   Problem: Safety: Goal: Non-violent Restraint(s) Outcome: Progressing   Problem: Activity: Goal: Ability to tolerate increased activity will improve Outcome: Progressing   Problem: Respiratory: Goal: Ability to maintain a clear airway and adequate ventilation will improve Outcome: Progressing   Problem: Role Relationship: Goal: Method of communication will improve Outcome: Progressing   

## 2022-12-26 NOTE — Plan of Care (Signed)
  Problem: Safety: Goal: Non-violent Restraint(s) Outcome: Not Progressing   Problem: Skin Integrity: Goal: Risk for impaired skin integrity will decrease Outcome: Not Progressing   Problem: Safety: Goal: Ability to remain free from injury will improve Outcome: Not Progressing   Problem: Nutrition: Goal: Adequate nutrition will be maintained Outcome: Not Progressing

## 2022-12-26 NOTE — Progress Notes (Signed)
Pt was able to self administer flutter valve today with no complications. Flutter at bedside within reach.

## 2022-12-26 NOTE — Progress Notes (Signed)
Speech Language Pathology Treatment: Dysphagia  Patient Details Name: Krystal Peterson MRN: [id number] DOB: 1956-02-15 Today's Date: 12/26/2022 Time: 4098-1191 SLP Time Calculation (min) (ACUTE ONLY): 15 min  Assessment / Plan / Recommendation Clinical Impression  Pt on room air, able to reposition herself upright; initially quite dysarthric, but immediately improved with oral care and ice. Pt able to self feed ice, which elicits secretion mobilization, pt hocking up thick white and sometimes bloody deep secretions while taking ice. Seems very helpful in clearing pharynx and improving pulmonary hygiene. Pt does not have teeth and risk of severe oral bacteria accumulating is low. Oral care not needed every 30 min or before every few ice chips, only several times a day, with a tooth brush at bedside. Pt able to take ice independently to improve swallowing mechanism over the next 24-48 hours. Will check in for repeat MBS in the short term.   HPI HPI: 67 year old lady,  had some nausea and vomiting and diarrhea 12/12/22 followng eating bad mushrooms for few days and therefore quit drinking alcohol which she normally does on a regular basis.  Then husband witnessed seizure by the husband with a left gaze.  Tonic-clonic generalized.   Husband did CPR on advise by EMS for 15 min though he felt she did not lose pulse. Had 1 more episode of seizure on arrival via EMS.  In the ER unresponsive and intubated 8/4-8/5 then 8/6-8/12.   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  Recommendations may be updated based on patient status, additional functional criteria and insurance authorization.    Recommendations  Diet recommendations: NPO;Other(comment) (ice) Medication Administration: Via alternative means                              Continue with current plan of care     Hasten Sweitzer, [name]  12/26/2022, 10:17 AM

## 2022-12-26 NOTE — Progress Notes (Signed)
PROGRESS NOTE  Krystal Peterson BJY:[id number] DOB: [birth year]   PCP: [facility], [place]  Patient is from: Home.  Lives with husband.  DOA: 12/13/2022 LOS: 12  Chief complaints Chief Complaint  Patient presents with   Code Stroke     Brief Narrative / Interim history: 67 year old F with PMH of V-fib arrest in 2015 s/p ICD, prolonged QT, cryptogenic PCA CVA, SCC of tongue s/p radiation in 2015, prior alcohol use, rheumatoid arthritis, HTN, HLD, hypothyroidism, depression and tobacco use disorder presenting with nausea, vomiting, diarrhea, left gaze and tonic-clonic seizure, and admitted to ICU for seizure.  She was started on Keppra.  Apparently husband did CPR for 15 minutes per recommendation by EMS though he fell she did not lose pulse.  On arrival to ED, she was unresponsive and intubated and admitted to ICU.  Patient was extubated on 8/5 but remained agitated and reintubated.  Hospital course complicated by septic shock due to MSSA pneumonia, polymorphic VT x 2 on 8/7 aborted by AICD shock and prolonged agitation requiring mechanical ventilation and sedation.  Eventually, she came off vasopressors and sedation.  She was extubated on 8/12 and transferred [facility] on 8/14 on 10 L by HFNC,  tube feed via cortrack, and IV Ancef for MSSA pneumonia.Marland Kitchen    Respiratory failure resolved.  Remains n.p.o. on TF per recommendation by SLP.    Subjective: Seen and examined earlier this morning.  Episode of hypotension overnight and early this morning.  Patient was not symptomatic.  No complaint this morning.  Objective: Vitals:   12/26/22 0718 12/26/22 0743 12/26/22 1144 12/26/22 1147  BP: (!) 79/61  107/79 (!) 74/46  Pulse: 87  92 92  Resp: 20     Temp: 98.1 F (36.7 C)  98 F (36.7 C)   TempSrc: Oral  Oral   SpO2: 96% 99% 92% 95%  Weight:      Height:        Examination:  GENERAL: Appears frail.  No apparent distress. HEENT: MMM.  Vision and hearing  grossly intact.  NECK: Supple.  No apparent JVD.  RESP:  No IWOB.  Fair aeration bilaterally. CVS:  RRR. Heart sounds normal.  ABD/GI/GU: BS+. Abd soft, NTND.  MSK/EXT:   No apparent deformity. Moves extremities. No edema.  SKIN: no apparent skin lesion or wound NEURO: Awake and alert. Oriented fairly.  No apparent focal neuro deficit. PSYCH: Calm. Normal affect.     Procedures:  8/4-8/12 intubation and mechanical ventilation  Microbiology summarized: 8/4-MRSA PCR screen negative 8/7 and 8/10-respiratory culture with Staph aureus resistant to erythromycin, clindamycin  Assessment and plan: Principal Problem:   Seizure (HCC) Active Problems:   Protein-calorie malnutrition, severe   Acute respiratory failure with hypoxia (HCC)   Status epilepticus (HCC)   Alcohol withdrawal seizure with complication (HCC)  Acute toxic/septic encephalopathy: In the setting of seizure, possible EtOH withdrawal, ICU delirium and sepsis due to MSSA bacteremia: CT head without acute finding.  Unable to obtain MRI due to AICD incompatibility.  Improved.  Awake and alert and oriented x 4 except date and day.  No focal neurodeficit. -Treat treatable causes -Reorientation and delirium precaution -Minimize or avoid sedating medications  Severe sepsis with septic shock due to MSSA pneumonia: Not POA.  -Completed antibiotic course with IV Ancef on 8/15 -Pulmonary toilet  Seizure likely due to alcohol withdrawal: EEG without active seizure but evidence of potential epileptogenicity arising from the right temporal region and moderate diffuse encephalopathy. -Continue Keppra 1  g twice daily per neurology  EtOH withdrawal with agitation and seizure: Currently no withdrawal symptoms. -Completed phenobarbital taper -Continue multivitamin, folic acid and thiamine  Polymorphic VT likely due to hypokalemia, hypomagnesemia: Aborted by ICD shock while in ICU. Prior history of VF Arrest 2015 s/p ICD placement,  prolonged QT syndrome -Continue telemetry monitoring -Avoid or minimize QT prolonging drugs -Optimize electrolytes   Acute hypoxemic/hypercapnic respiratory failure requiring mechanical ventilation in the setting of MSSA pneumonia, sepsis, mucous plugging and seizure:  TTE without significant finding.  Appears euvolemic on exam.  IV Lasix discontinued on 8/14 due to soft blood pressure.  Respiratory failure resolved. -Aspiration precaution, incentive telemetry, OOB/PT/OT -Continue scheduled and as needed nebulizers -Continue n.p.o. per SLP. -Continue tube feed.   Hypotension/history of hypertension: Intermittent hypotension last night and this morning.  Slightly tachycardic.  TSH and cortisol normal.  Recent TTE without significant finding. -Start midodrine -Continue low-dose metoprolol with parameters for tachycardia and history of VT.  Dysphagia: Remains n.p.o. on tube feed. -Continue tube feed via cortrak -Continue SLP evaluation  Tobacco dependence -Encouraged smoking cessation when able to comprehend -Continue nicotine patch  Physical deconditioning in the setting of acute illness -OOB and PT/OT   Prior history of stroke: CT head without acute finding.  Not able to obtain MRI due to AICD.  No focal neurodeficit. -Continue Lipitor and aspirin.  Right parietal cyst,  -follows with NSGY  Anxiety and depression: Stable -Continue Lexapro  Hypothyroidism: TSH normal. -Continue Synthroid.   Previous tongue cancer s/p chemoradiation in 2015 -Outpatient follow-up  Severe protein calorie malnutrition Body mass index is 21.57 kg/m. 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 (Active)  12/25/22 2123  Location: Coccyx  Location Orientation: Mid  Staging: Stage 2 -  Partial thickness loss of dermis presenting as a shallow open injury with a red, pink wound bed without slough.  Wound Description (Comments):   Present on Admission: No  Dressing Type Foam - Lift dressing to assess site every shift 12/26/22 0326   DVT prophylaxis:  enoxaparin (LOVENOX) injection 40 mg Start: 12/17/22 0915  Code Status: DNR/DNI Family Communication: Updated patient's husband at bedside this afternoon. Level of care: Telemetry Medical Status is: Inpatient Remains inpatient appropriate because: Respiratory failure with significant oxygen requirement, dysphagia, encephalopathy and seizure   Final disposition: TBD Consultants:  Pulmonology admitted patient Neurology  55 minutes with more than 50% spent in reviewing records, counseling patient/family and coordinating care.   Sch Meds:  Scheduled Meds:  arformoterol  15 mcg Nebulization BID   aspirin  81 mg Per Tube Daily   atorvastatin  40 mg Per Tube QPM   budesonide (PULMICORT) nebulizer solution  0.5 mg Nebulization BID   Chlorhexidine Gluconate Cloth  6 each Topical Daily   docusate  100 mg Per Tube QHS   enoxaparin (LOVENOX) injection  40 mg Subcutaneous Q24H   escitalopram  20 mg Per Tube Daily   feeding supplement (OSMOLITE 1.5 CAL)  1,000 mL Per Tube Q24H   feeding supplement (PROSource TF20)  60 mL Per Tube Daily   folic acid  1 mg Per Tube Daily   guaiFENesin  15 mL Per Tube Q6H   levETIRAcetam  1,000 mg  Per Tube BID   levothyroxine  100 mcg Per Tube Q0600   lidocaine  2 patch Transdermal Q24H   metoprolol tartrate  12.5 mg Per Tube BID   midodrine  10 mg Per Tube TID WC   multivitamin with minerals  1 tablet Per Tube Daily   nicotine  14 mg Transdermal Daily   polyethylene glycol  17 g Per Tube QHS   revefenacin  175 mcg Nebulization Daily   sodium chloride flush  10-40  mL Intracatheter Q12H   thiamine  100 mg Per Tube Daily   Continuous Infusions:  sodium chloride Stopped (12/15/22 0859)   PRN Meds:.acetaminophen, bisacodyl, levalbuterol **AND** ipratropium, mouth rinse, sodium chloride flush  Antimicrobials: Anti-infectives (From admission, onward)    Start     Dose/Rate Route Frequency Ordered Stop   12/20/22 1600  ceFAZolin (ANCEF) IVPB 2g/100 mL premix        2 g 200 mL/hr over 30 Minutes Intravenous Every 8 hours 12/20/22 1517 12/25/22 2341   12/19/22 1700  ceFAZolin (ANCEF) IVPB 2g/100 mL premix  Status:  Discontinued        2 g 200 mL/hr over 30 Minutes Intravenous Every 12 hours 12/19/22 1342 12/20/22 1517   12/17/22 0600  piperacillin-tazobactam (ZOSYN) IVPB 3.375 g  Status:  Discontinued        3.375 g 12.5 mL/hr over 240 Minutes Intravenous Every 8 hours 12/17/22 0449 12/19/22 1342        I have personally reviewed the following labs and images: CBC: Recent Labs  Lab 12/22/22 0443 12/23/22 0358 12/24/22 1256 12/25/22 1115 12/26/22 0142  WBC 20.1* 16.0* 16.3* 14.6* 11.1*  HGB 11.2* 11.6* 12.9 13.0 10.4*  HCT 33.3* 34.5* 39.3 39.3 32.4*  MCV 102.1* 101.5* 103.4* 104.0* 103.5*  PLT 366 384 598* 688* 588*   BMP &GFR Recent Labs  Lab 12/20/22 0038 12/20/22 1407 12/21/22 0444 12/21/22 1117 12/22/22 0443 12/23/22 0358 12/24/22 1256 12/25/22 1115 12/26/22 0142  NA 140   < > 143   < > 140 140 140  139 137 138  K 3.6   < > 3.4*   < > 4.3 3.3* 4.1  4.0 4.3 3.7  CL 108  --  105  --  101 104 93*  93* 96* 101  CO2 23  --  26  --  26 26 30  29 27 27   GLUCOSE 101*  --  87  --  90 94 177*  200* 175* 96  BUN 17  --  13  --  15 14 34*  34* 37* 28*  CREATININE 1.13*  --  0.91  --  0.78 0.77 1.22*  1.31* 1.39* 1.00  CALCIUM 8.4*  --  8.0*  --  8.5* 7.2* 9.3  9.1 8.9 8.3*  MG 1.9  --  1.9  --  2.3  --  2.6* 2.7*  --   PHOS 1.4*   < > 3.1  --  4.2 3.3 4.4  4.4 3.7  --    < > = values in this interval not displayed.    Estimated Creatinine Clearance: 42.9 mL/min (by C-G formula based on SCr of 1 mg/dL). Liver & Pancreas: Recent Labs  Lab 12/22/22 0443 12/23/22 0358 12/24/22 1256 12/25/22 1115 12/26/22 0142  AST  --   --  115* 128* 66*  ALT  --   --  47* 45* 24  ALKPHOS  --   --  185* 212* 160*  BILITOT  --   --  0.4 0.3 0.3  PROT  --   --  7.4 7.4 6.4*  ALBUMIN 1.8* 1.6* 2.2*  2.2* 2.3* 2.4*   No results for input(s): "LIPASE", "AMYLASE" in the last 168 hours. No results for input(s): "AMMONIA" in the last 168 hours. Diabetic: No results for input(s): "HGBA1C" in the last 72 hours. Recent Labs  Lab 12/23/22 2320 12/24/22 0319 12/24/22 0719 12/24/22 1644 12/26/22 0604  GLUCAP 122* 117* 135* 107* 85   Cardiac Enzymes: Recent Labs  Lab 12/24/22 1315 12/25/22 1115  CKTOTAL 41 36*   No results for input(s): "PROBNP" in the last 8760 hours. Coagulation Profile: No results for input(s): "INR", "PROTIME" in the last 168 hours. Thyroid Function Tests: No results for input(s): "TSH", "T4TOTAL", "FREET4", "T3FREE", "THYROIDAB" in the last 72 hours. Lipid Profile: No results for input(s): "CHOL", "HDL", "LDLCALC", "TRIG", "CHOLHDL", "LDLDIRECT" in the last 72 hours. Anemia Panel: No results for input(s): "VITAMINB12", "FOLATE", "FERRITIN", "TIBC", "IRON", "RETICCTPCT" in the last 72 hours. Urine analysis: No results found for: "COLORURINE", "APPEARANCEUR", "LABSPEC", "PHURINE", "GLUCOSEU", "HGBUR", "BILIRUBINUR", "KETONESUR", "PROTEINUR", "UROBILINOGEN", "NITRITE", "LEUKOCYTESUR" Sepsis Labs: Invalid input(s): "PROCALCITONIN", "LACTICIDVEN"  Microbiology: Recent Results (from the past 240 hour(s))  Culture, Respiratory w Gram Stain     Status: None   Collection Time: 12/17/22  4:52 AM   Specimen: Tracheal Aspirate; Respiratory  Result Value Ref Range Status   Specimen Description TRACHEAL ASPIRATE  Final   Special Requests NONE  Final   Gram Stain   Final    ABUNDANT WBC  PRESENT,BOTH PMN AND MONONUCLEAR FEW GRAM POSITIVE COCCI IN CLUSTERS Performed at [facility], [address] [place], [place] [address]    Culture ABUNDANT STAPHYLOCOCCUS AUREUS  Final   Report Status 12/19/2022 FINAL  Final   Organism ID, Bacteria STAPHYLOCOCCUS AUREUS  Final      Susceptibility   Staphylococcus aureus - MIC*    CIPROFLOXACIN <=0.5 SENSITIVE Sensitive     ERYTHROMYCIN RESISTANT Resistant     GENTAMICIN <=0.5 SENSITIVE Sensitive     OXACILLIN <=0.25 SENSITIVE Sensitive     TETRACYCLINE <=1 SENSITIVE Sensitive     VANCOMYCIN <=0.5 SENSITIVE Sensitive     TRIMETH/SULFA <=10 SENSITIVE Sensitive     CLINDAMYCIN RESISTANT Resistant     RIFAMPIN <=0.5 SENSITIVE Sensitive     Inducible Clindamycin POSITIVE Resistant     LINEZOLID 2 SENSITIVE Sensitive     * ABUNDANT STAPHYLOCOCCUS AUREUS  Culture, Respiratory w Gram Stain     Status: None   Collection Time: 12/20/22 11:37 AM   Specimen: Bronchoalveolar Lavage; Respiratory  Result Value Ref Range Status   Specimen Description BRONCHIAL ALVEOLAR LAVAGE  Final   Special Requests NONE  Final   Gram Stain   Final    RARE SQUAMOUS EPITHELIAL CELLS PRESENT ABUNDANT WBC PRESENT, PREDOMINANTLY PMN RARE GRAM POSITIVE COCCI Performed at [facility], [address], [place], [place] [address]    Culture MODERATE STAPHYLOCOCCUS AUREUS  Final   Report Status 12/22/2022 FINAL  Final   Organism ID, Bacteria STAPHYLOCOCCUS AUREUS  Final      Susceptibility   Staphylococcus aureus - MIC*    CIPROFLOXACIN <=0.5 SENSITIVE Sensitive     ERYTHROMYCIN RESISTANT Resistant     GENTAMICIN <=0.5 SENSITIVE Sensitive     OXACILLIN <=0.25 SENSITIVE Sensitive     TETRACYCLINE <=1 SENSITIVE Sensitive     VANCOMYCIN <=0.5 SENSITIVE Sensitive     TRIMETH/SULFA <=10 SENSITIVE Sensitive     CLINDAMYCIN RESISTANT Resistant  RIFAMPIN <=0.5 SENSITIVE Sensitive     Inducible Clindamycin POSITIVE Resistant     LINEZOLID 2  SENSITIVE Sensitive     * MODERATE STAPHYLOCOCCUS AUREUS    Radiology Studies: No results found.    Terek Bee T. Aarianna Hoadley [facility]  If 7PM-7AM, please contact night-coverage [email or web address] 12/26/2022, 2:08 PM

## 2022-12-26 NOTE — Progress Notes (Addendum)
  Bedside patient nurse reported that patient found to be hypotensive blood pressure dropped to 89/64 after receiving Lopressor 12.5 mg around 11:27 PM.  Per chart review patient has been admitted for severe sepsis in the setting of MSSA pneumonia and acute toxic/metabolic encephalopathy in the setting of alcohol withdrawal.  She has 1 episodes of polymorphic VT due to hypokalemia and hypomagnesemia which was aborted by ICD shock in the ICU.  At the baseline patient is hypotensive however patient has history of hypertension in the past.  Due to persistent sinus tachycardia Lopressor 12.5 mg twice daily has been initiated during this hospital admission. -Checking stat CBC, BMP and lactic acid. - Given patient has soft blood pressure tonight giving 1 L of bolus of NS and CMP showed low albumin 2.3 so giving patient albumin 25 g once to help with improvement of blood pressure. -Given patient has polymorphic VT followed by continuous sinus tachycardia it is not medically reasonable to hold the Lopressor for now.  Continue to monitor the blood pressure if it drops again in that case will stop the Lopressor permanently.  Tereasa Coop, MD [facility] 12/26/2022, 1:28 AM

## 2022-12-27 DIAGNOSIS — J15211 Pneumonia due to Methicillin susceptible Staphylococcus aureus: Secondary | ICD-10-CM | POA: Diagnosis not present

## 2022-12-27 DIAGNOSIS — A419 Sepsis, unspecified organism: Secondary | ICD-10-CM | POA: Diagnosis not present

## 2022-12-27 DIAGNOSIS — G40901 Epilepsy, unspecified, not intractable, with status epilepticus: Secondary | ICD-10-CM | POA: Diagnosis not present

## 2022-12-27 DIAGNOSIS — J9601 Acute respiratory failure with hypoxia: Secondary | ICD-10-CM | POA: Diagnosis not present

## 2022-12-27 LAB — GLUCOSE, CAPILLARY
Glucose-Capillary: 117 mg/dL — ABNORMAL HIGH (ref 70–99)
Glucose-Capillary: 85 mg/dL (ref 70–99)

## 2022-12-27 LAB — RENAL FUNCTION PANEL
Albumin: 2.5 g/dL — ABNORMAL LOW (ref 3.5–5.0)
Anion gap: 10 (ref 5–15)
BUN: 22 mg/dL (ref 8–23)
CO2: 23 mmol/L (ref 22–32)
Calcium: 8.7 mg/dL — ABNORMAL LOW (ref 8.9–10.3)
Chloride: 101 mmol/L (ref 98–111)
Creatinine, Ser: 0.94 mg/dL (ref 0.44–1.00)
GFR, Estimated: >60 mL/min (ref >60)
Glucose, Bld: 88 mg/dL (ref 70–99)
Phosphorus: 2.9 mg/dL (ref 2.5–4.6)
Potassium: 3.8 mmol/L (ref 3.5–5.1)
Sodium: 134 mmol/L — ABNORMAL LOW (ref 135–145)

## 2022-12-27 LAB — CBC
HCT: 35.4 % — ABNORMAL LOW (ref 36.0–46.0)
Hemoglobin: 11.6 g/dL — ABNORMAL LOW (ref 12.0–15.0)
MCH: 33.7 pg (ref 26.0–34.0)
MCHC: 32.8 g/dL (ref 30.0–36.0)
MCV: 102.9 fL — ABNORMAL HIGH (ref 80.0–100.0)
Platelets: 712 10*3/uL — ABNORMAL HIGH (ref 150–400)
RBC: 3.44 MIL/uL — ABNORMAL LOW (ref 3.87–5.11)
RDW: 15.3 % (ref 11.5–15.5)
WBC: 12.8 10*3/uL — ABNORMAL HIGH (ref 4.0–10.5)
nRBC: 0 % (ref 0.0–0.2)

## 2022-12-27 LAB — MAGNESIUM: Magnesium: 2.2 mg/dL (ref 1.7–2.4)

## 2022-12-27 NOTE — Plan of Care (Signed)
  Problem: Education: Goal: Knowledge of General Education information will improve Description: Including pain rating scale, medication(s)/side effects and non-pharmacologic comfort measures Outcome: Progressing   Problem: Health Behavior/Discharge Planning: Goal: Ability to manage health-related needs will improve Outcome: Progressing   Problem: Clinical Measurements: Goal: Ability to maintain clinical measurements within normal limits will improve Outcome: Progressing Goal: Will remain free from infection Outcome: Progressing Goal: Diagnostic test results will improve Outcome: Progressing Goal: Respiratory complications will improve Outcome: Progressing Goal: Cardiovascular complication will be avoided Outcome: Progressing   Problem: Activity: Goal: Risk for activity intolerance will decrease Outcome: Progressing   Problem: Nutrition: Goal: Adequate nutrition will be maintained Outcome: Progressing   Problem: Coping: Goal: Level of anxiety will decrease Outcome: Progressing   Problem: Elimination: Goal: Will not experience complications related to bowel motility Outcome: Progressing Goal: Will not experience complications related to urinary retention Outcome: Progressing   Problem: Pain Managment: Goal: General experience of comfort will improve Outcome: Progressing   Problem: Safety: Goal: Ability to remain free from injury will improve Outcome: Progressing   Problem: Skin Integrity: Goal: Risk for impaired skin integrity will decrease Outcome: Progressing   Problem: Safety: Goal: Non-violent Restraint(s) Outcome: Progressing   Problem: Activity: Goal: Ability to tolerate increased activity will improve Outcome: Progressing   Problem: Respiratory: Goal: Ability to maintain a clear airway and adequate ventilation will improve Outcome: Progressing   Problem: Role Relationship: Goal: Method of communication will improve Outcome: Progressing   

## 2022-12-27 NOTE — Progress Notes (Signed)
PROGRESS NOTE  Krystal Peterson ZOX:[id number] DOB: 1955-11-08   PCP: [facility], [place]  Patient is from: Home.  Lives with husband.  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  She was started on Keppra.  Apparently husband did CPR for 15 minutes per recommendation by EMS though he fell she did not lose pulse.  On arrival to ED, she was unresponsive and intubated and admitted to ICU.  Patient was extubated on 8/5 but remained agitated and reintubated.  Hospital course complicated by septic shock due to MSSA pneumonia, polymorphic VT x 2 on 8/7 aborted by AICD shock and prolonged agitation requiring mechanical ventilation and sedation.  Eventually, she came off vasopressors and sedation.  She was extubated on 8/12 and transferred to [facility] on 8/14 on 10 L by HFNC,  tube feed via cortrack, and IV Ancef for MSSA pneumonia.Marland Kitchen    Respiratory failure resolved.  Remains n.p.o. on TF per recommendation by SLP.    Subjective: Seen and examined earlier this morning.  No major events overnight of this morning.  No complaints.  Using her breathing treatments/nebulizer.  Objective: Vitals:   12/27/22 0746 12/27/22 0814 12/27/22 0848 12/27/22 1138  BP: (!) 85/57 (S) 103/75  103/80  Pulse: 85   98  Resp: 20   20  Temp: (!) 97.5 F (36.4 C)   97.6 F (36.4 C)  TempSrc: Oral   Oral  SpO2: 99%  95% 99%  Weight:      Height:        Examination:  GENERAL: Appears frail.  No apparent distress. HEENT: MMM.  Vision and hearing grossly  intact.  NECK: Supple.  No apparent JVD.  RESP:  No IWOB.  Fair aeration bilaterally. CVS:  RRR. Heart sounds normal.  ABD/GI/GU: BS+. Abd soft, NTND.  MSK/EXT:   No apparent deformity. Moves extremities. No edema.  SKIN: no apparent skin lesion or wound NEURO: Awake and alert. Oriented fairly.  No apparent focal neuro deficit. PSYCH: Calm. Normal affect.     Procedures:  8/4-8/12 intubation and mechanical ventilation  Microbiology summarized: 8/4-MRSA PCR screen negative 8/7 and 8/10-respiratory culture with Staph aureus resistant to erythromycin, clindamycin  Assessment and plan: Principal Problem:   Seizure (HCC) Active Problems:   Protein-calorie malnutrition, severe   Acute respiratory failure with hypoxia (HCC)   Status epilepticus (HCC)   Alcohol withdrawal seizure with complication (HCC)  Acute toxic/septic encephalopathy: In the setting of seizure, possible EtOH withdrawal, ICU delirium and sepsis due to MSSA bacteremia: CT head without acute finding.  Unable to obtain MRI due to AICD incompatibility.  Improved.  Awake and alert and oriented x 4 except date and day.  No focal neurodeficit. -Treat treatable causes -Reorientation and delirium precaution -Minimize or avoid sedating medications  Severe sepsis with septic shock due to MSSA pneumonia: Not POA.  -Completed antibiotic course with IV Ancef on 8/15 -Pulmonary toilet  Seizure likely due to alcohol withdrawal: EEG without active seizure but evidence of potential epileptogenicity arising from the right temporal region and moderate diffuse encephalopathy. -Continue Keppra 1 g twice  daily per neurology  EtOH withdrawal with agitation and seizure: Currently no withdrawal symptoms. Completed phenobarbital taper -Continue multivitamin, folic acid and thiamine  Polymorphic VT likely due to hypokalemia, hypomagnesemia: Aborted by ICD shock while in ICU. Prior history of VF Arrest 2015 s/p ICD placement, prolonged QT  syndrome -Continue telemetry monitoring -Avoid or minimize QT prolonging drugs -Optimize electrolytes   Acute hypoxemic/hypercapnic respiratory failure requiring mechanical ventilation in the setting of MSSA pneumonia, sepsis, mucous plugging and seizure:  TTE without significant finding.  Appears euvolemic on exam.  IV Lasix discontinued on 8/14 due to soft blood pressure.  Respiratory failure resolved. -Aspiration precaution, incentive telemetry, OOB/PT/OT -Continue scheduled and as needed nebulizers -Continue n.p.o. per SLP. -Continue tube feed.   Hypotension/history of hypertension: TSH and cortisol normal.  Recent TTE without significant finding.Hypotension resolved.   -Continue midodrine. -Continue low-dose metoprolol with parameters for tachycardia and history of VT.  Dysphagia: Remains n.p.o. on tube feed. -Continue tube feed via cortrak -Continue SLP evaluation  Tobacco dependence -Encouraged smoking cessation when able to comprehend -Continue nicotine patch  Physical deconditioning in the setting of acute illness -OOB and PT/OT   Prior history of stroke: CT head without acute finding.  Not able to obtain MRI due to AICD.  No focal neurodeficit. -Continue Lipitor and aspirin.  Right parietal cyst,  -follows with NSGY  Anxiety and depression: Stable -Continue Lexapro  Hypothyroidism: TSH normal. -Continue Synthroid.   Previous tongue cancer s/p chemoradiation in 2015 -Outpatient follow-up  Severe protein calorie malnutrition Body mass index is 21.57 kg/m. 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 (Active)  12/25/22 2123   Location: Coccyx  Location Orientation: Mid  Staging: Stage 2 -  Partial thickness loss of dermis presenting as a shallow open injury with a red, pink wound bed without slough.  Wound Description (Comments):   Present on Admission: No  Dressing Type Foam - Lift dressing to assess site every shift 12/27/22 0400   DVT prophylaxis:  enoxaparin (LOVENOX) injection 40 mg Start: 12/17/22 0915  Code Status: DNR/DNI Family Communication: None at bedside. Level of care: Telemetry Medical Status is: Inpatient Remains inpatient appropriate because: Dysphagia   Final disposition: Likely home with home health. Consultants:  Pulmonology admitted patient Neurology  35 minutes with more than 50% spent in reviewing records, counseling patient/family and coordinating care.   Sch Meds:  Scheduled Meds:  arformoterol  15 mcg Nebulization BID   aspirin  81 mg Per Tube Daily   atorvastatin  40 mg Per Tube QPM   budesonide (PULMICORT) nebulizer solution  0.5 mg Nebulization BID   Chlorhexidine Gluconate Cloth  6 each Topical Daily   docusate  100 mg Per Tube QHS   enoxaparin (LOVENOX) injection  40 mg Subcutaneous Q24H   escitalopram  20 mg Per Tube Daily   feeding supplement (OSMOLITE 1.5 CAL)  1,000 mL Per Tube Q24H   feeding supplement (PROSource TF20)  60 mL Per Tube Daily   folic acid  1 mg Per Tube Daily   guaiFENesin  15 mL Per Tube Q6H   levETIRAcetam  1,000 mg Per Tube BID   levothyroxine  100 mcg Per Tube Q0600   lidocaine  2 patch Transdermal  Q24H   metoprolol tartrate  12.5 mg Per Tube BID   midodrine  10 mg Per Tube TID WC   multivitamin with minerals  1 tablet Per Tube Daily   nicotine  14 mg Transdermal Daily   polyethylene glycol  17 g Per Tube QHS   revefenacin  175 mcg Nebulization Daily   sodium chloride flush  10-40 mL Intracatheter Q12H   thiamine  100 mg Per Tube Daily   Continuous Infusions:  sodium chloride Stopped (12/15/22 0859)   PRN Meds:.acetaminophen,  bisacodyl, levalbuterol **AND** ipratropium, mouth rinse, sodium chloride flush  Antimicrobials: Anti-infectives (From admission, onward)    Start     Dose/Rate Route Frequency Ordered Stop   12/20/22 1600  ceFAZolin (ANCEF) IVPB 2g/100 mL premix        2 g 200 mL/hr over 30 Minutes Intravenous Every 8 hours 12/20/22 1517 12/25/22 2341   12/19/22 1700  ceFAZolin (ANCEF) IVPB 2g/100 mL premix  Status:  Discontinued        2 g 200 mL/hr over 30 Minutes Intravenous Every 12 hours 12/19/22 1342 12/20/22 1517   12/17/22 0600  piperacillin-tazobactam (ZOSYN) IVPB 3.375 g  Status:  Discontinued        3.375 g 12.5 mL/hr over 240 Minutes Intravenous Every 8 hours 12/17/22 0449 12/19/22 1342        I have personally reviewed the following labs and images: CBC: Recent Labs  Lab 12/23/22 0358 12/24/22 1256 12/25/22 1115 12/26/22 0142 12/27/22 0427  WBC 16.0* 16.3* 14.6* 11.1* 12.8*  HGB 11.6* 12.9 13.0 10.4* 11.6*  HCT 34.5* 39.3 39.3 32.4* 35.4*  MCV 101.5* 103.4* 104.0* 103.5* 102.9*  PLT 384 598* 688* 588* 712*   BMP &GFR Recent Labs  Lab 12/21/22 0444 12/21/22 1117 12/22/22 0443 12/23/22 0358 12/24/22 1256 12/25/22 1115 12/26/22 0142 12/27/22 0427  NA 143   < > 140 140 140  139 137 138 134*  K 3.4*   < > 4.3 3.3* 4.1  4.0 4.3 3.7 3.8  CL 105  --  101 104 93*  93* 96* 101 101  CO2 26  --  26 26 30  29 27 27 23   GLUCOSE 87  --  90 94 177*  200* 175* 96 88  BUN 13  --  15 14 34*  34* 37* 28* 22  CREATININE 0.91  --  0.78 0.77 1.22*  1.31* 1.39* 1.00 0.94  CALCIUM 8.0*  --  8.5* 7.2* 9.3  9.1 8.9 8.3* 8.7*  MG 1.9  --  2.3  --  2.6* 2.7*  --  2.2  PHOS 3.1  --  4.2 3.3 4.4  4.4 3.7  --  2.9   < > = values in this interval not displayed.   Estimated Creatinine Clearance: 45.7 mL/min (by C-G formula based on SCr of 0.94 mg/dL). Liver & Pancreas: Recent Labs  Lab 12/23/22 0358 12/24/22 1256 12/25/22 1115 12/26/22 0142 12/27/22 0427  AST  --  115* 128* 66*   --   ALT  --  47* 45* 24  --   ALKPHOS  --  185* 212* 160*  --   BILITOT  --  0.4 0.3 0.3  --   PROT  --  7.4 7.4 6.4*  --   ALBUMIN 1.6* 2.2*  2.2* 2.3* 2.4* 2.5*   No results for input(s): "LIPASE", "AMYLASE" in the last 168 hours. No results for input(s): "AMMONIA" in the last 168 hours. Diabetic: No results for input(s): "HGBA1C"  in the last 72 hours. Recent Labs  Lab 12/24/22 0319 12/24/22 0719 12/24/22 1644 12/26/22 0604 12/27/22 1330  GLUCAP 117* 135* 107* 85 117*   Cardiac Enzymes: Recent Labs  Lab 12/24/22 1315 12/25/22 1115  CKTOTAL 41 36*   No results for input(s): "PROBNP" in the last 8760 hours. Coagulation Profile: No results for input(s): "INR", "PROTIME" in the last 168 hours. Thyroid Function Tests: No results for input(s): "TSH", "T4TOTAL", "FREET4", "T3FREE", "THYROIDAB" in the last 72 hours. Lipid Profile: No results for input(s): "CHOL", "HDL", "LDLCALC", "TRIG", "CHOLHDL", "LDLDIRECT" in the last 72 hours. Anemia Panel: No results for input(s): "VITAMINB12", "FOLATE", "FERRITIN", "TIBC", "IRON", "RETICCTPCT" in the last 72 hours. Urine analysis: No results found for: "COLORURINE", "APPEARANCEUR", "LABSPEC", "PHURINE", "GLUCOSEU", "HGBUR", "BILIRUBINUR", "KETONESUR", "PROTEINUR", "UROBILINOGEN", "NITRITE", "LEUKOCYTESUR" Sepsis Labs: Invalid input(s): "PROCALCITONIN", "LACTICIDVEN"  Microbiology: Recent Results (from the past 240 hour(s))  Culture, Respiratory w Gram Stain     Status: None   Collection Time: 12/20/22 11:37 AM   Specimen: Bronchoalveolar Lavage; Respiratory  Result Value Ref Range Status   Specimen Description BRONCHIAL ALVEOLAR LAVAGE  Final   Special Requests NONE  Final   Gram Stain   Final    RARE SQUAMOUS EPITHELIAL CELLS PRESENT ABUNDANT WBC PRESENT, PREDOMINANTLY PMN RARE GRAM POSITIVE COCCI Performed at [facility], [address] [place], [place] [address]    Culture MODERATE STAPHYLOCOCCUS AUREUS  Final    Report Status 12/22/2022 FINAL  Final   Organism ID, Bacteria STAPHYLOCOCCUS AUREUS  Final      Susceptibility   Staphylococcus aureus - MIC*    CIPROFLOXACIN <=0.5 SENSITIVE Sensitive     ERYTHROMYCIN RESISTANT Resistant     GENTAMICIN <=0.5 SENSITIVE Sensitive     OXACILLIN <=0.25 SENSITIVE Sensitive     TETRACYCLINE <=1 SENSITIVE Sensitive     VANCOMYCIN <=0.5 SENSITIVE Sensitive     TRIMETH/SULFA <=10 SENSITIVE Sensitive     CLINDAMYCIN RESISTANT Resistant     RIFAMPIN <=0.5 SENSITIVE Sensitive     Inducible Clindamycin POSITIVE Resistant     LINEZOLID 2 SENSITIVE Sensitive     * MODERATE STAPHYLOCOCCUS AUREUS    Radiology Studies: No results found.    Yarenis Cerino T. Reino Lybbert [facility]  If 7PM-7AM, please contact night-coverage [email or web address] 12/27/2022, 3:34 PM

## 2022-12-27 NOTE — Plan of Care (Signed)
  Problem: Education: Goal: Knowledge of General Education information will improve Description Including pain rating scale, medication(s)/side effects and non-pharmacologic comfort measures Outcome: Progressing   

## 2022-12-28 DIAGNOSIS — G40901 Epilepsy, unspecified, not intractable, with status epilepticus: Secondary | ICD-10-CM | POA: Diagnosis not present

## 2022-12-28 DIAGNOSIS — A419 Sepsis, unspecified organism: Secondary | ICD-10-CM | POA: Diagnosis not present

## 2022-12-28 DIAGNOSIS — J15211 Pneumonia due to Methicillin susceptible Staphylococcus aureus: Secondary | ICD-10-CM | POA: Diagnosis not present

## 2022-12-28 DIAGNOSIS — J9601 Acute respiratory failure with hypoxia: Secondary | ICD-10-CM | POA: Diagnosis not present

## 2022-12-28 LAB — CBC
HCT: 36.5 % (ref 36.0–46.0)
Hemoglobin: 12 g/dL (ref 12.0–15.0)
MCH: 33.6 pg (ref 26.0–34.0)
MCHC: 32.9 g/dL (ref 30.0–36.0)
MCV: 102.2 fL — ABNORMAL HIGH (ref 80.0–100.0)
Platelets: 854 10*3/uL — ABNORMAL HIGH (ref 150–400)
RBC: 3.57 MIL/uL — ABNORMAL LOW (ref 3.87–5.11)
RDW: 14.9 % (ref 11.5–15.5)
WBC: 13.1 10*3/uL — ABNORMAL HIGH (ref 4.0–10.5)
nRBC: 0 % (ref 0.0–0.2)

## 2022-12-28 LAB — PHOSPHORUS: Phosphorus: 2.8 mg/dL (ref 2.5–4.6)

## 2022-12-28 LAB — GLUCOSE, CAPILLARY
Glucose-Capillary: 113 mg/dL — ABNORMAL HIGH (ref 70–99)
Glucose-Capillary: 114 mg/dL — ABNORMAL HIGH (ref 70–99)
Glucose-Capillary: 83 mg/dL (ref 70–99)
Glucose-Capillary: 90 mg/dL (ref 70–99)
Glucose-Capillary: 91 mg/dL (ref 70–99)

## 2022-12-28 LAB — COMPREHENSIVE METABOLIC PANEL
ALT: 25 U/L (ref 0–44)
AST: 39 U/L (ref 15–41)
Albumin: 2.6 g/dL — ABNORMAL LOW (ref 3.5–5.0)
Alkaline Phosphatase: 182 U/L — ABNORMAL HIGH (ref 38–126)
Anion gap: 12 (ref 5–15)
BUN: 18 mg/dL (ref 8–23)
CO2: 21 mmol/L — ABNORMAL LOW (ref 22–32)
Calcium: 8.8 mg/dL — ABNORMAL LOW (ref 8.9–10.3)
Chloride: 100 mmol/L (ref 98–111)
Creatinine, Ser: 0.68 mg/dL (ref 0.44–1.00)
GFR, Estimated: >60 mL/min (ref >60)
Glucose, Bld: 84 mg/dL (ref 70–99)
Potassium: 4.2 mmol/L (ref 3.5–5.1)
Sodium: 133 mmol/L — ABNORMAL LOW (ref 135–145)
Total Bilirubin: 0.7 mg/dL (ref 0.3–1.2)
Total Protein: 7 g/dL (ref 6.5–8.1)

## 2022-12-28 LAB — MAGNESIUM: Magnesium: 2.1 mg/dL (ref 1.7–2.4)

## 2022-12-28 MED ORDER — MIDODRINE HCL 5 MG PO TABS
5.0000 mg | ORAL_TABLET | Freq: Three times a day (TID) | ORAL | Status: DC
  Administered 2022-12-29 (×2): 5 mg
  Filled 2022-12-28 (×2): qty 1

## 2022-12-28 NOTE — Plan of Care (Signed)
  Problem: Education: Goal: Knowledge of General Education information will improve Description: Including pain rating scale, medication(s)/side effects and non-pharmacologic comfort measures Outcome: Progressing   Problem: Health Behavior/Discharge Planning: Goal: Ability to manage health-related needs will improve Outcome: Progressing   Problem: Clinical Measurements: Goal: Ability to maintain clinical measurements within normal limits will improve Outcome: Progressing Goal: Will remain free from infection Outcome: Progressing Goal: Diagnostic test results will improve Outcome: Progressing Goal: Respiratory complications will improve Outcome: Progressing Goal: Cardiovascular complication will be avoided Outcome: Progressing   Problem: Activity: Goal: Risk for activity intolerance will decrease Outcome: Progressing   Problem: Nutrition: Goal: Adequate nutrition will be maintained Outcome: Progressing   Problem: Coping: Goal: Level of anxiety will decrease Outcome: Progressing   Problem: Elimination: Goal: Will not experience complications related to bowel motility Outcome: Progressing Goal: Will not experience complications related to urinary retention Outcome: Progressing   Problem: Pain Managment: Goal: General experience of comfort will improve Outcome: Progressing   Problem: Safety: Goal: Ability to remain free from injury will improve Outcome: Progressing   Problem: Skin Integrity: Goal: Risk for impaired skin integrity will decrease Outcome: Progressing   Problem: Safety: Goal: Non-violent Restraint(s) Outcome: Progressing   Problem: Activity: Goal: Ability to tolerate increased activity will improve Outcome: Progressing   Problem: Respiratory: Goal: Ability to maintain a clear airway and adequate ventilation will improve Outcome: Progressing   Problem: Role Relationship: Goal: Method of communication will improve Outcome: Progressing   

## 2022-12-28 NOTE — Progress Notes (Signed)
PROGRESS NOTE  Krystal Peterson ZOX:[id number] DOB: October 16, 1955   PCP: Pediatrics, [place]  Patient is from: Home.  Lives with husband.  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  She was started on Keppra.  Apparently husband did CPR for 15 minutes per recommendation by EMS though he fell she did not lose pulse.  On arrival to ED, she was unresponsive and intubated and admitted to ICU.  Patient was extubated on 8/5 but remained agitated and reintubated.  Hospital course complicated by septic shock due to MSSA pneumonia, polymorphic VT x 2 on 8/7 aborted by AICD shock and prolonged agitation requiring mechanical ventilation and sedation.  Eventually, she came off vasopressors and sedation.  She was extubated on 8/12 and transferred to [facility] on 8/14 on 10 L by HFNC,  tube feed via cortrack, and IV Ancef for MSSA pneumonia.Marland Kitchen    Respiratory failure resolved.  Remains n.p.o. on TF per recommendation by SLP.    Subjective: Seen and examined earlier this morning.  No major events overnight of this morning.  Sleeping but wakes to voice.  No complaints. Objective: Vitals:   12/28/22 0721 12/28/22 0827 12/28/22 0828 12/28/22 1116  BP: 110/72   108/81  Pulse: 99   97  Resp: 20   20  Temp: 97.8 F (36.6 C)   (!) 97.3 F (36.3 C)  TempSrc: Oral   Oral  SpO2: 96% 97% 97% 99%  Weight:      Height:        Examination:  GENERAL: Appears frail.  No apparent distress. HEENT: MMM.  Vision and hearing grossly intact.  NECK: Supple.   No apparent JVD.  RESP:  No IWOB.  Fair aeration bilaterally. CVS:  RRR. Heart sounds normal.  ABD/GI/GU: BS+. Abd soft, NTND.  MSK/EXT:   No apparent deformity. Moves extremities. No edema.  SKIN: no apparent skin lesion or wound NEURO: Sleepy but wakes to voice.  Oriented fairly.  No apparent focal neuro deficit. PSYCH: Calm. Normal affect.     Procedures:  8/4-8/12 intubation and mechanical ventilation  Microbiology summarized: 8/4-MRSA PCR screen negative 8/7 and 8/10-respiratory culture with Staph aureus resistant to erythromycin, clindamycin  Assessment and plan: Principal Problem:   Seizure (HCC) Active Problems:   Protein-calorie malnutrition, severe   Acute respiratory failure with hypoxia (HCC)   Status epilepticus (HCC)   Alcohol withdrawal seizure with complication (HCC)  Acute toxic/septic encephalopathy: In the setting of seizure, possible EtOH withdrawal, ICU delirium and sepsis due to MSSA bacteremia: CT head without acute finding.  Unable to obtain MRI due to AICD incompatibility.  Improved.  Awake and alert and oriented x 4 except date and day.  No focal neurodeficit. -Treat treatable causes -Reorientation and delirium precaution -Minimize or avoid sedating medications  Severe sepsis with septic shock due to MSSA pneumonia: Not POA.  -Completed antibiotic course with IV Ancef on 8/15 -Pulmonary toilet  Seizure likely due to alcohol withdrawal: EEG without active seizure but evidence of potential epileptogenicity arising from the right temporal region and moderate diffuse encephalopathy. -Continue Keppra 1 g  twice daily per neurology  EtOH withdrawal with agitation and seizure: Currently no withdrawal symptoms. Completed phenobarbital taper -Continue multivitamin, folic acid and thiamine  Polymorphic VT likely due to hypokalemia, hypomagnesemia: Aborted by ICD shock while in ICU. Prior history of VF Arrest 2015 s/p ICD placement, prolonged QT  syndrome -Continue telemetry monitoring -Avoid or minimize QT prolonging drugs -Optimize electrolytes   Acute hypoxemic/hypercapnic respiratory failure requiring mechanical ventilation in the setting of MSSA pneumonia, sepsis, mucous plugging and seizure:  TTE without significant finding.  Appears euvolemic on exam.  IV Lasix discontinued on 8/14 due to soft blood pressure.  Respiratory failure resolved. -Aspiration precaution, incentive telemetry, OOB/PT/OT -Continue scheduled and as needed nebulizers -Continue n.p.o. per SLP. -Continue tube feed.   Hypotension/history of hypertension: TSH and cortisol normal.  Recent TTE without significant finding.Hypotension resolved.   -Continue midodrine. -Continue low-dose metoprolol with parameters for tachycardia and history of VT.  Dysphagia: Remains n.p.o. on tube feed. -Continue tube feed via cortrak -Continue SLP evaluation  Tobacco dependence -Encouraged smoking cessation when able to comprehend -Continue nicotine patch  Physical deconditioning in the setting of acute illness -OOB and PT/OT   Prior history of stroke: CT head without acute finding.  Not able to obtain MRI due to AICD.  No focal neurodeficit. -Continue Lipitor and aspirin.  Right parietal cyst,  -follows with NSGY  Anxiety and depression: Stable -Continue Lexapro  Hypothyroidism: TSH normal. -Continue Synthroid.   Previous tongue cancer s/p chemoradiation in 2015 -Outpatient follow-up  Thrombocytosis/mild leukocytosis: Unclear etiology of this.  She has completed antibiotic course for MSSA pneumonia.  She has no fever, cardiopulmonary, GI or UTI symptoms.  Hemodynamically stable. -Continue monitoring  Severe protein calorie malnutrition Body mass index is 21.57 kg/m. 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 (Active)  12/25/22 2123  Location: Coccyx  Location Orientation: Mid  Staging: Stage 2 -  Partial thickness loss of dermis presenting as a shallow open injury with a red, pink wound bed without slough.  Wound Description (Comments):   Present on Admission: No  Dressing Type Foam - Lift dressing to assess site every shift 12/28/22 0800   DVT prophylaxis:  enoxaparin (LOVENOX) injection 40 mg Start: 12/17/22 0915  Code Status: DNR/DNI Family Communication: None at bedside. Level of care: Telemetry Medical Status is: Inpatient Remains inpatient appropriate because: Dysphagia   Final disposition: Likely home with home health. Consultants:  Pulmonology admitted patient Neurology  35 minutes with more than 50% spent in reviewing records, counseling patient/family and coordinating care.   Sch Meds:  Scheduled Meds:  arformoterol  15 mcg Nebulization BID   aspirin  81 mg Per Tube Daily   atorvastatin  40 mg Per Tube QPM   budesonide (PULMICORT) nebulizer solution  0.5 mg Nebulization BID   Chlorhexidine Gluconate Cloth  6 each Topical Daily   docusate  100 mg Per Tube QHS   enoxaparin (LOVENOX) injection  40 mg Subcutaneous Q24H   escitalopram  20 mg Per Tube Daily   feeding supplement (OSMOLITE 1.5 CAL)  1,000 mL Per Tube Q24H   feeding supplement (PROSource TF20)  60 mL Per Tube Daily   folic acid  1 mg Per  Tube Daily   guaiFENesin  15 mL Per Tube Q6H   levETIRAcetam  1,000 mg Per Tube BID   levothyroxine  100 mcg Per Tube Q0600   lidocaine  2 patch Transdermal Q24H   metoprolol tartrate  12.5 mg Per Tube BID   midodrine  10 mg Per Tube TID WC   multivitamin with minerals  1 tablet Per Tube Daily   nicotine  14 mg Transdermal Daily   polyethylene  glycol  17 g Per Tube QHS   revefenacin  175 mcg Nebulization Daily   sodium chloride flush  10-40 mL Intracatheter Q12H   thiamine  100 mg Per Tube Daily   Continuous Infusions:  sodium chloride Stopped (12/15/22 0859)   PRN Meds:.acetaminophen, bisacodyl, levalbuterol **AND** ipratropium, mouth rinse, sodium chloride flush  Antimicrobials: Anti-infectives (From admission, onward)    Start     Dose/Rate Route Frequency Ordered Stop   12/20/22 1600  ceFAZolin (ANCEF) IVPB 2g/100 mL premix        2 g 200 mL/hr over 30 Minutes Intravenous Every 8 hours 12/20/22 1517 12/25/22 2341   12/19/22 1700  ceFAZolin (ANCEF) IVPB 2g/100 mL premix  Status:  Discontinued        2 g 200 mL/hr over 30 Minutes Intravenous Every 12 hours 12/19/22 1342 12/20/22 1517   12/17/22 0600  piperacillin-tazobactam (ZOSYN) IVPB 3.375 g  Status:  Discontinued        3.375 g 12.5 mL/hr over 240 Minutes Intravenous Every 8 hours 12/17/22 0449 12/19/22 1342        I have personally reviewed the following labs and images: CBC: Recent Labs  Lab 12/24/22 1256 12/25/22 1115 12/26/22 0142 12/27/22 0427 12/28/22 0320  WBC 16.3* 14.6* 11.1* 12.8* 13.1*  HGB 12.9 13.0 10.4* 11.6* 12.0  HCT 39.3 39.3 32.4* 35.4* 36.5  MCV 103.4* 104.0* 103.5* 102.9* 102.2*  PLT 598* 688* 588* 712* 854*   BMP &GFR Recent Labs  Lab 12/22/22 0443 12/23/22 0358 12/24/22 1256 12/25/22 1115 12/26/22 0142 12/27/22 0427 12/28/22 0320  NA 140 140 140  139 137 138 134* 133*  K 4.3 3.3* 4.1  4.0 4.3 3.7 3.8 4.2  CL 101 104 93*  93* 96* 101 101 100  CO2 26 26 30  29 27 27 23  21*  GLUCOSE 90 94 177*  200* 175* 96 88 84  BUN 15 14 34*  34* 37* 28* 22 18  CREATININE 0.78 0.77 1.22*  1.31* 1.39* 1.00 0.94 0.68  CALCIUM 8.5* 7.2* 9.3  9.1 8.9 8.3* 8.7* 8.8*  MG 2.3  --  2.6* 2.7*  --  2.2 2.1  PHOS 4.2 3.3 4.4  4.4 3.7  --  2.9 2.8   Estimated Creatinine Clearance: 53.6 mL/min (by C-G formula based on SCr of 0.68  mg/dL). Liver & Pancreas: Recent Labs  Lab 12/24/22 1256 12/25/22 1115 12/26/22 0142 12/27/22 0427 12/28/22 0320  AST 115* 128* 66*  --  39  ALT 47* 45* 24  --  25  ALKPHOS 185* 212* 160*  --  182*  BILITOT 0.4 0.3 0.3  --  0.7  PROT 7.4 7.4 6.4*  --  7.0  ALBUMIN 2.2*  2.2* 2.3* 2.4* 2.5* 2.6*   No results for input(s): "LIPASE", "AMYLASE" in the last 168 hours. No results for input(s): "AMMONIA" in the last 168 hours. Diabetic: No results for input(s): "HGBA1C" in the last 72 hours. Recent Labs  Lab 12/27/22 1330 12/27/22 1626 12/28/22 0041 12/28/22 0606 12/28/22 1121  GLUCAP 117* 85 83 91 113*   Cardiac Enzymes: Recent Labs  Lab 12/24/22 1315 12/25/22 1115  CKTOTAL 41 36*   No results for input(s): "PROBNP" in the last 8760 hours. Coagulation Profile: No results for input(s): "INR", "PROTIME" in the last 168 hours. Thyroid Function Tests: No results for input(s): "TSH", "T4TOTAL", "FREET4", "T3FREE", "THYROIDAB" in the last 72 hours. Lipid Profile: No results for input(s): "CHOL", "HDL", "LDLCALC", "TRIG", "CHOLHDL", "LDLDIRECT" in the last 72 hours. Anemia Panel: No results for input(s): "VITAMINB12", "FOLATE", "FERRITIN", "TIBC", "IRON", "RETICCTPCT" in the last 72 hours. Urine analysis: No results found for: "COLORURINE", "APPEARANCEUR", "LABSPEC", "PHURINE", "GLUCOSEU", "HGBUR", "BILIRUBINUR", "KETONESUR", "PROTEINUR", "UROBILINOGEN", "NITRITE", "LEUKOCYTESUR" Sepsis Labs: Invalid input(s): "PROCALCITONIN", "LACTICIDVEN"  Microbiology: Recent Results (from the past 240 hour(s))  Culture, Respiratory w Gram Stain     Status: None   Collection Time: 12/20/22 11:37 AM   Specimen: Bronchoalveolar Lavage; Respiratory  Result Value Ref Range Status   Specimen Description BRONCHIAL ALVEOLAR LAVAGE  Final   Special Requests NONE  Final   Gram Stain   Final    RARE SQUAMOUS EPITHELIAL CELLS PRESENT ABUNDANT WBC PRESENT, PREDOMINANTLY PMN RARE GRAM POSITIVE  COCCI Performed at [facility], [address], [place], [place] [address]    Culture MODERATE STAPHYLOCOCCUS AUREUS  Final   Report Status 12/22/2022 FINAL  Final   Organism ID, Bacteria STAPHYLOCOCCUS AUREUS  Final      Susceptibility   Staphylococcus aureus - MIC*    CIPROFLOXACIN <=0.5 SENSITIVE Sensitive     ERYTHROMYCIN RESISTANT Resistant     GENTAMICIN <=0.5 SENSITIVE Sensitive     OXACILLIN <=0.25 SENSITIVE Sensitive     TETRACYCLINE <=1 SENSITIVE Sensitive     VANCOMYCIN <=0.5 SENSITIVE Sensitive     TRIMETH/SULFA <=10 SENSITIVE Sensitive     CLINDAMYCIN RESISTANT Resistant     RIFAMPIN <=0.5 SENSITIVE Sensitive     Inducible Clindamycin POSITIVE Resistant     LINEZOLID 2 SENSITIVE Sensitive     * MODERATE STAPHYLOCOCCUS AUREUS    Radiology Studies: No results found.    Laddie Naeem T. Maccoy Ha[facility]ner [facility]  If 7PM-7AM, please contact night-coverage [email or web address] 12/28/2022, 2:26 PM

## 2022-12-28 NOTE — Plan of Care (Signed)
  Problem: Education: Goal: Knowledge of General Education information will improve Description Including pain rating scale, medication(s)/side effects and non-pharmacologic comfort measures Outcome: Progressing   

## 2022-12-29 ENCOUNTER — Inpatient Hospital Stay (HOSPITAL_COMMUNITY): Payer: Medicare Other

## 2022-12-29 DIAGNOSIS — J9601 Acute respiratory failure with hypoxia: Secondary | ICD-10-CM | POA: Diagnosis not present

## 2022-12-29 DIAGNOSIS — G40901 Epilepsy, unspecified, not intractable, with status epilepticus: Secondary | ICD-10-CM | POA: Diagnosis not present

## 2022-12-29 DIAGNOSIS — J15211 Pneumonia due to Methicillin susceptible Staphylococcus aureus: Secondary | ICD-10-CM | POA: Diagnosis not present

## 2022-12-29 DIAGNOSIS — A419 Sepsis, unspecified organism: Secondary | ICD-10-CM | POA: Diagnosis not present

## 2022-12-29 LAB — CBC WITH DIFFERENTIAL/PLATELET
Abs Immature Granulocytes: 0.07 10*3/uL (ref 0.00–0.07)
Basophils Absolute: 0.1 10*3/uL (ref 0.0–0.1)
Basophils Relative: 1 %
Eosinophils Absolute: 0.4 10*3/uL (ref 0.0–0.5)
Eosinophils Relative: 3 %
HCT: 34.5 % — ABNORMAL LOW (ref 36.0–46.0)
Hemoglobin: 11.5 g/dL — ABNORMAL LOW (ref 12.0–15.0)
Immature Granulocytes: 1 %
Lymphocytes Relative: 12 %
Lymphs Abs: 1.5 10*3/uL (ref 0.7–4.0)
MCH: 33.5 pg (ref 26.0–34.0)
MCHC: 33.3 g/dL (ref 30.0–36.0)
MCV: 100.6 fL — ABNORMAL HIGH (ref 80.0–100.0)
Monocytes Absolute: 0.9 10*3/uL (ref 0.1–1.0)
Monocytes Relative: 7 %
Neutro Abs: 9.3 10*3/uL — ABNORMAL HIGH (ref 1.7–7.7)
Neutrophils Relative %: 76 %
Platelets: 885 10*3/uL — ABNORMAL HIGH (ref 150–400)
RBC: 3.43 MIL/uL — ABNORMAL LOW (ref 3.87–5.11)
RDW: 14.7 % (ref 11.5–15.5)
WBC: 12.2 10*3/uL — ABNORMAL HIGH (ref 4.0–10.5)
nRBC: 0 % (ref 0.0–0.2)

## 2022-12-29 LAB — GLUCOSE, CAPILLARY
Glucose-Capillary: 105 mg/dL — ABNORMAL HIGH (ref 70–99)
Glucose-Capillary: 95 mg/dL (ref 70–99)

## 2022-12-29 MED ORDER — ADULT MULTIVITAMIN W/MINERALS CH
1.0000 | ORAL_TABLET | Freq: Every day | ORAL | Status: DC
  Administered 2022-12-30 – 2023-01-03 (×5): 1 via ORAL
  Filled 2022-12-29 (×5): qty 1

## 2022-12-29 MED ORDER — BANATROL TF EN LIQD
60.0000 mL | Freq: Two times a day (BID) | ENTERAL | Status: DC
  Administered 2022-12-29 – 2023-01-02 (×9): 60 mL
  Filled 2022-12-29 (×9): qty 60

## 2022-12-29 MED ORDER — ASPIRIN 81 MG PO CHEW
81.0000 mg | CHEWABLE_TABLET | Freq: Every day | ORAL | Status: DC
  Administered 2022-12-30 – 2023-01-03 (×5): 81 mg via ORAL
  Filled 2022-12-29 (×5): qty 1

## 2022-12-29 MED ORDER — MIDODRINE HCL 5 MG PO TABS
5.0000 mg | ORAL_TABLET | Freq: Three times a day (TID) | ORAL | Status: DC
  Administered 2022-12-29 – 2023-01-01 (×10): 5 mg via ORAL
  Filled 2022-12-29 (×11): qty 1

## 2022-12-29 MED ORDER — FOLIC ACID 1 MG PO TABS
1.0000 mg | ORAL_TABLET | Freq: Every day | ORAL | Status: DC
  Administered 2022-12-30 – 2023-01-03 (×5): 1 mg via ORAL
  Filled 2022-12-29 (×5): qty 1

## 2022-12-29 MED ORDER — POLYETHYLENE GLYCOL 3350 17 G PO PACK
17.0000 g | PACK | Freq: Every day | ORAL | Status: DC
  Administered 2022-12-29 – 2022-12-30 (×2): 17 g via ORAL
  Filled 2022-12-29 (×3): qty 1

## 2022-12-29 MED ORDER — ACETAMINOPHEN 325 MG PO TABS
650.0000 mg | ORAL_TABLET | Freq: Four times a day (QID) | ORAL | Status: DC | PRN
  Administered 2023-01-02 – 2023-01-03 (×2): 650 mg via ORAL
  Filled 2022-12-29 (×2): qty 2

## 2022-12-29 MED ORDER — DOCUSATE SODIUM 50 MG/5ML PO LIQD
100.0000 mg | Freq: Every day | ORAL | Status: DC
  Administered 2022-12-29 – 2022-12-30 (×2): 100 mg via ORAL
  Filled 2022-12-29 (×4): qty 10

## 2022-12-29 MED ORDER — ESCITALOPRAM OXALATE 10 MG PO TABS
20.0000 mg | ORAL_TABLET | Freq: Every day | ORAL | Status: DC
  Administered 2022-12-30 – 2023-01-03 (×5): 20 mg via ORAL
  Filled 2022-12-29 (×5): qty 2

## 2022-12-29 MED ORDER — OSMOLITE 1.5 CAL PO LIQD
1000.0000 mL | ORAL | Status: DC
  Administered 2022-12-29 – 2023-01-02 (×4): 1000 mL

## 2022-12-29 MED ORDER — GUAIFENESIN 100 MG/5ML PO LIQD
15.0000 mL | Freq: Four times a day (QID) | ORAL | Status: DC
  Administered 2022-12-29 – 2023-01-03 (×18): 15 mL via ORAL
  Filled 2022-12-29 (×18): qty 15

## 2022-12-29 MED ORDER — METOPROLOL TARTRATE 12.5 MG HALF TABLET
12.5000 mg | ORAL_TABLET | Freq: Two times a day (BID) | ORAL | Status: DC
  Administered 2022-12-29 – 2023-01-03 (×10): 12.5 mg via ORAL
  Filled 2022-12-29 (×10): qty 1

## 2022-12-29 MED ORDER — LEVOTHYROXINE SODIUM 100 MCG PO TABS
100.0000 ug | ORAL_TABLET | Freq: Every day | ORAL | Status: DC
  Administered 2022-12-30 – 2023-01-03 (×5): 100 ug via ORAL
  Filled 2022-12-29 (×5): qty 1

## 2022-12-29 MED ORDER — ATORVASTATIN CALCIUM 40 MG PO TABS
40.0000 mg | ORAL_TABLET | Freq: Every evening | ORAL | Status: DC
  Administered 2022-12-29 – 2023-01-02 (×5): 40 mg via ORAL
  Filled 2022-12-29 (×5): qty 1

## 2022-12-29 MED ORDER — LEVETIRACETAM 500 MG PO TABS
1000.0000 mg | ORAL_TABLET | Freq: Two times a day (BID) | ORAL | Status: DC
  Administered 2022-12-29 – 2023-01-03 (×10): 1000 mg via ORAL
  Filled 2022-12-29 (×10): qty 2

## 2022-12-29 NOTE — Progress Notes (Signed)
Modified Barium Swallow Study  Patient Details  Name: Krystal Peterson MRN: [id number] Date of Birth: 09-22-1955  Today's Date: 12/29/2022  Modified Barium Swallow completed.  Full report located under Chart Review in the Imaging Section.  History of Present Illness 67 year old lady,  had some nausea and vomiting and diarrhea 12/12/22 followng eating bad mushrooms for few days and therefore quit drinking alcohol which she normally does on a regular basis.  Then husband witnessed seizure by the husband with a left gaze.  Tonic-clonic generalized.   Husband did CPR on advise by EMS for 15 min though he felt she did not lose pulse. Had 1 more episode of seizure on arrival via EMS.  In the ER unresponsive and intubated 8/4-8/5 then 8/6-8/12.   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 PCA cryptogenic infarct on the left side January 2024 (Zio patch without atrial fibrillation) on antiplatelet therapy, ongoing smoking, intermittent UTI periodically on Cipro   Clinical Impression Pt demonstrates modest improvement in swallowing though mentation and oral hygiene has significantly improved. Oral residue is now minimal and there is no residue clinging to posterior pharyngeal wall. Pt continues to demosntrate severely deminised hyoid excursion, tonge base retraction and epiglottic deflection impacting bolu propulsion through the upper pharynx. There is still moderate vallecular residue with puree and nectar, but pt is better able to follow strategies to compensate. She has silent aspiration of thin and no sensation or residue, so SLP used videofeedback to demonstrate this. Pt was most successful with a super supraglottic swallow with nectar and puree (pt elevates larynx and sustains  vestibular closure independently as likely a subconscious baseline strategy but needs a cue to clear throat and swallow again. Chin tuck ineffective. Head turn with a liquid wash seemed to help clear vallecular residue, but even better was a cued hock and spit, which again pt likely does independently without awareness. Pts swallow may not be any better or worse than her baseline function, but she is potentially more vulnerable and weak. Recommend puree/nectar with f/u for tolerance. Factors that may increase risk of adverse event in presence of aspiration [name] [name] 2021): Frail or deconditioned;Weak cough  Swallow Evaluation Recommendations Recommendations: PO diet PO Diet Recommendation: Dysphagia 1 (Pureed);Mildly thick liquids (Level 2, nectar thick) Liquid Administration via: Cup;Straw Medication Administration: Crushed with puree Supervision: Patient able to self-feed;Intermittent supervision/cueing for swallowing strategies Swallowing strategies  : Follow solids with liquids;Multiple dry swallows after each bite/sip;Clear throat intermittently;Hold breath during swallow, followed by a cough (super supraglottic swallow) Postural changes: Position pt fully upright for meals Oral care recommendations: Oral care BID (2x/day)      Masaye Gatchalian, [name] 12/29/2022,12:14 PM

## 2022-12-29 NOTE — Progress Notes (Signed)
Physical Therapy Treatment Patient Details Name: Krystal Peterson MRN: [id number] DOB: Oct 03, 1955 Today's Date: 12/29/2022   History of Present Illness Pt is 67 year old presented to [facility] on  12/14/22 for with stroke like symptoms after a seizure. Had been off ETOH due to n/v. Pt with severe sepsis with septic shock MSSA PNA. Intubated on admission. Extubated 8/5. Reintubated 8/6. Extubated 8/12.  PMH - CVA, ICD, depression, HTN, etoh use disorder, tongue CA, RA    PT Comments  Progressing towards acute functional goals. Still requires up to min assist with gait, utilizing RW for support, sporadic bouts of instability with scissoring and staggering towards left. Cues for safety and surrounding awareness, drifts into items unless cued occasionally. No overt buckling. Reviewed LE exercises. Encouraged OOB more frequently with staff. Reports nausea today but otherwise doing well. Patient will continue to benefit from skilled physical therapy services to further improve independence with functional mobility.    If plan is discharge home, recommend the following: Assist for transportation;Direct supervision/assist for medications management;Direct supervision/assist for financial management;Assistance with cooking/housework;A little help with walking and/or transfers;A little help with bathing/dressing/bathroom;Help with stairs or ramp for entrance   Can travel by private vehicle     Yes  Equipment Recommendations  Other (comment) (To be determined)    Recommendations for Other Services       Precautions / Restrictions Precautions Precautions: Fall Precaution Comments: cortrak Restrictions Weight Bearing Restrictions: No     Mobility  Bed Mobility Overal bed mobility: Needs Assistance Bed Mobility: Supine to Sit     Supine to sit: Contact guard     General bed mobility comments: CGA for safety, rising from bed without physical assist.    Transfers Overall transfer level: Needs  assistance Equipment used: Rolling walker (2 wheels) Transfers: Sit to/from Stand Sit to Stand: Contact guard assist           General transfer comment: CGA for safety, improved power-up today. VC for set-up with RW placement before rising. Fair control with descent into chair, but needed cues for awareness of position to chair before sitting.    Ambulation/Gait Ambulation/Gait assistance: Min assist Gait Distance (Feet): 150 Feet Assistive device: Rolling walker (2 wheels) Gait Pattern/deviations: Step-through pattern, Decreased stride length, Shuffle, Drifts right/left, Scissoring, Staggering left Gait velocity: decr Gait velocity interpretation: <1.8 ft/sec, indicate of risk for recurrent falls   General Gait Details: RW for support. Showing intermittent instability including occasional stagger towards Lt, and scissoring on the rare occasion. VC for awareness, RW use and placement, with one episode requiring up to min assist to correct. VC for leaving additional room as she approaches and passes obstacles in room and hallway, drifts into items at times (~25%) but corrects if cued.   Stairs             Wheelchair Mobility     Tilt Bed    Modified Rankin (Stroke Patients Only)       Balance Overall balance assessment: Needs assistance Sitting-balance support: Feet supported, No upper extremity supported Sitting balance-Leahy Scale: Fair     Standing balance support: During functional activity, Single extremity supported Standing balance-Leahy Scale: Poor Standing balance comment: CGA                            Cognition Arousal: Alert Behavior During Therapy: WFL for tasks assessed/performed Overall Cognitive Status: No family/caregiver present to determine baseline cognitive functioning  General Comments: Some difficulty with recall. Requires VC for scanning, reduced awareness of deficits         Exercises General Exercises - Lower Extremity Ankle Circles/Pumps: AROM, Both, 10 reps, Seated Quad Sets: Strengthening, Both, 15 reps, Seated Gluteal Sets: Strengthening, Both, 15 reps, Seated Long Arc Quad: 10 reps, Seated, Strengthening, Both Hip ABduction/ADduction: AROM, Right, Left, 10 reps, Seated, Strengthening    General Comments        Pertinent Vitals/Pain Pain Assessment Pain Assessment: No/denies pain    Home Living                          Prior Function            PT Goals (current goals can now be found in the care plan section) Acute Rehab PT Goals Patient Stated Goal: Feel better PT Goal Formulation: Patient unable to participate in goal setting Time For Goal Achievement: 01/05/23 Potential to Achieve Goals: Good Progress towards PT goals: Progressing toward goals    Frequency    Min 1X/week      PT Plan      Co-evaluation              AM-PAC PT "6 Clicks" Mobility   Outcome Measure  Help needed turning from your back to your side while in a flat bed without using bedrails?: None Help needed moving from lying on your back to sitting on the side of a flat bed without using bedrails?: A Little Help needed moving to and from a bed to a chair (including a wheelchair)?: A Little Help needed standing up from a chair using your arms (e.g., wheelchair or bedside chair)?: A Little Help needed to walk in hospital room?: A Little Help needed climbing 3-5 steps with a railing? : Total 6 Click Score: 17    End of Session Equipment Utilized During Treatment: Gait belt Activity Tolerance: Patient tolerated treatment well Patient left: with call bell/phone within reach;in chair;with chair alarm set Nurse Communication: Mobility status PT Visit Diagnosis: Unsteadiness on feet (R26.81);Other abnormalities of gait and mobility (R26.89);Muscle weakness (generalized) (M62.81)     Time: 5366-4403 PT Time Calculation (min) (ACUTE ONLY): 18  min  Charges:    $Gait Training: 8-22 mins PT General Charges $$ ACUTE PT VISIT: 1 Visit                     Kathlyn Sacramento, PT, DPT [facility]  Rehabilitation Services Physical Therapist Office: [phone] Website: [email or web address]    Berton Mount 12/29/2022, 2:23 PM

## 2022-12-29 NOTE — Progress Notes (Signed)
Nutrition Follow-up  DOCUMENTATION CODES:   Severe malnutrition in context of chronic illness  INTERVENTION:   Transition to nocturnal tube feeds via Cortrak: - Osmolite 1.5 @ 65 ml/hr x 12 hours from 1800 to 0600 (total of 780 ml) - PROSource TF20 60 ml daily  Nocturnal tube feeding regimen provides 1250 kcal, 69 grams of protein, and 594 ml of H2O (meets 78% minimum kcal needs and 86% minimum protein needs).  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  Ongoing, being addressed via diet advancement, oral nutrition supplements, and tube feeds  GOAL:   Patient will meet greater than or equal to 90% of their needs  Progressing  MONITOR:   Vent status, Labs, Weight trends, TF tolerance, I & O's  REASON FOR ASSESSMENT:   Consult Enteral/tube feeding initiation and management  ASSESSMENT:   67 y.o. female presented to the ED with seizures. 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 Pt admitted with seizures.  8/04 - admitted, intubated, TF initiated at 20 ml/hr (was not advanced past trickle rate), D10 gtt added due to hypoglycemia 8/05 - extubated 8/06 - intubated, Cortrak placed (tip gastric)  8/12 - extubated 8/15 - s/p MBS, recommendations to continue NPO 8/19 - s/p MBS, diet advanced to dysphagia 1 with nectar-thick liquids  Diet advanced to dysphagia 1 with nectar-thick liquids earlier today. No meal completions recorded since diet was advanced. Cortrak remains in place with daytime  feeds ordered.  Tube feeds were not infusing at time of RD visit. RN in room and unsure why tube feeds were off. Pt reports receiving a meal tray and consuming a few bites of each item on the tray. Pt states that she dislikes the puree texture.  Discussed pt with MD. Given diet advancement and improved level of alertness, will transition pt to nocturnal tube feeds at a lower rate in hopes of stimulating appetite during the day and promoting PO intake. RN aware of plan.  Pt reports that she would be willing to consume an oral nutrition supplement. She states that she likes milkshakes. RD will order [employer] and [employer] with meals.  Discussed with pt the importance of increasing PO intake in order for Cortrak tube to be removed. Pt expresses understanding and states that she will try to eat more for dinner tonight even though she doesn't care for the texture.  Given multiple type 7 bowel movements yesterday and pressure injury developing on coccyx, will add Banatrol TF BID per tube to help with diarrhea.  Admit weight: 54.4 kg Current weight: 53.3 kg  Current TF: Osmolite 1.5 @ 83 ml/hr x 12 hours from 6AM to 6PM, PROSource TF20 60 ml daily  Medications reviewed and include: colace, folic acid, MVI with minerals, miralax, thiamine  Labs reviewed: sodium 133 on 8/18, WBC 12.2 CBG's: 90-114 x 24 hours  Diet Order:   Diet Order             DIET - DYS 1 Room service appropriate? No; Fluid consistency: Nectar Thick  Diet effective now                   EDUCATION NEEDS:   Not appropriate for education at this time  Skin:  Skin Assessment:  Skin Integrity Issues: Stage II: coccyx Other: abrasion to left upper flank  Last BM:  12/28/22 multiple type 7  Height:   Ht Readings from Last 1 Encounters:  12/19/22 5' 1.89" (1.572 m)    Weight:   Wt Readings from Last 1 Encounters:  12/26/22 53.3 kg    Ideal Body Weight:  50 kg  BMI:  Body mass index is 21.57  kg/m.  Estimated Nutritional Needs:   Kcal:  1600-1800  Protein:  80-90 grams  Fluid:  1.6-1.8 L    Mertie Clause, MS, RD, LDN Registered Dietitian II Please see AMiON for contact information.

## 2022-12-29 NOTE — Plan of Care (Signed)
  Problem: Education: Goal: Knowledge of General Education information will improve Description: Including pain rating scale, medication(s)/side effects and non-pharmacologic comfort measures Outcome: Progressing   Problem: Health Behavior/Discharge Planning: Goal: Ability to manage health-related needs will improve Outcome: Progressing   Problem: Clinical Measurements: Goal: Ability to maintain clinical measurements within normal limits will improve Outcome: Progressing Goal: Will remain free from infection Outcome: Progressing Goal: Diagnostic test results will improve Outcome: Progressing Goal: Respiratory complications will improve Outcome: Progressing Goal: Cardiovascular complication will be avoided Outcome: Progressing   Problem: Activity: Goal: Risk for activity intolerance will decrease Outcome: Progressing   Problem: Nutrition: Goal: Adequate nutrition will be maintained Outcome: Progressing   Problem: Coping: Goal: Level of anxiety will decrease Outcome: Progressing   Problem: Elimination: Goal: Will not experience complications related to bowel motility Outcome: Progressing Goal: Will not experience complications related to urinary retention Outcome: Progressing   Problem: Pain Managment: Goal: General experience of comfort will improve Outcome: Progressing   Problem: Safety: Goal: Ability to remain free from injury will improve Outcome: Progressing   Problem: Skin Integrity: Goal: Risk for impaired skin integrity will decrease Outcome: Progressing   Problem: Safety: Goal: Non-violent Restraint(s) Outcome: Progressing   Problem: Activity: Goal: Ability to tolerate increased activity will improve Outcome: Progressing   Problem: Respiratory: Goal: Ability to maintain a clear airway and adequate ventilation will improve Outcome: Progressing   Problem: Role Relationship: Goal: Method of communication will improve Outcome: Progressing   

## 2022-12-29 NOTE — Progress Notes (Signed)
PROGRESS NOTE  Krystal Peterson:[id number] DOB: October 22, 1955   PCP: Pediatrics, [place]  Patient is from: Home.  Lives with husband.  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  She was started on Keppra.  Apparently husband did CPR for 15 minutes per recommendation by EMS though he fell she did not lose pulse.  On arrival to ED, she was unresponsive and intubated and admitted to ICU.  Patient was extubated on 8/5 but remained agitated and reintubated.  Hospital course complicated by septic shock due to MSSA pneumonia, polymorphic VT x 2 on 8/7 aborted by AICD shock and prolonged agitation requiring mechanical ventilation and sedation.  Eventually, she came off vasopressors and sedation.  She was extubated on 8/12 and transferred to [facility] on 8/14 on 10 L by HFNC,  tube feed via cortrack, and IV Ancef for MSSA pneumonia.Marland Kitchen    Respiratory failure resolved.  Remains n.p.o. on TF.  Per SLP, dysphagia likely chronic.  Advance to dysphagia 1 diet on 8/19.    Subjective: Seen and examined earlier this morning.  No major events overnight of this morning.  No complaints.  Objective: Vitals:   12/29/22 0013 12/29/22 0349 12/29/22 0728 12/29/22 0857  BP: (!) 119/90 (!) 115/91 115/86   Pulse: 93 97 (!) 105 98  Resp:   18 20  Temp: 98.3 F (36.8 C) 98.1 F (36.7 C) 98.3 F (36.8 C)   TempSrc: Oral Oral    SpO2: 99% 97% 97% 98%  Weight:      Height:        Examination:  GENERAL: Appears frail.  No apparent distress. HEENT:  MMM.  Vision and hearing grossly intact.  NECK: Supple.  No apparent JVD.  RESP:  No IWOB.  Fair aeration bilaterally. CVS:  RRR. Heart sounds normal.  ABD/GI/GU: BS+. Abd soft, NTND.  MSK/EXT:   No apparent deformity. Moves extremities. No edema.  SKIN: no apparent skin lesion or wound NEURO: Sleepy but wakes to voice.  Oriented fairly.  No apparent focal neuro deficit. PSYCH: Calm. Normal affect.     Procedures:  8/4-8/12 intubation and mechanical ventilation  Microbiology summarized: 8/4-MRSA PCR screen negative 8/7 and 8/10-respiratory culture with Staph aureus resistant to erythromycin, clindamycin  Assessment and plan: Principal Problem:   Seizure (HCC) Active Problems:   Protein-calorie malnutrition, severe   Acute respiratory failure with hypoxia (HCC)   Status epilepticus (HCC)   Alcohol withdrawal seizure with complication (HCC)  Acute toxic/septic encephalopathy: In the setting of seizure, possible EtOH withdrawal, ICU delirium and sepsis due to MSSA bacteremia: CT head without acute finding.  Unable to obtain MRI due to AICD incompatibility.  Improved.  Awake and alert and oriented x 4 except date and day.  No focal neurodeficit. -Treat treatable causes -Reorientation and delirium precaution -Minimize or avoid sedating medications  Severe sepsis with septic shock due to MSSA pneumonia: Not POA.  -Completed antibiotic course with IV Ancef on 8/15 -Pulmonary toilet  Seizure likely due to alcohol withdrawal: EEG without active seizure but evidence of potential epileptogenicity arising from the right  temporal region and moderate diffuse encephalopathy. -Continue Keppra 1 g twice daily per neurology  EtOH withdrawal with agitation and seizure: Currently no withdrawal symptoms. Completed phenobarbital taper -Continue multivitamin, folic acid and thiamine  Polymorphic VT likely due to hypokalemia, hypomagnesemia: Aborted by ICD shock while in ICU. Prior history of VF  Arrest 2015 s/p ICD placement, prolonged QT syndrome -Continue telemetry monitoring -Avoid or minimize QT prolonging drugs -Optimize electrolytes   Acute hypoxemic/hypercapnic respiratory failure requiring mechanical ventilation in the setting of MSSA pneumonia, sepsis, mucous plugging and seizure:  TTE without significant finding.  Appears euvolemic on exam.  IV Lasix discontinued on 8/14 due to soft blood pressure.  Respiratory failure resolved. -Aspiration precaution, incentive telemetry, OOB/PT/OT -Continue scheduled and as needed nebulizers -Aspiration precaution. -Continue tube feed. -Started on dysphagia 1 diet per SLP.   Hypotension/history of hypertension: TSH and cortisol normal.  Recent TTE without significant finding.resolving. -Continue midodrine. -Continue low-dose metoprolol with parameters for tachycardia and history of VT.  Dysphagia: Per SLP, likely chronic.  She has a history of tongue cancer and chemoradiation in 2015.   -SLP advanced to dysphagia 1 diet on 8/19. -Continue tube feed via cortrak.   Tobacco dependence -Encouraged smoking cessation when able to comprehend -Continue nicotine patch  Physical deconditioning in the setting of acute illness -OOB and PT/OT   Prior history of stroke: CT head without acute finding.  Not able to obtain MRI due to AICD.  No focal neurodeficit. -Continue Lipitor and aspirin.  Right parietal cyst,  -follows with NSGY  Anxiety and depression: Stable -Continue Lexapro  Hypothyroidism: TSH normal. -Continue Synthroid.   Previous tongue cancer s/p chemoradiation in 2015 -Outpatient follow-up  Thrombocytosis/mild leukocytosis: Unclear etiology of this.  She has completed antibiotic course for MSSA pneumonia.  She has no fever, cardiopulmonary, GI or UTI symptoms.  Hemodynamically stable. -Continue monitoring -Continue low-dose aspirin  Severe protein calorie malnutrition Body mass index is 21.57 kg/m. 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 (Active)  12/25/22 2123  Location: Coccyx  Location Orientation: Mid  Staging: Stage 2 -  Partial thickness loss of dermis presenting as a shallow open injury with a red, pink wound bed without slough.  Wound Description (Comments):   Present on Admission: No  Dressing Type Foam - Lift dressing to assess site every shift 12/29/22 0731   DVT prophylaxis:  enoxaparin (LOVENOX) injection 40 mg Start: 12/17/22 0915  Code Status: DNR/DNI Family Communication: None at bedside. Level of care: Telemetry Medical Status is: Inpatient Remains inpatient appropriate because: Dysphagia   Final disposition: Likely home with home health. Consultants:  Pulmonology admitted patient Neurology  35 minutes with more than 50% spent in reviewing records, counseling patient/family and coordinating care.   Sch Meds:  Scheduled Meds:  arformoterol  15 mcg Nebulization BID   aspirin  81 mg Per Tube Daily   atorvastatin  40 mg Per Tube QPM   budesonide (PULMICORT) nebulizer solution  0.5 mg Nebulization BID   Chlorhexidine Gluconate Cloth  6 each Topical Daily   docusate  100 mg Per Tube QHS   enoxaparin (LOVENOX) injection  40 mg Subcutaneous Q24H   escitalopram  20 mg Per Tube  Daily   feeding supplement (OSMOLITE 1.5 CAL)  1,000 mL Per Tube Q24H   feeding supplement (PROSource TF20)  60 mL Per Tube Daily   folic acid  1 mg Per Tube Daily   guaiFENesin  15 mL Per Tube Q6H   levETIRAcetam  1,000 mg Per Tube BID   levothyroxine  100 mcg Per Tube Q0600   lidocaine  2 patch Transdermal Q24H   metoprolol  tartrate  12.5 mg Per Tube BID   midodrine  5 mg Per Tube TID WC   multivitamin with minerals  1 tablet Per Tube Daily   nicotine  14 mg Transdermal Daily   polyethylene glycol  17 g Per Tube QHS   revefenacin  175 mcg Nebulization Daily   sodium chloride flush  10-40 mL Intracatheter Q12H   thiamine  100 mg Per Tube Daily   Continuous Infusions:  sodium chloride Stopped (12/15/22 0859)   PRN Meds:.acetaminophen, bisacodyl, levalbuterol **AND** ipratropium, mouth rinse, sodium chloride flush  Antimicrobials: Anti-infectives (From admission, onward)    Start     Dose/Rate Route Frequency Ordered Stop   12/20/22 1600  ceFAZolin (ANCEF) IVPB 2g/100 mL premix        2 g 200 mL/hr over 30 Minutes Intravenous Every 8 hours 12/20/22 1517 12/25/22 2341   12/19/22 1700  ceFAZolin (ANCEF) IVPB 2g/100 mL premix  Status:  Discontinued        2 g 200 mL/hr over 30 Minutes Intravenous Every 12 hours 12/19/22 1342 12/20/22 1517   12/17/22 0600  piperacillin-tazobactam (ZOSYN) IVPB 3.375 g  Status:  Discontinued        3.375 g 12.5 mL/hr over 240 Minutes Intravenous Every 8 hours 12/17/22 0449 12/19/22 1342        I have personally reviewed the following labs and images: CBC: Recent Labs  Lab 12/25/22 1115 12/26/22 0142 12/27/22 0427 12/28/22 0320 12/29/22 0550  WBC 14.6* 11.1* 12.8* 13.1* 12.2*  NEUTROABS  --   --   --   --  9.3*  HGB 13.0 10.4* 11.6* 12.0 11.5*  HCT 39.3 32.4* 35.4* 36.5 34.5*  MCV 104.0* 103.5* 102.9* 102.2* 100.6*  PLT 688* 588* 712* 854* 885*   BMP &GFR Recent Labs  Lab 12/23/22 0358 12/24/22 1256 12/25/22 1115 12/26/22 0142 12/27/22 0427 12/28/22 0320  NA 140 140  139 137 138 134* 133*  K 3.3* 4.1  4.0 4.3 3.7 3.8 4.2  CL 104 93*  93* 96* 101 101 100  CO2 26 30  29 27 27 23  21*  GLUCOSE 94 177*  200* 175* 96 88 84  BUN 14 34*  34* 37* 28* 22 18  CREATININE 0.77 1.22*  1.31* 1.39* 1.00 0.94 0.68  CALCIUM 7.2* 9.3  9.1 8.9 8.3* 8.7* 8.8*   MG  --  2.6* 2.7*  --  2.2 2.1  PHOS 3.3 4.4  4.4 3.7  --  2.9 2.8   Estimated Creatinine Clearance: 53.6 mL/min (by C-G formula based on SCr of 0.68 mg/dL). Liver & Pancreas: Recent Labs  Lab 12/24/22 1256 12/25/22 1115 12/26/22 0142 12/27/22 0427 12/28/22 0320  AST 115* 128* 66*  --  39  ALT 47* 45* 24  --  25  ALKPHOS 185* 212* 160*  --  182*  BILITOT 0.4 0.3 0.3  --  0.7  PROT 7.4 7.4 6.4*  --  7.0  ALBUMIN 2.2*  2.2* 2.3* 2.4* 2.5* 2.6*   No results for input(s): "LIPASE", "AMYLASE" in the last  168 hours. No results for input(s): "AMMONIA" in the last 168 hours. Diabetic: No results for input(s): "HGBA1C" in the last 72 hours. Recent Labs  Lab 12/28/22 0606 12/28/22 1121 12/28/22 1618 12/28/22 2342 12/29/22 0614  GLUCAP 91 113* 114* 90 95   Cardiac Enzymes: Recent Labs  Lab 12/24/22 1315 12/25/22 1115  CKTOTAL 41 36*   No results for input(s): "PROBNP" in the last 8760 hours. Coagulation Profile: No results for input(s): "INR", "PROTIME" in the last 168 hours. Thyroid Function Tests: No results for input(s): "TSH", "T4TOTAL", "FREET4", "T3FREE", "THYROIDAB" in the last 72 hours. Lipid Profile: No results for input(s): "CHOL", "HDL", "LDLCALC", "TRIG", "CHOLHDL", "LDLDIRECT" in the last 72 hours. Anemia Panel: No results for input(s): "VITAMINB12", "FOLATE", "FERRITIN", "TIBC", "IRON", "RETICCTPCT" in the last 72 hours. Urine analysis: No results found for: "COLORURINE", "APPEARANCEUR", "LABSPEC", "PHURINE", "GLUCOSEU", "HGBUR", "BILIRUBINUR", "KETONESUR", "PROTEINUR", "UROBILINOGEN", "NITRITE", "LEUKOCYTESUR" Sepsis Labs: Invalid input(s): "PROCALCITONIN", "LACTICIDVEN"  Microbiology: Recent Results (from the past 240 hour(s))  Culture, Respiratory w Gram Stain     Status: None   Collection Time: 12/20/22 11:37 AM   Specimen: Bronchoalveolar Lavage; Respiratory  Result Value Ref Range Status   Specimen Description BRONCHIAL ALVEOLAR LAVAGE  Final    Special Requests NONE  Final   Gram Stain   Final    RARE SQUAMOUS EPITHELIAL CELLS PRESENT ABUNDANT WBC PRESENT, PREDOMINANTLY PMN RARE GRAM POSITIVE COCCI Performed at [facility], [address], [place], [place] [address]    Culture MODERATE STAPHYLOCOCCUS AUREUS  Final   Report Status 12/22/2022 FINAL  Final   Organism ID, Bacteria STAPHYLOCOCCUS AUREUS  Final      Susceptibility   Staphylococcus aureus - MIC*    CIPROFLOXACIN <=0.5 SENSITIVE Sensitive     ERYTHROMYCIN RESISTANT Resistant     GENTAMICIN <=0.5 SENSITIVE Sensitive     OXACILLIN <=0.25 SENSITIVE Sensitive     TETRACYCLINE <=1 SENSITIVE Sensitive     VANCOMYCIN <=0.5 SENSITIVE Sensitive     TRIMETH/SULFA <=10 SENSITIVE Sensitive     CLINDAMYCIN RESISTANT Resistant     RIFAMPIN <=0.5 SENSITIVE Sensitive     Inducible Clindamycin POSITIVE Resistant     LINEZOLID 2 SENSITIVE Sensitive     * MODERATE STAPHYLOCOCCUS AUREUS    Radiology Studies: DG Swallowing Func-Speech Pathology  Result Date: 12/29/2022 Table formatting from the original result was not included. Modified Barium Swallow Study Patient Details Name: DYMPHNA GALESKI MRN: [id number] Date of Birth: 08-Feb-1956 Today's Date: 12/29/2022 HPI/PMH: HPI: 67 year old lady,  had some nausea and vomiting and diarrhea 12/12/22 followng eating bad mushrooms for few days and therefore quit drinking alcohol which she normally does on a regular basis.  Then husband witnessed seizure by the husband with a left gaze.  Tonic-clonic generalized.   Husband did CPR on advise by EMS for 15 min though he felt she did not lose pulse. Had 1 more episode of seizure on arrival via EMS.  In the ER unresponsive and intubated 8/4-8/5 then 8/6-8/12.   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 with subsequent dysphagia per famirly,  Husband reports  stroke 5 months ago with dysphagia. PCA cryptogenic infarct on the left side January 2024 (Zio patch without atrial fibrillation) on antiplatelet therapy, ongoing smoking, intermittent UTI periodically on Cipro Clinical Impression: Clinical Impression: Pt demonstrates modest improvement in swallowing though mentation and oral hygiene has significantly  improved. Oral residue is now minimal and there is no residue clinging to posterior pharyngeal wall. Pt continues to demosntrate severely deminised hyoid excursion, tongue base retraction and epiglottic deflection impacting bolu propulsion through the upper pharynx. There is still moderate vallecular residue with puree and nectar, but pt is better able to follow strategies to compensate. She has silent aspiration of thin and no sensation or residue, so SLP used videofeedback to demonstrate this. Pt was most successful with a super supraglottic swallow with nectar and puree (pt elevates larynx and sustains vestibular closure independently as likely a subconscious baseline strategy but needs a cue to clear throat and swallow again. Chin tuck ineffective. Head turn with a liquid wash seemed to help clear vallecular residue, but even better was a cued hock and spit, which again pt likely does independently without awareness. Pts swallow may not be any better or worse than her baseline function, but she is potentially more vulnerable and weak. Recommend puree/nectar with f/u for tolerance. Factors that may increase risk of adverse event in presence of aspiration [name] & [name] 2021): Factors that may increase risk of adverse event in presence of aspiration [name] & [name] 2021): Frail or deconditioned; Weak cough Recommendations/Plan: Swallowing Evaluation Recommendations Swallowing Evaluation Recommendations Recommendations: PO diet PO Diet Recommendation: Dysphagia 1 (Pureed); Mildly thick liquids (Level 2, nectar thick) Liquid Administration via: Cup; Straw  Medication Administration: Crushed with puree Supervision: Patient able to self-feed; Intermittent supervision/cueing for swallowing strategies Swallowing strategies  : Follow solids with liquids; Multiple dry swallows after each bite/sip; Clear throat intermittently; Hold breath during swallow, followed by a cough (super supraglottic swallow) Postural changes: Position pt fully upright for meals Oral care recommendations: Oral care BID (2x/day) Treatment Plan Treatment Plan Treatment recommendations: Therapy as outlined in treatment plan below Follow-up recommendations: Skilled nursing-short term rehab (<3 hours/day) Functional status assessment: Patient has had a recent decline in their functional status and demonstrates the ability to make significant improvements in function in a reasonable and predictable amount of time. Treatment frequency: Min 2x/week Treatment duration: 2 weeks Interventions: Diet toleration management by SLP; Trials of upgraded texture/liquids; Patient/family education; Compensatory techniques Recommendations Recommendations for follow up therapy are one component of a multi-disciplinary discharge planning process, led by the attending physician.  Recommendations may be updated based on patient status, additional functional criteria and insurance authorization. Assessment: Orofacial Exam: No data recorded Anatomy: Anatomy: WFL Boluses Administered: Boluses Administered Boluses Administered: Thin liquids (Level 0); Mildly thick liquids (Level 2, nectar thick); Puree  Oral Impairment Domain: Oral Impairment Domain Lip Closure: No labial escape Tongue control during bolus hold: Escape to lateral buccal cavity/floor of mouth; Posterior escape of less than half of bolus Bolus transport/lingual motion: Brisk tongue motion Oral residue: Residue collection on oral structures Location of oral residue : Tongue; Palate Initiation of pharyngeal swallow : Posterior laryngeal surface of the epiglottis;  Pyriform sinuses  Pharyngeal Impairment Domain: Pharyngeal Impairment Domain Soft palate elevation: No bolus between soft palate (SP)/pharyngeal wall (PW) Laryngeal elevation: Complete superior movement of thyroid cartilage with complete approximation of arytenoids to epiglottic petiole Anterior hyoid excursion: Partial anterior movement Epiglottic movement: No inversion Laryngeal vestibule closure: Incomplete, narrow column air/contrast in laryngeal vestibule Pharyngeal stripping wave : Present - complete Pharyngeal contraction (A/P view only): N/A Pharyngoesophageal segment opening: Complete distension and complete duration, no obstruction of flow Tongue base retraction: Wide column of contrast or air between tongue base and PPW Pharyngeal residue: Complete pharyngeal clearance Location of pharyngeal residue: Tongue  base; Valleculae  Esophageal Impairment Domain: No data recorded Pill: No data recorded Penetration/Aspiration Scale Score: Penetration/Aspiration Scale Score 1.  Material does not enter airway: Puree 3.  Material enters airway, remains ABOVE vocal cords and not ejected out: Mildly thick liquids (Level 2, nectar thick) 5.  Material enters airway, CONTACTS cords and not ejected out: Mildly thick liquids (Level 2, nectar thick); Thin liquids (Level 0) 8.  Material enters airway, passes BELOW cords without attempt by patient to eject out (silent aspiration) : Thin liquids (Level 0) Compensatory Strategies: Compensatory Strategies Compensatory strategies: Yes Straw: Effective Effective Straw: Mildly thick liquid (Level 2, nectar thick) Effortful swallow: Effective Effective Effortful Swallow: Mildly thick liquid (Level 2, nectar thick); Puree Multiple swallows: Effective Effective Multiple Swallows: Mildly thick liquid (Level 2, nectar thick); Puree Chin tuck: Ineffective Ineffective Chin Tuck: Thin liquid (Level 0); Mildly thick liquid (Level 2, nectar thick) Liquid wash: Effective Effective Liquid Wash:  Mildly thick liquid (Level 2, nectar thick) Left head turn: Effective Effective Left Head Turn: Mildly thick liquid (Level 2, nectar thick); Puree Right head turn: Ineffective Supraglottic swallow: -- (intuitive) Super supraglottic swallow: Effective Effective Super Supraglottic Swallow: Mildly thick liquid (Level 2, nectar thick); Puree Oral bolus hold: Ineffective Ineffective Oral Bolus Hold : Thin liquid (Level 0); Mildly thick liquid (Level 2, nectar thick)   General Information: Caregiver present: No  Diet Prior to this Study: NPO   Temperature : Normal   Respiratory Status: WFL   No data recorded  History of Recent Intubation: Yes  Behavior/Cognition: Alert; Cooperative; Pleasant mood Self-Feeding Abilities: Needs assist with self-feeding Baseline vocal quality/speech: Normal Volitional Cough: Able to elicit Volitional Swallow: Able to elicit No data recorded Goal Planning: Prognosis for improved oropharyngeal function: Fair Barriers to Reach Goals: Severity of deficits; Time post onset No data recorded No data recorded Consulted and agree with results and recommendations: Patient; Physician; Nurse Pain: No data recorded End of Session: Start Time:SLP Start Time (ACUTE ONLY): 1100 Stop Time: SLP Stop Time (ACUTE ONLY): 1120 Time Calculation:SLP Time Calculation (min) (ACUTE ONLY): 20 min Charges: SLP Evaluations $ SLP Speech Visit: 1 Visit SLP Evaluations $MBS Swallow: 1 Procedure $Swallowing Treatment: 1 Procedure SLP visit diagnosis: SLP Visit Diagnosis: Dysphagia, oropharyngeal phase (R13.12) Past Medical History: Past Medical History: Diagnosis Date  Hypertension   Hypothyroidism   Prolonged Q-T interval on ECG   Stroke (HCC)   Tongue cancer (HCC) 2013  resected & chemo at [facility]- [id number]  VT (ventricular tachycardia) (HCC)  DeBlois, [name] 12/29/2022, 12:15 PM      Jennifier Smitherman T. Shaylon Gillean [facility]  If 7PM-7AM, please contact night-coverage [email or web address] 12/29/2022, 12:23 PM

## 2022-12-30 ENCOUNTER — Inpatient Hospital Stay (HOSPITAL_COMMUNITY): Payer: Medicare Other

## 2022-12-30 DIAGNOSIS — A419 Sepsis, unspecified organism: Secondary | ICD-10-CM | POA: Diagnosis not present

## 2022-12-30 DIAGNOSIS — G40901 Epilepsy, unspecified, not intractable, with status epilepticus: Secondary | ICD-10-CM | POA: Diagnosis not present

## 2022-12-30 DIAGNOSIS — J15211 Pneumonia due to Methicillin susceptible Staphylococcus aureus: Secondary | ICD-10-CM | POA: Diagnosis not present

## 2022-12-30 DIAGNOSIS — J9601 Acute respiratory failure with hypoxia: Secondary | ICD-10-CM | POA: Diagnosis not present

## 2022-12-30 LAB — RENAL FUNCTION PANEL
Albumin: 2.7 g/dL — ABNORMAL LOW (ref 3.5–5.0)
Anion gap: 14 (ref 5–15)
BUN: 19 mg/dL (ref 8–23)
CO2: 23 mmol/L (ref 22–32)
Calcium: 9.5 mg/dL (ref 8.9–10.3)
Chloride: 99 mmol/L (ref 98–111)
Creatinine, Ser: 0.79 mg/dL (ref 0.44–1.00)
GFR, Estimated: >60 mL/min (ref >60)
Glucose, Bld: 74 mg/dL (ref 70–99)
Phosphorus: 4.2 mg/dL (ref 2.5–4.6)
Potassium: 4.2 mmol/L (ref 3.5–5.1)
Sodium: 136 mmol/L (ref 135–145)

## 2022-12-30 LAB — HEPATIC FUNCTION PANEL
ALT: 40 U/L (ref 0–44)
ALT: 35 U/L (ref 0–44)
AST: 40 U/L (ref 15–41)
AST: 33 U/L (ref 15–41)
Albumin: 2.6 g/dL — ABNORMAL LOW (ref 3.5–5.0)
Albumin: 2.4 g/dL — ABNORMAL LOW (ref 3.5–5.0)
Alkaline Phosphatase: 153 U/L — ABNORMAL HIGH (ref 38–126)
Alkaline Phosphatase: 144 U/L — ABNORMAL HIGH (ref 38–126)
Bilirubin, Direct: <0.1 mg/dL (ref 0.0–0.2)
Bilirubin, Direct: <0.1 mg/dL (ref 0.0–0.2)
Total Bilirubin: 0.3 mg/dL (ref 0.3–1.2)
Total Bilirubin: 0.2 mg/dL — ABNORMAL LOW (ref 0.3–1.2)
Total Protein: 7.2 g/dL (ref 6.5–8.1)
Total Protein: 6.4 g/dL — ABNORMAL LOW (ref 6.5–8.1)

## 2022-12-30 LAB — CBC
HCT: 35.1 % — ABNORMAL LOW (ref 36.0–46.0)
HCT: 34.3 % — ABNORMAL LOW (ref 36.0–46.0)
Hemoglobin: 11.5 g/dL — ABNORMAL LOW (ref 12.0–15.0)
Hemoglobin: 11.4 g/dL — ABNORMAL LOW (ref 12.0–15.0)
MCH: 33.4 pg (ref 26.0–34.0)
MCH: 33.7 pg (ref 26.0–34.0)
MCHC: 32.8 g/dL (ref 30.0–36.0)
MCHC: 33.2 g/dL (ref 30.0–36.0)
MCV: 102 fL — ABNORMAL HIGH (ref 80.0–100.0)
MCV: 101.5 fL — ABNORMAL HIGH (ref 80.0–100.0)
Platelets: 957 10*3/uL (ref 150–400)
Platelets: 968 10*3/uL (ref 150–400)
RBC: 3.44 MIL/uL — ABNORMAL LOW (ref 3.87–5.11)
RBC: 3.38 MIL/uL — ABNORMAL LOW (ref 3.87–5.11)
RDW: 14.8 % (ref 11.5–15.5)
RDW: 14.7 % (ref 11.5–15.5)
WBC: 11.7 10*3/uL — ABNORMAL HIGH (ref 4.0–10.5)
WBC: 12.4 10*3/uL — ABNORMAL HIGH (ref 4.0–10.5)
nRBC: 0 % (ref 0.0–0.2)
nRBC: 0 % (ref 0.0–0.2)

## 2022-12-30 LAB — DIFFERENTIAL
Abs Immature Granulocytes: 0.07 10*3/uL (ref 0.00–0.07)
Basophils Absolute: 0.1 10*3/uL (ref 0.0–0.1)
Basophils Relative: 1 %
Eosinophils Absolute: 0.5 10*3/uL (ref 0.0–0.5)
Eosinophils Relative: 4 %
Immature Granulocytes: 1 %
Lymphocytes Relative: 13 %
Lymphs Abs: 1.6 10*3/uL (ref 0.7–4.0)
Monocytes Absolute: 1.1 10*3/uL — ABNORMAL HIGH (ref 0.1–1.0)
Monocytes Relative: 9 %
Neutro Abs: 9 10*3/uL — ABNORMAL HIGH (ref 1.7–7.7)
Neutrophils Relative %: 72 %

## 2022-12-30 LAB — GLUCOSE, CAPILLARY
Glucose-Capillary: 97 mg/dL (ref 70–99)
Glucose-Capillary: 111 mg/dL — ABNORMAL HIGH (ref 70–99)
Glucose-Capillary: 88 mg/dL (ref 70–99)
Glucose-Capillary: 90 mg/dL (ref 70–99)

## 2022-12-30 LAB — TECHNOLOGIST SMEAR REVIEW

## 2022-12-30 LAB — FERRITIN: Ferritin: 148 ng/mL (ref 11–307)

## 2022-12-30 LAB — MAGNESIUM: Magnesium: 2.1 mg/dL (ref 1.7–2.4)

## 2022-12-30 LAB — PLATELET FUNCTION ASSAY
Collagen / ADP: 118 seconds (ref 0–118)
Collagen / Epinephrine: >300 seconds — ABNORMAL HIGH (ref 0–193)

## 2022-12-30 MED ORDER — LACTATED RINGERS IV SOLN: INTRAVENOUS | Status: AC

## 2022-12-30 MED ORDER — LACTATED RINGERS IV BOLUS
250.0000 mL | INTRAVENOUS | Status: AC
  Administered 2022-12-30: 250 mL via INTRAVENOUS

## 2022-12-30 MED ORDER — HALOPERIDOL LACTATE 5 MG/ML IJ SOLN
2.0000 mg | Freq: Four times a day (QID) | INTRAMUSCULAR | Status: AC | PRN
  Administered 2022-12-30: 2 mg via INTRAVENOUS
  Filled 2022-12-30: qty 1

## 2022-12-30 NOTE — Progress Notes (Signed)
PROGRESS NOTE  Krystal Peterson NAT:[id number] DOB: 1955-12-03   PCP: Pediatrics, [place]  Patient is from: Home.  Lives with husband.  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  She was started on Keppra.  Apparently husband did CPR for 15 minutes per recommendation by EMS though he fell she did not lose pulse.  On arrival to ED, she was unresponsive and intubated and admitted to ICU.  Patient was extubated on 8/5 but remained agitated and reintubated.  Hospital course complicated by septic shock due to MSSA pneumonia, polymorphic VT x 2 on 8/7 aborted by AICD shock and prolonged agitation requiring mechanical ventilation and sedation.  Eventually, she came off vasopressors and sedation.  She was extubated on 8/12 and transferred [facility] on 8/14 on 10 L by HFNC,  tube feed via cortrack, and IV Ancef for MSSA pneumonia.Marland Kitchen    Respiratory failure resolved.  Remains n.p.o. on TF.  Per SLP, dysphagia likely chronic.  Advance to dysphagia 1 diet on 8/19.    Subjective: Seen and examined earlier this morning.  No major events overnight of this morning.  No complaints.  Tolerating dysphagia 1 diet.  Objective: Vitals:   12/30/22 0419 12/30/22 0500 12/30/22 0736 12/30/22 1118  BP: 109/78  117/75 107/75  Pulse: 95  88 87  Resp: 18  18 20   Temp: 97.7 F (36.5 C)  97.9 F (36.6 C) 97.7 F (36.5 C)  TempSrc: Oral  Oral Axillary  SpO2: 94%  98% 98%  Weight:  53 kg    Height:        Examination:  GENERAL: Appears frail.  No  apparent distress. HEENT: MMM.  Vision and hearing grossly intact.  NECK: Supple.  No apparent JVD.  RESP:  No IWOB.  Fair aeration bilaterally. CVS:  RRR. Heart sounds normal.  ABD/GI/GU: BS+. Abd soft, NTND.  MSK/EXT:   No apparent deformity. Moves extremities. No edema.  SKIN: no apparent skin lesion or wound NEURO: Sleepy but wakes to voice.  Oriented fairly.  No apparent focal neuro deficit. PSYCH: Calm. Normal affect.     Procedures:  8/4-8/12 intubation and mechanical ventilation  Microbiology summarized: 8/4-MRSA PCR screen negative 8/7 and 8/10-respiratory culture with Staph aureus resistant to erythromycin, clindamycin  Assessment and plan: Principal Problem:   Seizure (HCC) Active Problems:   Protein-calorie malnutrition, severe   Acute respiratory failure with hypoxia (HCC)   Status epilepticus (HCC)   Alcohol withdrawal seizure with complication (HCC)  Acute toxic/septic encephalopathy: In the setting of seizure, possible EtOH withdrawal, ICU delirium and sepsis due to MSSA bacteremia: CT head without acute finding.  Unable to obtain MRI due to AICD incompatibility.  Resolved.  Awake and alert and oriented x 4 except date and day.  No focal neurodeficit. -Treat treatable causes -Reorientation and delirium precaution -Minimize or avoid sedating medications  Severe sepsis with septic shock due to MSSA pneumonia: Not POA.  -Completed antibiotic course with IV Ancef on 8/15 -Pulmonary toilet  Seizure likely due to alcohol withdrawal: EEG without active seizure but evidence of potential epileptogenicity arising  from the right temporal region and moderate diffuse encephalopathy. -Continue Keppra 1 g twice daily per neurology  EtOH withdrawal with agitation and seizure: Currently no withdrawal symptoms. Completed phenobarbital taper -Continue multivitamin, folic acid and thiamine  Polymorphic VT likely due to hypokalemia, hypomagnesemia: Aborted by ICD shock while in  ICU. Prior history of VF Arrest 2015 s/p ICD placement, prolonged QT syndrome -Continue telemetry monitoring -Avoid or minimize QT prolonging drugs -Optimize electrolytes   Acute hypoxemic/hypercapnic respiratory failure requiring mechanical ventilation in the setting of MSSA pneumonia, sepsis, mucous plugging and seizure:  TTE without significant finding.  Appears euvolemic on exam.  IV Lasix discontinued on 8/14 due to soft blood pressure.  Respiratory failure resolved. -Aspiration precaution, incentive telemetry, OOB/PT/OT -Continue scheduled and as needed nebulizers -Aspiration precaution.   Hypotension/history of hypertension: TSH and cortisol normal.  Recent TTE without significant finding.resolving. -Continue midodrine. -Continue low-dose metoprolol with parameters for tachycardia and history of VT.  Dysphagia: Per SLP, likely chronic.  She has a history of tongue cancer and chemoradiation in 2015.   -SLP advanced to dysphagia 1 diet on 8/19.  Tube feeds changed to nocturnal  Tobacco dependence -Encouraged smoking cessation when able to comprehend -Continue nicotine patch  Physical deconditioning in the setting of acute illness -OOB and PT/OT   Prior history of stroke: CT head without acute finding.  Not able to obtain MRI due to AICD.  No focal neurodeficit. -Continue Lipitor and aspirin.  Right parietal cyst,  -follows with NSGY  Anxiety and depression: Stable -Continue Lexapro  Hypothyroidism: TSH normal. -Continue Synthroid.   Previous tongue cancer s/p chemoradiation in 2015 -Outpatient follow-up  Thrombocytosis: Likely acute reactant from recent infection.  Completed antibiotic course for MSSA pneumonia.  She has no fever, cardiopulmonary, GI or UTI symptoms.  Smear with unremarkable morphology.  Platelet function assay normal.  Normal spleen on ultrasound.  -Discussed hematology/oncology, Dr. Candise Che.  He agrees that this is likely an acute reactant from recent  infection and takes time to improve -Continue low-dose aspirin  Mild leukocytosis: Looks like chronic.  Severe protein calorie malnutrition Body mass index is 21.45 kg/m. 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 (Active)  12/25/22 2123  Location: Coccyx  Location Orientation: Mid  Staging: Stage 2 -  Partial thickness loss of dermis presenting as a shallow open injury with a red, pink wound bed without slough.  Wound Description (Comments):   Present on Admission: No  Dressing Type Foam - Lift dressing to assess site every shift 12/30/22 0807   DVT prophylaxis:  enoxaparin (LOVENOX) injection 40 mg Start: 12/17/22 0915  Code Status: DNR/DNI Family Communication: None at bedside. Level of care: Telemetry Medical Status is: Inpatient Remains inpatient appropriate because: Dysphagia   Final disposition: Likely home with home health. Consultants:  Pulmonology admitted patient Neurology Hematology/oncology over the phone  35 minutes with more than 50% spent in reviewing records, counseling patient/family and coordinating care.   Sch Meds:  Scheduled Meds:  arformoterol  15 mcg Nebulization BID   aspirin  81 mg Oral Daily   atorvastatin  40 mg Oral QPM   budesonide (PULMICORT) nebulizer solution  0.5 mg Nebulization BID   Chlorhexidine Gluconate  Cloth  6 each Topical Daily   docusate  100 mg Oral QHS   enoxaparin (LOVENOX) injection  40 mg Subcutaneous Q24H   escitalopram  20 mg Oral Daily   feeding supplement (PROSource TF20)  60 mL Per Tube Daily   fiber supplement (BANATROL TF)  60 mL Per Tube BID   folic  acid  1 mg Oral Daily   guaiFENesin  15 mL Oral Q6H   levETIRAcetam  1,000 mg Oral BID   levothyroxine  100 mcg Oral Q0600   lidocaine  2 patch Transdermal Q24H   metoprolol tartrate  12.5 mg Oral BID   midodrine  5 mg Oral TID WC   multivitamin with minerals  1 tablet Oral Daily   nicotine  14 mg Transdermal Daily   polyethylene glycol  17 g Oral QHS   revefenacin  175 mcg Nebulization Daily   sodium chloride flush  10-40 mL Intracatheter Q12H   thiamine  100 mg Per Tube Daily   Continuous Infusions:  sodium chloride Stopped (12/15/22 0859)   feeding supplement (OSMOLITE 1.5 CAL) 1,000 mL (12/29/22 1755)   lactated ringers 75 mL/hr at 12/30/22 0753   PRN Meds:.acetaminophen, bisacodyl, haloperidol lactate, levalbuterol **AND** ipratropium, mouth rinse, sodium chloride flush  Antimicrobials: Anti-infectives (From admission, onward)    Start     Dose/Rate Route Frequency Ordered Stop   12/20/22 1600  ceFAZolin (ANCEF) IVPB 2g/100 mL premix        2 g 200 mL/hr over 30 Minutes Intravenous Every 8 hours 12/20/22 1517 12/25/22 2341   12/19/22 1700  ceFAZolin (ANCEF) IVPB 2g/100 mL premix  Status:  Discontinued        2 g 200 mL/hr over 30 Minutes Intravenous Every 12 hours 12/19/22 1342 12/20/22 1517   12/17/22 0600  piperacillin-tazobactam (ZOSYN) IVPB 3.375 g  Status:  Discontinued        3.375 g 12.5 mL/hr over 240 Minutes Intravenous Every 8 hours 12/17/22 0449 12/19/22 1342        I have personally reviewed the following labs and images: CBC: Recent Labs  Lab 12/27/22 0427 12/28/22 0320 12/29/22 0550 12/30/22 0500 12/30/22 0630  WBC 12.8* 13.1* 12.2* 11.7* 12.4*  NEUTROABS  --   --  9.3*  --  9.0*  HGB 11.6* 12.0 11.5* 11.5* 11.4*  HCT 35.4* 36.5 34.5* 35.1* 34.3*  MCV 102.9* 102.2* 100.6* 102.0* 101.5*  PLT 712* 854* 885* 957* 968*   BMP &GFR Recent Labs  Lab 12/24/22 1256 12/25/22 1115 12/26/22 0142 12/27/22 0427 12/28/22 0320 12/30/22 0500  NA 140   139 137 138 134* 133* 136  K 4.1  4.0 4.3 3.7 3.8 4.2 4.2  CL 93*  93* 96* 101 101 100 99  CO2 30  29 27 27 23  21* 23  GLUCOSE 177*  200* 175* 96 88 84 74  BUN 34*  34* 37* 28* 22 18 19   CREATININE 1.22*  1.31* 1.39* 1.00 0.94 0.68 0.79  CALCIUM 9.3  9.1 8.9 8.3* 8.7* 8.8* 9.5  MG 2.6* 2.7*  --  2.2 2.1 2.1  PHOS 4.4  4.4 3.7  --  2.9 2.8 4.2   Estimated Creatinine Clearance: 53.6 mL/min (by C-G formula based on SCr of 0.79 mg/dL). Liver & Pancreas: Recent Labs  Lab 12/24/22 1256 12/25/22 1115 12/26/22 0142 12/27/22 0427 12/28/22 0320 12/30/22 0500 12/30/22 0630  AST 115* 128* 66*  --  39  --  40  ALT 47* 45* 24  --  25  --  40  ALKPHOS 185* 212* 160*  --  182*  --  153*  BILITOT 0.4 0.3 0.3  --  0.7  --  0.3  PROT 7.4 7.4 6.4*  --  7.0  --  7.2  ALBUMIN 2.2*  2.2* 2.3* 2.4* 2.5* 2.6* 2.7* 2.6*   No results for input(s): "LIPASE", "AMYLASE" in the last 168 hours. No results for input(s): "AMMONIA" in the last 168 hours. Diabetic: No results for input(s): "HGBA1C" in the last 72 hours. Recent Labs  Lab 12/28/22 2342 12/29/22 0614 12/29/22 2348 12/30/22 0633 12/30/22 1120  GLUCAP 90 95 105* 97 111*   Cardiac Enzymes: Recent Labs  Lab 12/24/22 1315 12/25/22 1115  CKTOTAL 41 36*   No results for input(s): "PROBNP" in the last 8760 hours. Coagulation Profile: No results for input(s): "INR", "PROTIME" in the last 168 hours. Thyroid Function Tests: No results for input(s): "TSH", "T4TOTAL", "FREET4", "T3FREE", "THYROIDAB" in the last 72 hours. Lipid Profile: No results for input(s): "CHOL", "HDL", "LDLCALC", "TRIG", "CHOLHDL", "LDLDIRECT" in the last 72 hours. Anemia Panel: Recent Labs    12/30/22 0630  FERRITIN 148   Urine analysis: No results found for: "COLORURINE", "APPEARANCEUR", "LABSPEC", "PHURINE", "GLUCOSEU", "HGBUR", "BILIRUBINUR", "KETONESUR", "PROTEINUR", "UROBILINOGEN", "NITRITE", "LEUKOCYTESUR" Sepsis Labs: Invalid input(s):  "PROCALCITONIN", "LACTICIDVEN"  Microbiology: No results found for this or any previous visit (from the past 240 hour(s)).   Radiology Studies: US SPLEEN (ABDOMEN LIMITED)  Result Date: 12/30/2022 CLINICAL DATA:  Thrombocytosis EXAM: ULTRASOUND ABDOMEN LIMITED COMPARISON:  None Available. FINDINGS: Dedicated imaging of the left upper quadrant demonstrates spleen measuring 8.8 x 7.8 x 3.9 cm, splenic volume = 139.5 cc. IMPRESSION: Spleen is normal in size. Electronically Signed   By: Agustin Cree M.D.   On: 12/30/2022 09:04      Jawara Latorre T. Clara Herbison [facility]  If 7PM-7AM, please contact night-coverage [email or web address] 12/30/2022, 12:23 PM

## 2022-12-30 NOTE — Progress Notes (Signed)
Speech Language Pathology Treatment: Dysphagia  Patient Details Name: Krystal Peterson MRN: [id number] DOB: 1956-02-01 Today's Date: 12/30/2022 Time: 4132-4401 SLP Time Calculation (min) (ACUTE ONLY): 12 min  Assessment / Plan / Recommendation Clinical Impression  Reinforced result of MBS - un sensed residue with solids and unsensed aspiration of thin liquids. Pt reports she has eaten some at each meal. SLP reviewed strategies posted in room and pt return demonstrated successfully. Will f/u for further reinforcement.    HPI HPI: 67 year old lady,  had some nausea and vomiting and diarrhea 12/12/22 followng eating bad mushrooms for few days and therefore quit drinking alcohol which she normally does on a regular basis.  Then husband witnessed seizure by the husband with a left gaze.  Tonic-clonic generalized.   Husband did CPR on advise by EMS for 15 min though he felt she did not lose pulse. Had 1 more episode of seizure on arrival via EMS.  In the ER unresponsive and intubated 8/4-8/5 then 8/6-8/12.   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 PCA cryptogenic infarct on the left side January 2024 (Zio patch without atrial fibrillation) on antiplatelet therapy, ongoing smoking, intermittent UTI periodically on Cipro      SLP Plan  Continue with current plan of care      Recommendations for follow up therapy are one component of a multi-disciplinary discharge planning process, led by the attending physician.  Recommendations may be updated based on patient status, additional functional criteria and insurance authorization.    Recommendations  Diet recommendations: Dysphagia 1 (puree);Nectar-thick liquid Liquids provided via: Cup;Straw Medication Administration:  Crushed with puree Supervision: Patient able to self feed Compensations: Follow solids with liquid;Clear throat intermittently;Multiple dry swallows after each bite/sip Postural Changes and/or Swallow Maneuvers: Seated upright 90 degrees                        Dysphagia, oropharyngeal phase (R13.12)     Continue with current plan of care     Krystal Peterson, Krystal Peterson  12/30/2022, 1:30 PM

## 2022-12-30 NOTE — Progress Notes (Addendum)
   Patient's nurse reported that lab check this morning showed platelet has been trended up to 957.  Per chart review on presentation 12/24/2022 patient's platelet count was 598 which has been progressively trended up until today 957.  WBC 11.7 and RBC 3.44 WNL. Per chart review patient has been admitted for septic shock in the setting of MSSA pneumonia.  There is a possibility that patient has persistent and worsening thrombocytosis in the setting of acute infection.  Due to persistent elevation of platelet count patient has been started on low-dose aspirin 81 mg daily.  As most recent platelet is 957 and patient blood pressure is borderline soft I have ordered some LR 250 bolus and started to continue maintenance fluid 75 cc/h for 1 day.  Patient is already on DVT prophylaxis with Lovenox 40 mg daily. -Checking platelet function assay/bleeding time.  Checking CBC twice daily.  Need to check peripheral blood smear.  Checking liver function panel, ferritin level,.  Checking LQU for splenomegaly.  -Please reach out to on-call hematology in the a.m. for further evaluation and management of worsening thrombocytosis.   Tereasa Coop, MD [facility] 12/30/2022, 6:03 AM

## 2022-12-30 NOTE — Progress Notes (Signed)
Occupational Therapy Treatment Patient Details Name: Krystal Peterson MRN: [id number] DOB: 1955-06-30 Today's Date: 12/30/2022   History of present illness Pt is 67 year old presented to [facility] on  12/14/22 for with stroke like symptoms after a seizure. Had been off ETOH due to n/v. Pt with severe sepsis with septic shock MSSA PNA. Intubated on admission. Extubated 8/5. Reintubated 8/6. Extubated 8/12.  PMH - CVA, ICD, depression, HTN, etoh use disorder, tongue CA, RA   OT comments  Patient received in supine and easily awakened. Patient continues to demonstrate good gains with OT treatment with CGA for bed mobility and standing at sink for self care. Patient often required verbal cues to stay on tasks or would perseverate on grooming tasks. Patient will benefit from continued inpatient follow up therapy, <3 hours/day to continue to address bathing, dressing, and toilet transfers. Acute OT to continue to follow.       If plan is discharge home, recommend the following:  Assistance with cooking/housework;Assistance with feeding;Direct supervision/assist for medications management;Direct supervision/assist for financial management;Assist for transportation;Help with stairs or ramp for entrance;Supervision due to cognitive status;A little help with walking and/or transfers;A little help with bathing/dressing/bathroom   Equipment Recommendations  Other (comment) (defer)    Recommendations for Other Services      Precautions / Restrictions Precautions Precautions: Fall Precaution Comments: cortrak Restrictions Weight Bearing Restrictions: No       Mobility Bed Mobility Overal bed mobility: Needs Assistance Bed Mobility: Supine to Sit     Supine to sit: Contact guard     General bed mobility comments: CGA when scooting to EOB    Transfers Overall transfer level: Needs assistance Equipment used: Rolling walker (2 wheels) Transfers: Sit to/from Stand Sit to Stand: Contact guard  assist           General transfer comment: cues for hand placement and CGA for safety     Balance Overall balance assessment: Needs assistance Sitting-balance support: Feet supported, No upper extremity supported Sitting balance-Leahy Scale: Fair     Standing balance support: Single extremity supported, Bilateral upper extremity supported, During functional activity Standing balance-Leahy Scale: Poor Standing balance comment: single extremity support while standing at sink for self care tasks                           ADL either performed or assessed with clinical judgement   ADL Overall ADL's : Needs assistance/impaired Eating/Feeding: Set up   Grooming: Wash/dry hands;Wash/dry face;Oral care;Brushing hair;Contact guard assist;Standing   Upper Body Bathing: Contact guard assist;Standing                                  Extremity/Trunk Assessment              Vision       Perception     Praxis      Cognition Arousal: Alert Behavior During Therapy: WFL for tasks assessed/performed Overall Cognitive Status: No family/caregiver present to determine baseline cognitive functioning                                 General Comments: difficulty recalling date, cues to stay on tasks        Exercises      Shoulder Instructions       General Comments  Pertinent Vitals/ Pain       Pain Assessment Pain Assessment: Faces Faces Pain Scale: No hurt Pain Intervention(s): Monitored during session  Home Living                                          Prior Functioning/Environment              Frequency  Min 1X/week        Progress Toward Goals  OT Goals(current goals can now be found in the care plan section)  Progress towards OT goals: Progressing toward goals  Acute Rehab OT Goals Patient Stated Goal: get better OT Goal Formulation: With patient Time For Goal Achievement:  01/05/23 Potential to Achieve Goals: Good ADL Goals Pt Will Perform Grooming: with min assist;sitting Pt Will Perform Upper Body Dressing: sitting;with min assist Pt Will Perform Lower Body Dressing: with mod assist;sit to/from stand Pt Will Transfer to Toilet: with mod assist;stand pivot transfer;bedside commode Additional ADL Goal #1: Pt will complete bed mobility with min A as a precursor to ADLs  Plan      Co-evaluation                 AM-PAC OT "6 Clicks" Daily Activity     Outcome Measure   Help from another person eating meals?: None Help from another person taking care of personal grooming?: A Little Help from another person toileting, which includes using toliet, bedpan, or urinal?: A Lot Help from another person bathing (including washing, rinsing, drying)?: A Lot Help from another person to put on and taking off regular upper body clothing?: A Little Help from another person to put on and taking off regular lower body clothing?: A Lot 6 Click Score: 16    End of Session Equipment Utilized During Treatment: Gait belt;Rolling walker (2 wheels)  OT Visit Diagnosis: Unsteadiness on feet (R26.81);Other abnormalities of gait and mobility (R26.89);Muscle weakness (generalized) (M62.81)   Activity Tolerance Patient tolerated treatment well   Patient Left in chair;with call bell/phone within reach;with chair alarm set   Nurse Communication Mobility status        Time: 0935-1000 OT Time Calculation (min): 25 min  Charges: OT General Charges $OT Visit: 1 Visit OT Treatments $Self Care/Home Management : 23-37 mins  Alfonse Flavors, OTA Acute Rehabilitation Services  Office [phone]   Dewain Penning 12/30/2022, 1:12 PM

## 2022-12-31 DIAGNOSIS — F17210 Nicotine dependence, cigarettes, uncomplicated: Secondary | ICD-10-CM

## 2022-12-31 DIAGNOSIS — D75839 Thrombocytosis, unspecified: Secondary | ICD-10-CM

## 2022-12-31 DIAGNOSIS — R569 Unspecified convulsions: Secondary | ICD-10-CM | POA: Diagnosis not present

## 2022-12-31 DIAGNOSIS — R4182 Altered mental status, unspecified: Secondary | ICD-10-CM

## 2022-12-31 DIAGNOSIS — F1090 Alcohol use, unspecified, uncomplicated: Secondary | ICD-10-CM

## 2022-12-31 LAB — COMPREHENSIVE METABOLIC PANEL
ALT: 43 U/L (ref 0–44)
AST: 45 U/L — ABNORMAL HIGH (ref 15–41)
Albumin: 2.5 g/dL — ABNORMAL LOW (ref 3.5–5.0)
Alkaline Phosphatase: 131 U/L — ABNORMAL HIGH (ref 38–126)
Anion gap: 12 (ref 5–15)
BUN: 18 mg/dL (ref 8–23)
CO2: 27 mmol/L (ref 22–32)
Calcium: 9.3 mg/dL (ref 8.9–10.3)
Chloride: 100 mmol/L (ref 98–111)
Creatinine, Ser: 0.74 mg/dL (ref 0.44–1.00)
GFR, Estimated: >60 mL/min (ref >60)
Glucose, Bld: 91 mg/dL (ref 70–99)
Potassium: 3.9 mmol/L (ref 3.5–5.1)
Sodium: 139 mmol/L (ref 135–145)
Total Bilirubin: 0.2 mg/dL — ABNORMAL LOW (ref 0.3–1.2)
Total Protein: 6.8 g/dL (ref 6.5–8.1)

## 2022-12-31 LAB — CBC
HCT: 32.6 % — ABNORMAL LOW (ref 36.0–46.0)
Hemoglobin: 10.9 g/dL — ABNORMAL LOW (ref 12.0–15.0)
MCH: 34.5 pg — ABNORMAL HIGH (ref 26.0–34.0)
MCHC: 33.4 g/dL (ref 30.0–36.0)
MCV: 103.2 fL — ABNORMAL HIGH (ref 80.0–100.0)
Platelets: 946 10*3/uL (ref 150–400)
RBC: 3.16 MIL/uL — ABNORMAL LOW (ref 3.87–5.11)
RDW: 14.6 % (ref 11.5–15.5)
WBC: 9.5 10*3/uL (ref 4.0–10.5)
nRBC: 0 % (ref 0.0–0.2)

## 2022-12-31 LAB — GLUCOSE, CAPILLARY
Glucose-Capillary: 107 mg/dL — ABNORMAL HIGH (ref 70–99)
Glucose-Capillary: 81 mg/dL (ref 70–99)
Glucose-Capillary: 92 mg/dL (ref 70–99)
Glucose-Capillary: 92 mg/dL (ref 70–99)

## 2022-12-31 LAB — PHOSPHORUS: Phosphorus: 4 mg/dL (ref 2.5–4.6)

## 2022-12-31 LAB — MAGNESIUM: Magnesium: 2 mg/dL (ref 1.7–2.4)

## 2022-12-31 MED ORDER — MIDAZOLAM HCL 2 MG/2ML IJ SOLN
INTRAMUSCULAR | Status: AC
  Administered 2022-12-31: 2.5 mg via INTRAVENOUS
  Filled 2022-12-31: qty 4

## 2022-12-31 MED ORDER — MIDAZOLAM HCL 2 MG/2ML IJ SOLN: 2.5000 mg | Freq: Once | INTRAMUSCULAR | Status: AC | PRN

## 2022-12-31 NOTE — Progress Notes (Signed)
Physical Therapy Treatment Patient Details Name: Krystal Peterson MRN: [id number] DOB: 23-Jun-1955 Today's Date: 12/31/2022   History of Present Illness Pt is 67 year old presented to [facility] on  12/14/22 for with stroke like symptoms after a seizure. Had been off ETOH due to n/v. Pt with severe sepsis with septic shock MSSA PNA. Intubated on admission. Extubated 8/5. Reintubated 8/6. Extubated 8/12.  PMH - CVA, ICD, depression, HTN, etoh use disorder, tongue CA, RA    PT Comments  Continues to progress well towards Acute PT goals. States family has arranged 24/7 assist (husband will be present and son is going to stay with them for a couple of weeks.) They are not present to confirm during our session. Goals updated based on new POC. She was able to safely complete stair training similar to home set-up. Still needs CGA for safe gait with RW. A couple episodes of staggering but corrects with RW. Needs cues to identify objects and avoid drifting into hazards. Patient will continue to benefit from skilled physical therapy services to further improve independence with functional mobility.     If plan is discharge home, recommend the following: Assist for transportation;Direct supervision/assist for medications management;Direct supervision/assist for financial management;Assistance with cooking/housework;A little help with walking and/or transfers;A little help with bathing/dressing/bathroom;Help with stairs or ramp for entrance;Supervision due to cognitive status   Can travel by private vehicle     Yes  Equipment Recommendations  Rolling walker (2 wheels)    Recommendations for Other Services       Precautions / Restrictions Precautions Precautions: Fall Precaution Comments: cortrak Restrictions Weight Bearing Restrictions: No     Mobility  Bed Mobility Overal bed mobility: Needs Assistance Bed Mobility: Supine to Sit, Sit to Supine     Supine to sit: Supervision Sit to supine:  Supervision   General bed mobility comments: Requires VC only, supervision for safety. Stable on EOB.    Transfers Overall transfer level: Needs assistance Equipment used: Rolling walker (2 wheels) Transfers: Sit to/from Stand Sit to Stand: Contact guard assist           General transfer comment: CGA for safety, cues for RW set-up and use, hand placement. Does not recall from prior session. No overt LOB.    Ambulation/Gait Ambulation/Gait assistance: Contact guard assist Gait Distance (Feet): 160 Feet Assistive device: Rolling walker (2 wheels) Gait Pattern/deviations: Step-through pattern, Decreased stride length, Drifts right/left, Scissoring, Staggering left Gait velocity: decr Gait velocity interpretation: <1.8 ft/sec, indicate of risk for recurrent falls   General Gait Details: CGA for safety. VC for awareness and to scan for hazards/objects in room and hallway. Will drift into items unless cued. Worked on scanning and identifying objects in hall. Minor instability with a couple episodes of staggering towards Lt with scissoring but overall maintaining a better amount of RW control today and less drift.   Stairs Stairs: Yes Stairs assistance: Contact guard assist Stair Management: One rail Left, Step to pattern, Sideways, Forwards Number of Stairs: 5 (x2) General stair comments: Practiced navigating steps with set-up similar to home environment. Single rail with one hand, and again with bil hands forward and sideways respectively. VC for sequencing, no LOB noted.   Wheelchair Mobility     Tilt Bed    Modified Rankin (Stroke Patients Only)       Balance Overall balance assessment: Needs assistance Sitting-balance support: Feet supported, No upper extremity supported Sitting balance-Leahy Scale: Fair Sitting balance - Comments: Sits EOB without LOB  Standing balance support: Single extremity supported, During functional activity Standing balance-Leahy Scale:  Poor Standing balance comment: single extremity support while standing                            Cognition Arousal: Alert Behavior During Therapy: WFL for tasks assessed/performed Overall Cognitive Status: No family/caregiver present to determine baseline cognitive functioning                                 General Comments: Decreased awareness of deficits        Exercises      General Comments        Pertinent Vitals/Pain Pain Assessment Pain Assessment: No/denies pain Pain Intervention(s): Monitored during session    Home Living                          Prior Function            PT Goals (current goals can now be found in the care plan section) Acute Rehab PT Goals Patient Stated Goal: Feel better PT Goal Formulation: Patient unable to participate in goal setting Time For Goal Achievement: 01/05/23 Potential to Achieve Goals: Good Progress towards PT goals: Progressing toward goals    Frequency    Min 1X/week      PT Plan      Co-evaluation              AM-PAC PT "6 Clicks" Mobility   Outcome Measure  Help needed turning from your back to your side while in a flat bed without using bedrails?: None Help needed moving from lying on your back to sitting on the side of a flat bed without using bedrails?: A Little Help needed moving to and from a bed to a chair (including a wheelchair)?: A Little Help needed standing up from a chair using your arms (e.g., wheelchair or bedside chair)?: A Little Help needed to walk in hospital room?: A Little Help needed climbing 3-5 steps with a railing? : A Little 6 Click Score: 19    End of Session Equipment Utilized During Treatment: Gait belt Activity Tolerance: Patient tolerated treatment well Patient left: with call bell/phone within reach;in bed;with bed alarm set Nurse Communication: Mobility status PT Visit Diagnosis: Unsteadiness on feet (R26.81);Other abnormalities  of gait and mobility (R26.89);Muscle weakness (generalized) (M62.81)     Time: 1324-4010 PT Time Calculation (min) (ACUTE ONLY): 16 min  Charges:    $Gait Training: 8-22 mins PT General Charges $$ ACUTE PT VISIT: 1 Visit                     Kathlyn Sacramento, PT, DPT [facility]  Rehabilitation Services Physical Therapist Office: [phone] Website: [email or web address]    Berton Mount 12/31/2022, 4:20 PM

## 2022-12-31 NOTE — TOC Progression Note (Signed)
Transition of Care [facility]) - Progression Note    Patient Details  Name: Krystal Peterson MRN: [id number] Date of Birth: [birth year]  Transition of Care [facility]) CM/SW Contact  Baldemar Lenis, [place] Phone Number: 12/31/2022, 3:25 PM  Clinical Narrative:   CSW met with patient to discuss recommendation for SNF placement. Patient refused, wants to return back home. Patient in agreement to home health, will need a RW prior to discharge. CSW to follow.    Expected Discharge Plan: Home w Home Health Services Barriers to Discharge: Continued Medical Work up  Expected Discharge Plan and Services                                               Social Determinants of Health (SDOH) Interventions SDOH Screenings   Food Insecurity: No Food Insecurity (12/25/2022)  Housing: Low Risk  (12/25/2022)  Transportation Needs: No Transportation Needs (12/25/2022)  Utilities: Not At Risk (12/25/2022)  Tobacco Use: High Risk (12/25/2022)    Readmission Risk Interventions    12/15/2022    3:43 PM  Readmission Risk Prevention Plan  Post Dischage Appt Complete  Medication Screening Complete  Transportation Screening Complete

## 2022-12-31 NOTE — Progress Notes (Signed)
Speech Language Pathology Treatment: Dysphagia  Patient Details Name: Krystal Peterson MRN: [id number] DOB: [name] 10, 1956 Today's Date: 12/31/2022 Time: 4098-1191 SLP Time Calculation (min) (ACUTE ONLY): 14 min  Assessment / Plan / Recommendation Clinical Impression  Notified by RN that pt does not like her thick liquids and puree diet. Pt states she dislikes the puree more than the nectar thick liquids. MBS results reviewed with pt and reasoning for recommendations. She was unable to recall strategy for the super supraglottic swallow (breath hold, swallow then cough) and needed max cues to follow during session. Throat clears immediate and delayed after swallows of nectar that did not appear due to following strategy. She masticated Dys 3 texture with additional time, cues to continue masticating and at times a liquid wash but was able to masticate without significantly decreasing her endurance and clear oral cavity. Sister on phone and SLP spoke with her answering questions related to swallow function. Recommend she continue nectar thick liquids (pt in agreement) and texture upgraded to Dys 2. Recommend continue strategy of breath hold, swallow then cough however she needs cues to do this.    HPI HPI: 67 year old lady,  had some nausea and vomiting and diarrhea 12/12/22 followng eating bad mushrooms for few days and therefore quit drinking alcohol which she normally does on a regular basis.  Then husband witnessed seizure by the husband with a left gaze.  Tonic-clonic generalized.   Husband did CPR on advise by EMS for 15 min though he felt she did not lose pulse. Had 1 more episode of seizure on arrival via EMS.  In the ER unresponsive and intubated 8/4-8/5 then 8/6-8/12.   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 PCA cryptogenic infarct on the left side January 2024 (Zio patch without atrial fibrillation) on antiplatelet therapy, ongoing smoking, intermittent UTI periodically on Cipro      SLP Plan  Continue with current plan of care      Recommendations for follow up therapy are one component of a multi-disciplinary discharge planning process, led by the attending physician.  Recommendations may be updated based on patient status, additional functional criteria and insurance authorization.    Recommendations  Diet recommendations: Dysphagia 2 (fine chop);Nectar-thick liquid Liquids provided via: Cup;Straw Medication Administration: Crushed with puree Supervision: Patient able to self feed Compensations: Follow solids with liquid;Clear throat intermittently;Multiple dry swallows after each bite/sip                  Oral care QID   Frequent or constant Supervision/Assistance Dysphagia, oropharyngeal phase (R13.12)     Continue with current plan of care     Royce Macadamia  12/31/2022, 11:28 AM

## 2022-12-31 NOTE — Progress Notes (Signed)
Patient has become agitated, is trying to pull her tube and lines, and stating that she is going home tonight. She is unable to communicate a specific concern or complaint that we can address. Her husband is aware and does not want her to leave AMA. Plan to give a dose of Versed and continue non-pharmacologic measures for agitation.

## 2022-12-31 NOTE — Progress Notes (Addendum)
PROGRESS NOTE  Krystal Peterson NFA:[id number] DOB: [birth year]   PCP: Pediatrics, [place]  Patient is from: Home.  Lives with husband.  DOA: 12/13/2022 LOS: 17  Brief Narrative / Interim history: 67 year old F with PMH of V-fib arrest in 2015 s/p ICD, prolonged QT, cryptogenic PCA CVA, SCC of tongue s/p radiation in 2015, prior alcohol use, rheumatoid arthritis, HTN, HLD, hypothyroidism, depression and tobacco use disorder presenting with nausea, vomiting, diarrhea, left gaze and tonic-clonic seizure, and admitted to ICU for seizure.  She was started on Keppra.  Apparently husband did CPR for 15 minutes per recommendation by EMS though he fell she did not lose pulse.  On arrival to ED, she was unresponsive and intubated and admitted to ICU.  Patient was extubated on 8/5 but remained agitated and reintubated.  Hospital course complicated by septic shock due to MSSA pneumonia, polymorphic VT x 2 on 8/7 aborted by AICD shock and prolonged agitation requiring mechanical ventilation and sedation.  Eventually, she came off vasopressors and sedation.  She was extubated on 8/12 and transferred [facility] on 8/14 on 10 L by [facility]NC,  tube feed via cortrack, and IV Ancef for MSSA pneumonia.Marland Kitchen    Respiratory failure resolved.  was n.p.o. on TF.  Per SLP, dysphagia likely chronic.  Advanced to dysphagia 1 diet on 8/19.    Subjective: Reviewed notes from Dr. Alanda Slim last week, per RN no events overnight , did not sleep much last night, tolerating dysphagia 1 diet, now on nocturnal tube feeds -Patient denies any complaints this morning  Objective: Vitals:   12/30/22 2027 12/30/22 2315 12/31/22 0310 12/31/22 0500  BP:  115/79 104/76   Pulse:  86 90   Resp:      Temp:  97.7 F (36.5 C) 98.2 F (36.8 C)   TempSrc:  Oral Oral   SpO2: 99% 100% 96%   Weight:    54.9 kg  Height:        Examination:  GENERAL: Frail chronically ill female laying in bed HEENT: Core track with  tube feeds infusing CVS: S1-S2, regular rhythm Lungs: Decreased breath sounds at the bases Abdomen: Soft, nontender, bowel sounds present Extremities: No edema Neuro: Moves all extremities, no localizing signs,   Procedures:  8/4-8/12 intubation and mechanical ventilation  Microbiology summarized: 8/4-MRSA PCR screen negative 8/7 and 8/10-respiratory culture with Staph aureus resistant to erythromycin, clindamycin  Assessment and plan:  Acute toxic/septic encephalopathy: In the setting of seizure, and EtOH withdrawal, further complicated by ICU delirium and sepsis due to MSSA bacteremia:  -CT head without acute finding.   -Mental status has improved -Reorientation and delirium precaution -Minimize or avoid sedating medications -Increase activity, PT OT  Severe sepsis with septic shock due to MSSA pneumonia: Not POA.  -Completed antibiotic course with IV Ancef on 8/15 -Pulmonary toilet, wean off O2  Seizure likely due to alcohol withdrawal: EEG without active seizure but evidence of potential epileptogenicity arising from the right temporal region and moderate diffuse encephalopathy. -Continue Keppra 1 g twice daily per neurology  EtOH withdrawal with agitation and seizure: Currently no withdrawal symptoms. Completed phenobarbital taper -Continue multivitamin, folic acid and thiamine  Polymorphic VT likely due to hypokalemia, hypomagnesemia: Aborted by ICD shock while in ICU. Prior history of VF Arrest 2015 s/p ICD placement, prolonged QT syndrome -Echo noted normal EF, normal RV, no wall motion abnormality and no significant valvular heart disease -Optimize electrolytes, minimize QT prolonging meds -K3.9 and mag 2.0 today   Acute hypoxemic/hypercapnic  respiratory failure requiring mechanical ventilation in the setting of MSSA pneumonia, sepsis, mucous plugging and seizure:  TTE without significant finding.  Appears euvolemic on exam.  IV Lasix discontinued on 8/14 due to soft  blood pressure.  Respiratory failure resolved. -Aspiration precaution, incentive telemetry, OOB/PT/OT -Continue scheduled and as needed nebulizers -Pulmonary toilet   Hypotension/history of hypertension: TSH and cortisol normal.  Recent TTE without significant finding.resolving. -Continue midodrine. -Continue low-dose metoprolol with parameters for tachycardia and history of VT. -Blood pressure appears to be stabilizing  Dysphagia: Per SLP, likely chronic.  She has a history of tongue cancer and chemoradiation in 2015.   -SLP advanced to dysphagia 1 diet on 8/19.  Tube feeds changed to nocturnal -per SLp, advance as tolerated  Tobacco dependence -Encouraged smoking cessation when able to comprehend -Continue nicotine patch  Physical deconditioning in the setting of acute illness -OOB and PT/OT   Prior history of stroke: CT head without acute finding.  Not able to obtain MRI due to AICD.  No focal neurodeficit. -Continue Lipitor and aspirin.  Right parietal cyst,  -follows with NSGY  Anxiety and depression: Stable -Continue Lexapro  Hypothyroidism: TSH normal. -Continue Synthroid.   Previous tongue cancer s/p chemoradiation in 2015 -Outpatient follow-up  Thrombocytosis: Likely acute reactant from recent infection.  Completed antibiotic course for MSSA pneumonia.  She has no fever, cardiopulmonary, GI or UTI symptoms.  Smear with unremarkable morphology.  Platelet function assay normal.  Normal spleen on ultrasound.  -Dr.Gonfa d/w hematology/oncology, Dr. Candise Che.  He also agrees that this is likely an acute reactant from recent infection and takes time to improve -Continue low-dose aspirin  Mild leukocytosis: Looks like chronic.  Severe protein calorie malnutrition Body mass index is 22.22 kg/m. 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 (Active)  12/25/22 2123  Location: Coccyx  Location Orientation: Mid  Staging: Stage 2 -  Partial thickness loss of dermis presenting as a shallow open injury with a red, pink wound bed without slough.  Wound Description (Comments):   Present on Admission: No  Dressing Type Foam - Lift dressing to assess site every shift 12/30/22 1930   DVT prophylaxis:  enoxaparin (LOVENOX) injection 40 mg Start: 12/17/22 0915  Code Status: DNR/DNI Family Communication: None at bedside. Called and updated spouse Molly Maduro Remains inpatient appropriate because: Dysphagia Final disposition: SNF  Consultants:  Pulmonology admitted patient Neurology Hematology/oncology over the phone  35 minutes with more than 50% spent in reviewing records, counseling patient/family and coordinating care.   Sch Meds:  Scheduled Meds:  arformoterol  15 mcg Nebulization BID   aspirin  81 mg Oral Daily   atorvastatin  40 mg Oral QPM   budesonide (PULMICORT) nebulizer solution  0.5 mg Nebulization BID   Chlorhexidine Gluconate Cloth  6 each Topical Daily   docusate  100 mg Oral QHS   enoxaparin (LOVENOX) injection  40 mg Subcutaneous Q24H   escitalopram  20 mg Oral Daily   feeding supplement (PROSource TF20)  60 mL Per Tube Daily   fiber supplement (BANATROL TF)  60 mL Per Tube BID   folic acid  1 mg Oral Daily   guaiFENesin  15 mL Oral Q6H  levETIRAcetam  1,000 mg Oral BID   levothyroxine  100 mcg Oral Q0600   lidocaine  2 patch Transdermal Q24H   metoprolol tartrate  12.5 mg Oral BID   midodrine  5 mg Oral TID WC   multivitamin with minerals  1 tablet Oral Daily   nicotine  14 mg Transdermal Daily   polyethylene glycol  17 g Oral QHS   revefenacin  175 mcg Nebulization Daily    sodium chloride flush  10-40 mL Intracatheter Q12H   thiamine  100 mg Per Tube Daily   Continuous Infusions:  sodium chloride Stopped (12/15/22 0859)   feeding supplement (OSMOLITE 1.5 CAL) Stopped (12/31/22 0601)   PRN Meds:.acetaminophen, bisacodyl, levalbuterol **AND** ipratropium, mouth rinse, sodium chloride flush  Antimicrobials: Anti-infectives (From admission, onward)    Start     Dose/Rate Route Frequency Ordered Stop   12/20/22 1600  ceFAZolin (ANCEF) IVPB 2g/100 mL premix        2 g 200 mL/hr over 30 Minutes Intravenous Every 8 hours 12/20/22 1517 12/25/22 2341   12/19/22 1700  ceFAZolin (ANCEF) IVPB 2g/100 mL premix  Status:  Discontinued        2 g 200 mL/hr over 30 Minutes Intravenous Every 12 hours 12/19/22 1342 12/20/22 1517   12/17/22 0600  piperacillin-tazobactam (ZOSYN) IVPB 3.375 g  Status:  Discontinued        3.375 g 12.5 mL/hr over 240 Minutes Intravenous Every 8 hours 12/17/22 0449 12/19/22 1342        I have personally reviewed the following labs and images: CBC: Recent Labs  Lab 12/28/22 0320 12/29/22 0550 12/30/22 0500 12/30/22 0630 12/31/22 0546  WBC 13.1* 12.2* 11.7* 12.4* 9.5  NEUTROABS  --  9.3*  --  9.0*  --   HGB 12.0 11.5* 11.5* 11.4* 10.9*  HCT 36.5 34.5* 35.1* 34.3* 32.6*  MCV 102.2* 100.6* 102.0* 101.5* 103.2*  PLT 854* 885* 957* 968* 946*   BMP &GFR Recent Labs  Lab 12/25/22 1115 12/26/22 0142 12/27/22 0427 12/28/22 0320 12/30/22 0500 12/31/22 0546  NA 137 138 134* 133* 136 139  K 4.3 3.7 3.8 4.2 4.2 3.9  CL 96* 101 101 100 99 100  CO2 27 27 23  21* 23 27  GLUCOSE 175* 96 88 84 74 91  BUN 37* 28* 22 18 19 18   CREATININE 1.39* 1.00 0.94 0.68 0.79 0.74  CALCIUM 8.9 8.3* 8.7* 8.8* 9.5 9.3  MG 2.7*  --  2.2 2.1 2.1 2.0  PHOS 3.7  --  2.9 2.8 4.2 4.0   Estimated Creatinine Clearance: 53.6 mL/min (by C-G formula based on SCr of 0.74 mg/dL). Liver & Pancreas: Recent Labs  Lab 12/26/22 0142 12/27/22 0427 12/28/22 0320  12/30/22 0500 12/30/22 0630 12/30/22 1428 12/31/22 0546  AST 66*  --  39  --  40 33 45*  ALT 24  --  25  --  40 35 43  ALKPHOS 160*  --  182*  --  153* 144* 131*  BILITOT 0.3  --  0.7  --  0.3 0.2* 0.2*  PROT 6.4*  --  7.0  --  7.2 6.4* 6.8  ALBUMIN 2.4*   < > 2.6* 2.7* 2.6* 2.4* 2.5*   < > = values in this interval not displayed.   No results for input(s): "LIPASE", "AMYLASE" in the last 168 hours. No results for input(s): "AMMONIA" in the last 168 hours. Diabetic: No results for input(s): "HGBA1C" in the last 72 hours. Recent Labs  Lab  12/30/22 0633 12/30/22 1120 12/30/22 1710 12/30/22 2318 12/31/22 0632  GLUCAP 97 111* 88 90 107*   Cardiac Enzymes: Recent Labs  Lab 12/24/22 1315 12/25/22 1115  CKTOTAL 41 36*   No results for input(s): "PROBNP" in the last 8760 hours. Coagulation Profile: No results for input(s): "INR", "PROTIME" in the last 168 hours. Thyroid Function Tests: No results for input(s): "TSH", "T4TOTAL", "FREET4", "T3FREE", "THYROIDAB" in the last 72 hours. Lipid Profile: No results for input(s): "CHOL", "HDL", "LDLCALC", "TRIG", "CHOLHDL", "LDLDIRECT" in the last 72 hours. Anemia Panel: Recent Labs    12/30/22 0630  FERRITIN 148   Urine analysis: No results found for: "COLORURINE", "APPEARANCEUR", "LABSPEC", "PHURINE", "GLUCOSEU", "HGBUR", "BILIRUBINUR", "KETONESUR", "PROTEINUR", "UROBILINOGEN", "NITRITE", "LEUKOCYTESUR" Sepsis Labs: Invalid input(s): "PROCALCITONIN", "LACTICIDVEN"  Microbiology: No results found for this or any previous visit (from the past 240 hour(s)).   Radiology Studies: No results found.    Zannie Cove MD [facility]  If 7PM-7AM, please contact night-coverage [email or web address] 12/31/2022, 7:22 AM

## 2023-01-01 DIAGNOSIS — R569 Unspecified convulsions: Secondary | ICD-10-CM | POA: Diagnosis not present

## 2023-01-01 DIAGNOSIS — D75839 Thrombocytosis, unspecified: Secondary | ICD-10-CM

## 2023-01-01 LAB — CBC
HCT: 32.6 % — ABNORMAL LOW (ref 36.0–46.0)
Hemoglobin: 10.9 g/dL — ABNORMAL LOW (ref 12.0–15.0)
MCH: 34.5 pg — ABNORMAL HIGH (ref 26.0–34.0)
MCHC: 33.4 g/dL (ref 30.0–36.0)
MCV: 103.2 fL — ABNORMAL HIGH (ref 80.0–100.0)
Platelets: 898 10*3/uL — ABNORMAL HIGH (ref 150–400)
RBC: 3.16 MIL/uL — ABNORMAL LOW (ref 3.87–5.11)
RDW: 14.5 % (ref 11.5–15.5)
WBC: 8.2 10*3/uL (ref 4.0–10.5)
nRBC: 0 % (ref 0.0–0.2)

## 2023-01-01 LAB — GLUCOSE, CAPILLARY
Glucose-Capillary: 106 mg/dL — ABNORMAL HIGH (ref 70–99)
Glucose-Capillary: 108 mg/dL — ABNORMAL HIGH (ref 70–99)
Glucose-Capillary: 119 mg/dL — ABNORMAL HIGH (ref 70–99)

## 2023-01-01 LAB — BASIC METABOLIC PANEL
Anion gap: 8 (ref 5–15)
BUN: 19 mg/dL (ref 8–23)
CO2: 26 mmol/L (ref 22–32)
Calcium: 9 mg/dL (ref 8.9–10.3)
Chloride: 101 mmol/L (ref 98–111)
Creatinine, Ser: 0.87 mg/dL (ref 0.44–1.00)
GFR, Estimated: >60 mL/min (ref >60)
Glucose, Bld: 107 mg/dL — ABNORMAL HIGH (ref 70–99)
Potassium: 3.8 mmol/L (ref 3.5–5.1)
Sodium: 135 mmol/L (ref 135–145)

## 2023-01-01 NOTE — Progress Notes (Signed)
PROGRESS NOTE    Krystal Peterson  YQI:[id number]  DOB: 19-Dec-1955  DOA: 12/13/2022 PCP: Pediatrics, [facility]:   Hospital course: 67 year old F with PMH of V-fib arrest in 2015 s/p ICD, prolonged QT, cryptogenic PCA CVA, SCC of tongue s/p radiation in 2015, prior alcohol use, rheumatoid arthritis, HTN, HLD, hypothyroidism, depression and tobacco use disorder presenting with nausea, vomiting, diarrhea, left gaze and tonic-clonic seizure, and admitted to ICU for seizure.  She was started on Keppra.  Apparently husband did CPR for 15 minutes per recommendation by EMS though he fell she did not lose pulse.  On arrival to ED, she was unresponsive and intubated and admitted to ICU.   Patient was extubated on 8/5 but remained agitated and reintubated.  Hospital course complicated by septic shock due to MSSA pneumonia, polymorphic VT x 2 on 8/7 aborted by AICD shock and prolonged agitation requiring mechanical ventilation and sedation.  Eventually, she came off vasopressors and sedation.  She was extubated on 8/12 and transferred to [facility] on 8/14 on 10 L by [facility],  tube feed via cortrack, and IV Ancef for MSSA pneumonia..     Subjective:  Patient states she feels fine.  Does not remember feeling agitated yesterday initially she thought she was in [place] however when I told her she was in [place] she said she knew that.  Patient states she lives with her husband in their home.  She does know that it is 2024 and knows that the president is Biden.  Objective: Vitals:   01/01/23 0737 01/01/23 0802 01/01/23 1215 01/01/23 1544  BP: 117/78  104/77 103/86  Pulse: 99 99 98 92  Resp: 16 16 18 18   Temp: 98.5 F (36.9 C)  98 F (36.7 C) 98.4 F (36.9 C)  TempSrc: Oral  Oral Oral  SpO2: 96% 96% 94% 99%  Weight:      Height:        Intake/Output Summary (Last 24 hours) at 01/01/2023 1804 Last data filed at 01/01/2023 1554 Gross  per 24 hour  Intake 1487.17 ml  Output 200 ml  Net 1287.17 ml   Filed Weights   12/30/22 0500 12/31/22 0500 01/01/23 0328  Weight: 53 kg 54.9 kg 55.4 kg     Exam:  General: Spry bright-eyed patient sitting up in bed eating breakfast without any difficulty Eyes: sclera anicteric, conjuctiva mild injection bilaterally CVS: S1-S2, regular  Respiratory:  decreased air entry bilaterally secondary to decreased inspiratory effort, rales at bases  GI: NABS, soft, NT  LE: Warm and well-perfused Neuro: As noted above, patient is awake and alert and speaking coherently and logically.  She initially thought that she was in [place] but on redirection understood that she was in [place].  She knows that it is 2024 and that Jackquline Bosch is the president.  Subsequent conversation was normal   Data Reviewed:  Basic Metabolic Panel: Recent Labs  Lab 12/27/22 0427 12/28/22 0320 12/30/22 0500 12/31/22 0546 01/01/23 0545  NA 134* 133* 136 139 135  K 3.8 4.2 4.2 3.9 3.8  CL 101 100 99 100 101  CO2 23 21* 23 27 26   GLUCOSE 88 84 74 91 107*  BUN 22 18 19 18 19   CREATININE 0.94 0.68 0.79 0.74 0.87  CALCIUM 8.7* 8.8* 9.5 9.3 9.0  MG 2.2 2.1 2.1 2.0  --   PHOS 2.9 2.8 4.2 4.0  --     CBC: Recent Labs  Lab 12/29/22 0550 12/30/22  0500 12/30/22 0630 12/31/22 0546 01/01/23 0545  WBC 12.2* 11.7* 12.4* 9.5 8.2  NEUTROABS 9.3*  --  9.0*  --   --   HGB 11.5* 11.5* 11.4* 10.9* 10.9*  HCT 34.5* 35.1* 34.3* 32.6* 32.6*  MCV 100.6* 102.0* 101.5* 103.2* 103.2*  PLT 885* 957* 968* 946* 898*     Scheduled Meds:  arformoterol  15 mcg Nebulization BID   aspirin  81 mg Oral Daily   atorvastatin  40 mg Oral QPM   budesonide (PULMICORT) nebulizer solution  0.5 mg Nebulization BID   Chlorhexidine Gluconate Cloth  6 each Topical Daily   docusate  100 mg Oral QHS   enoxaparin (LOVENOX) injection  40 mg Subcutaneous Q24H   escitalopram  20 mg Oral Daily   feeding supplement (PROSource  TF20)  60 mL Per Tube Daily   fiber supplement (BANATROL TF)  60 mL Per Tube BID   folic acid  1 mg Oral Daily   guaiFENesin  15 mL Oral Q6H   levETIRAcetam  1,000 mg Oral BID   levothyroxine  100 mcg Oral Q0600   lidocaine  2 patch Transdermal Q24H   metoprolol tartrate  12.5 mg Oral BID   midodrine  5 mg Oral TID WC   multivitamin with minerals  1 tablet Oral Daily   nicotine  14 mg Transdermal Daily   polyethylene glycol  17 g Oral QHS   revefenacin  175 mcg Nebulization Daily   sodium chloride flush  10-40 mL Intracatheter Q12H   thiamine  100 mg Per Tube Daily   Continuous Infusions:  sodium chloride Stopped (12/15/22 0859)   feeding supplement (OSMOLITE 1.5 CAL) 1,000 mL (01/01/23 1742)     Assessment & Plan:   Metabolic encephalopathy This seems to have resolved, patient is awake and alert She seems to have had some sundowning last night and although she initially thought she was in [place] she was quickly redirected, she is aware it is 2024 and that Jackquline Bosch is president. Thought to be secondary to alcohol withdrawal, seizure, sepsis from MSSA and sepsis from MSSA and ICU delirium  MSSA pneumonia Acute hypoxic respiratory failure Completed antibiotics with cefazolin on 815 She has been weaned off O2 Sepsis physiology has resolved  Dysphagia Seen by SLP 8/show tongue cancer and chemoradiation 2015 On dysphagia 1 diet and nocturnal tube feeds  Polymorphic VT Thought to be secondary to electrolyte abnormalities Potassium and magnesium have been repleted and normalized Echo with normal EF, no segmental wall motion abnormalities  Alcohol withdrawal seizure and Dts DTs have resolved Continue Keppra for seizure Continue MVI, folic acid and thiamine   The copied from previous notes/Not addressed today: Hypotension/history of hypertension: TSH and cortisol normal.  Recent TTE without significant finding.resolving. -Continue midodrine. -Continue low-dose metoprolol  with parameters for tachycardia and history of VT. -Blood pressure appears to be stabilizing.  Prior history of stroke: CT head without acute finding.  Not able to obtain MRI due to AICD.  No focal neurodeficit. -Continue Lipitor and aspirin.   Right parietal cyst,  -follows with NSGY   Anxiety and depression: Stable -Continue Lexapro   Hypothyroidism: TSH normal. -Continue Synthroid.   Previous tongue cancer s/p chemoradiation in 2015 -Outpatient follow-up   Thrombocytosis: Likely acute reactant from recent infection.  Completed antibiotic course for MSSA pneumonia.  She has no fever, cardiopulmonary, GI or UTI symptoms.  Smear with unremarkable morphology.  Platelet function assay normal.  Normal spleen on ultrasound.  -Dr.Gonfa d/w hematology/oncology, Dr.  Kale.  He also agrees that this is likely an acute reactant from recent infection and takes time to improve -Continue low-dose aspirin   Mild leukocytosis: Looks like chronic.   Severe protein calorie malnutrition Body mass index is 22.22 kg/m. 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  Principal Problem:   Seizure (HCC) Active Problems:   Protein-calorie malnutrition, severe   Acute respiratory failure with hypoxia (HCC)   Status epilepticus (HCC)   Alcohol withdrawal seizure with complication (HCC)   Thrombocytosis     Braylynn Ghan Orma Flaming, [facility]  If 7PM-7AM, please contact night-coverage [email or web address]   LOS: 18 days

## 2023-01-01 NOTE — Progress Notes (Signed)
Occupational Therapy Treatment Patient Details Name: Krystal Peterson MRN: [id number] DOB: May 16, 1955 Today's Date: 01/01/2023   History of present illness Pt is 67 year old [facility] for with stroke like symptoms after a seizure. Had been off ETOH due to n/v. Pt with severe sepsis with septic shock MSSA PNA. Intubated on admission. Extubated 8/5. Reintubated 8/6. Extubated 8/12.  PMH - CVA, ICD, depression, HTN, etoh use disorder, tongue CA, RA   OT comments  Pt making steady progress towards OT goals. Pt able to manage ADLs standing at sink and LB ADLs without physical assistance. Pt does require hands on assist for manuevering DME when mobilizing out on hallway due to tendency to drift L vs R. Pt benefits from intermittent cues for safety and some memory deficits noted. Pt moving well enough physically to DC home if family able to provide assist for IADLs (med mgmt, cooking) d/t current cognition.       If plan is discharge home, recommend the following:  Assistance with cooking/housework;Direct supervision/assist for medications management;Direct supervision/assist for financial management;Assist for transportation;Help with stairs or ramp for entrance;Supervision due to cognitive status;A little help with walking and/or transfers   Equipment Recommendations  Other (comment) (RW vs Rollator pending education)    Recommendations for Other Services      Precautions / Restrictions Precautions Precautions: Fall Precaution Comments: cortrak Restrictions Weight Bearing Restrictions: No       Mobility Bed Mobility Overal bed mobility: Modified Independent Bed Mobility: Supine to Sit, Sit to Supine                Transfers Overall transfer level: Needs assistance Equipment used: Rolling walker (2 wheels) Transfers: Sit to/from Stand Sit to Stand: Supervision                 Balance Overall balance assessment: Needs assistance Sitting-balance  support: No upper extremity supported, Feet supported Sitting balance-Leahy Scale: Good     Standing balance support: No upper extremity supported, Bilateral upper extremity supported, During functional activity Standing balance-Leahy Scale: Fair                             ADL either performed or assessed with clinical judgement   ADL Overall ADL's : Needs assistance/impaired     Grooming: Supervision/safety;Standing;Wash/dry face;Oral care Grooming Details (indicate cue type and reason): also able to stand > 7 min while OT assisted with removing knots from hair             Lower Body Dressing: Supervision/safety;Bed level Lower Body Dressing Details (indicate cue type and reason): cues for attention to task, able to bring LE to self in bed             Functional mobility during ADLs: Minimal assistance;Rolling walker (2 wheels);Cueing for sequencing;Cueing for safety General ADL Comments: some sway when walking around unit requiring constant hands on assist for RW navigation    Extremity/Trunk Assessment Upper Extremity Assessment Upper Extremity Assessment: Generalized weakness;LUE deficits/detail LUE Deficits / Details: some questionable deficits around elbow, from prior CVA? impaired coordination when reaching for items and lifting this UE LUE Coordination: decreased fine motor;decreased gross motor   Lower Extremity Assessment Lower Extremity Assessment: Defer to PT evaluation        Vision   Vision Assessment?: Vision impaired- to be further tested in functional context   Perception     Praxis  Cognition Arousal: Alert Behavior During Therapy: WFL for tasks assessed/performed Overall Cognitive Status: No family/caregiver present to determine baseline cognitive functioning                                 General Comments: decreased safety awareness, slower processing requiring increased time for orientation questions. some  memory deficits as pt reports husband can assist at home but that he also works - was unsure what type of work he does        Building services engineer Comments      Pertinent Vitals/ Pain       Pain Assessment Pain Assessment: No/denies pain  Home Living                                          Prior Functioning/Environment              Frequency  Min 1X/week        Progress Toward Goals  OT Goals(current goals can now be found in the care plan section)  Progress towards OT goals: Progressing toward goals  Acute Rehab OT Goals Patient Stated Goal: home soon, get cortrak out OT Goal Formulation: With patient Time For Goal Achievement: 01/05/23 Potential to Achieve Goals: Good ADL Goals Pt Will Perform Grooming: with min assist;sitting Pt Will Perform Upper Body Dressing: sitting;with min assist Pt Will Perform Lower Body Dressing: with mod assist;sit to/from stand Pt Will Transfer to Toilet: with mod assist;stand pivot transfer;bedside commode Additional ADL Goal #1: Pt will complete bed mobility with min A as a precursor to ADLs  Plan      Co-evaluation                 AM-PAC OT "6 Clicks" Daily Activity     Outcome Measure   Help from another person eating meals?: None Help from another person taking care of personal grooming?: A Little Help from another person toileting, which includes using toliet, bedpan, or urinal?: A Little Help from another person bathing (including washing, rinsing, drying)?: A Little Help from another person to put on and taking off regular upper body clothing?: A Little Help from another person to put on and taking off regular lower body clothing?: A Little 6 Click Score: 19    End of Session Equipment Utilized During Treatment: Gait belt;Rolling walker (2 wheels)  OT Visit Diagnosis: Unsteadiness on feet (R26.81);Other abnormalities of gait and mobility (R26.89);Muscle  weakness (generalized) (M62.81)   Activity Tolerance Patient tolerated treatment well   Patient Left in bed;with call bell/phone within reach;with bed alarm set   Nurse Communication Mobility status        Time: 7829-5621 OT Time Calculation (min): 24 min  Charges: OT General Charges $OT Visit: 1 Visit OT Treatments $Self Care/Home Management : 23-37 mins  Bradd Canary, OTR/L Acute Rehab Services Office: [phone]   Lorre Munroe 01/01/2023, 12:52 PM

## 2023-01-01 NOTE — Plan of Care (Signed)
  Problem: Education: Goal: Knowledge of General Education information will improve Description: Including pain rating scale, medication(s)/side effects and non-pharmacologic comfort measures Outcome: Progressing   Problem: Health Behavior/Discharge Planning: Goal: Ability to manage health-related needs will improve Outcome: Progressing   Problem: Clinical Measurements: Goal: Ability to maintain clinical measurements within normal limits will improve Outcome: Progressing Goal: Will remain free from infection Outcome: Progressing Goal: Diagnostic test results will improve Outcome: Progressing Goal: Respiratory complications will improve Outcome: Progressing Goal: Cardiovascular complication will be avoided Outcome: Progressing   Problem: Activity: Goal: Risk for activity intolerance will decrease Outcome: Progressing   Problem: Nutrition: Goal: Adequate nutrition will be maintained Outcome: Progressing   Problem: Coping: Goal: Level of anxiety will decrease Outcome: Progressing   Problem: Elimination: Goal: Will not experience complications related to bowel motility Outcome: Progressing Goal: Will not experience complications related to urinary retention Outcome: Progressing   Problem: Pain Managment: Goal: General experience of comfort will improve Outcome: Progressing   Problem: Safety: Goal: Ability to remain free from injury will improve Outcome: Progressing   Problem: Skin Integrity: Goal: Risk for impaired skin integrity will decrease Outcome: Progressing   Problem: Safety: Goal: Non-violent Restraint(s) Outcome: Progressing   Problem: Activity: Goal: Ability to tolerate increased activity will improve Outcome: Progressing   Problem: Respiratory: Goal: Ability to maintain a clear airway and adequate ventilation will improve Outcome: Progressing   Problem: Role Relationship: Goal: Method of communication will improve Outcome: Progressing   

## 2023-01-01 NOTE — Plan of Care (Signed)
  Problem: Education: Goal: Knowledge of General Education information will improve Description Including pain rating scale, medication(s)/side effects and non-pharmacologic comfort measures Outcome: Progressing   

## 2023-01-01 NOTE — Progress Notes (Addendum)
Marland Kitchen   HEMATOLOGY/ONCOLOGY CONSULTATION NOTE  Date of Service: 01/01/2023  Patient Care Team: Pediatrics, [place] as PCP - General (Pediatrics)  CHIEF COMPLAINTS/PURPOSE OF CONSULTATION:  thrombocytosis  HISTORY OF PRESENTING ILLNESS:   Krystal Peterson is a wonderful 67 y.o. female who has been referred to [place] by Dr Alanda Slim MD for evaluation and management of thrombocytosis.  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 Patient presented to the hospital on 12/14/2022 tonic-clonic seizures and altered mental status requiring intubation.  Patient was extubated on 8/5 but has remained agitated and required reintubation. She has had a complicated hospital course including having MSSA pneumonia polymorphic ventricular tachycardia, prolonged agitation and altered mental status requiring mechanical ventilation and sedation.  She has finally been extubated from 8/12 and continues to be on tube feeding and antibiotics for her MSSA pneumonia.  Patient had normal platelets on admission and during hospitalization the patient has had progressively increasing with platelet counts with a peak of 968k in this context.  Patient is a poor historian and is still confused and unable to provide much information.  No family at bedside during this visit.  Most of the information obtained from the electronic medical record.   MEDICAL HISTORY:  Past Medical History:  Diagnosis Date   Hypertension    Hypothyroidism    Prolonged Q-T interval on ECG    Stroke (HCC)    Tongue cancer (HCC) 2013   resected & chemo at [facility]- T4N2cM0   VT (ventricular tachycardia)  (HCC)   EtOH and alcohol abuse  SURGICAL HISTORY: History of AICD placement  SOCIAL HISTORY: Social History   Socioeconomic History   Marital status: Married    Spouse name: Not on file   Number of children: Not on file   Years of education: Not on file   Highest education level: Not on file  Occupational History   Not on file  Tobacco Use   Smoking status: Some Days    Types: Cigarettes    Passive exposure: Current   Smokeless tobacco: Never  Substance and Sexual Activity   Alcohol use: Yes    Alcohol/week: 40.0 standard drinks of alcohol    Types: 40 Cans of beer per week   Drug use: Not Currently   Sexual activity: Never  Other Topics Concern   Not on file  Social History Narrative   Not on file   Social Determinants of Health   Financial Resource Strain: Not on file  Food Insecurity: No Food Insecurity (12/25/2022)   Hunger Vital Sign    Worried About Running Out of Food in the Last Year: Never true    Ran Out of Food in the Last Year: Never true  Transportation Needs: No Transportation Needs (12/25/2022)   PRAPARE - Administrator, Civil Service (Medical): No    Lack of Transportation (Non-Medical): No  Physical Activity: Not on file  Stress: Not on file  Social Connections: Not on file  Intimate Partner Violence: Not At Risk (12/25/2022)   Humiliation, Afraid, Rape, and Kick questionnaire    Fear of Current or Ex-Partner: No    Emotionally Abused: No    Physically Abused: No  Sexually Abused: No    FAMILY HISTORY: No family history on file.  ALLERGIES:  has No Known Allergies.  MEDICATIONS:  Current Facility-Administered Medications  Medication Dose Route Frequency Provider Last Rate Last Admin   0.9 %  sodium chloride infusion  250 mL Intravenous Continuous Luciano Cutter, MD   Stopped at 12/15/22 [phone]   acetaminophen (TYLENOL) tablet 650 mg  650 mg Oral Q6H PRN Almon Hercules, MD       arformoterol (BROVANA) nebulizer solution 15  mcg  15 mcg Nebulization BID Selmer Dominion B, NP   15 mcg at 01/01/23 0802   aspirin chewable tablet 81 mg  81 mg Oral Daily Gonfa, Taye T, MD   81 mg at 01/01/23 0926   atorvastatin (LIPITOR) tablet 40 mg  40 mg Oral QPM Candelaria Stagers T, MD   40 mg at 12/31/22 1813   bisacodyl (DULCOLAX) suppository 10 mg  10 mg Rectal Daily PRN Selmer Dominion B, NP       budesonide (PULMICORT) nebulizer solution 0.5 mg  0.5 mg Nebulization BID Selmer Dominion B, NP   0.5 mg at 01/01/23 0803   Chlorhexidine Gluconate Cloth 2 % PADS 6 each  6 each Topical Daily Kalman Shan, MD   6 each at 01/01/23 0926   docusate (COLACE) 50 MG/5ML liquid 100 mg  100 mg Oral QHS Candelaria Stagers T, MD   100 mg at 12/30/22 2252   enoxaparin (LOVENOX) injection 40 mg  40 mg Subcutaneous Q24H Karie Fetch P, DO   40 mg at 01/01/23 0926   escitalopram (LEXAPRO) tablet 20 mg  20 mg Oral Daily Candelaria Stagers T, MD   20 mg at 01/01/23 0925   feeding supplement (OSMOLITE 1.5 CAL) liquid 1,000 mL  1,000 mL Per Tube Continuous Almon Hercules, MD   Stopped at 01/01/23 0627   feeding supplement (PROSource TF20) liquid 60 mL  60 mL Per Tube Daily Karie Fetch P, DO   60 mL at 01/01/23 0926   fiber supplement (BANATROL TF) liquid 60 mL  60 mL Per Tube BID Candelaria Stagers T, MD   60 mL at 01/01/23 0926   folic acid (FOLVITE) tablet 1 mg  1 mg Oral Daily Candelaria Stagers T, MD   1 mg at 01/01/23 0926   guaiFENesin (ROBITUSSIN) 100 MG/5ML liquid 15 mL  15 mL Oral Q6H Gonfa, Taye T, MD   15 mL at 01/01/23 0558   levalbuterol (XOPENEX) nebulizer solution 0.63 mg  0.63 mg Nebulization Q6H PRN Gonfa, Taye T, MD       And   ipratropium (ATROVENT) nebulizer solution 0.5 mg  0.5 mg Nebulization Q6H PRN Candelaria Stagers T, MD       levETIRAcetam (KEPPRA) tablet 1,000 mg  1,000 mg Oral BID Candelaria Stagers T, MD   1,000 mg at 01/01/23 0925   levothyroxine (SYNTHROID) tablet 100 mcg  100 mcg Oral Q0600 Candelaria Stagers T, MD   100 mcg at 01/01/23 0559   lidocaine (LIDODERM) 5 % 2 patch   2 patch Transdermal Q24H Charlott Holler, MD   2 patch at 12/31/22 1249   metoprolol tartrate (LOPRESSOR) tablet 12.5 mg  12.5 mg Oral BID Candelaria Stagers T, MD   12.5 mg at 01/01/23 0925   midodrine (PROAMATINE) tablet 5 mg  5 mg Oral TID WC Candelaria Stagers T, MD   5 mg at 01/01/23 6213   multivitamin with minerals tablet 1 tablet  1 tablet Oral Daily Candelaria Stagers  T, MD   1 tablet at 01/01/23 0925   nicotine (NICODERM CQ - dosed in mg/24 hours) patch 14 mg  14 mg Transdermal Daily Cheri Fowler, MD   14 mg at 01/01/23 3244   Oral care mouth rinse  15 mL Mouth Rinse PRN Cheri Fowler, MD       polyethylene glycol (MIRALAX / GLYCOLAX) packet 17 g  17 g Oral QHS Alanda Slim, Taye T, MD   17 g at 12/30/22 2252   revefenacin (YUPELRI) nebulizer solution 175 mcg  175 mcg Nebulization Daily Selmer Dominion B, NP   175 mcg at 01/01/23 0802   sodium chloride flush (NS) 0.9 % injection 10-40 mL  10-40 mL Intracatheter Q12H Chand, Garnet Sierras, MD   10 mL at 01/01/23 0102   sodium chloride flush (NS) 0.9 % injection 10-40 mL  10-40 mL Intracatheter PRN Cheri Fowler, MD       thiamine (VITAMIN B1) tablet 100 mg  100 mg Per Tube Daily Jacklynn Barnacle, RPH   100 mg at 01/01/23 7253    REVIEW OF SYSTEMS:    10 Point review of Systems was done is negative except as noted above.  PHYSICAL EXAMINATION: ECOG PERFORMANCE STATUS: 3 - Symptomatic, >50% confined to bed  . Vitals:   01/01/23 0737 01/01/23 0802  BP: 117/78   Pulse: 99 99  Resp: 16 16  Temp: 98.5 F (36.9 C)   SpO2: 96% 96%   Filed Weights   12/30/22 0500 12/31/22 0500 01/01/23 0328  Weight: 116 lb 13.5 oz (53 kg) 121 lb 0.5 oz (54.9 kg) 122 lb 2.2 oz (55.4 kg)   .Body mass index is 22.42 kg/m.  GENERAL:alert, confused and restless LYMPH:  no palpable lymphadenopathy in the cervical, axillary regions LUNGS: clear to auscultation b/l with normal respiratory effort HEART: regular rate & rhythm with several extrabeats ABDOMEN:  normoactive bowel sounds ,  G-tube in situ Extremity: no pedal edema   LABORATORY DATA:  I have reviewed the data as listed  .    Latest Ref Rng & Units 01/01/2023    5:45 AM 12/31/2022    5:46 AM 12/30/2022    6:30 AM  CBC  WBC 4.0 - 10.5 K/uL 8.2  9.5  12.4   Hemoglobin 12.0 - 15.0 g/dL 66.4  40.3  47.4   Hematocrit 36.0 - 46.0 % 32.6  32.6  34.3   Platelets 150 - 400 K/uL 898  946  968     .    Latest Ref Rng & Units 01/01/2023    5:45 AM 12/31/2022    5:46 AM 12/30/2022    2:28 PM  CMP  Glucose 70 - 99 mg/dL 259  91    BUN 8 - 23 mg/dL 19  18    Creatinine 5.63 - 1.00 mg/dL 8.75  6.43    Sodium 329 - 145 mmol/L 135  139    Potassium 3.5 - 5.1 mmol/L 3.8  3.9    Chloride 98 - 111 mmol/L 101  100    CO2 22 - 32 mmol/L 26  27    Calcium 8.9 - 10.3 mg/dL 9.0  9.3    Total Protein 6.5 - 8.1 g/dL  6.8  6.4   Total Bilirubin 0.3 - 1.2 mg/dL  0.2  0.2   Alkaline Phos 38 - 126 U/L  131  144   AST 15 - 41 U/L  45  33   ALT 0 - 44 U/L  43  35  RADIOGRAPHIC STUDIES: I have personally reviewed the radiological images as listed and agreed with the findings in the report. US SPLEEN (ABDOMEN LIMITED)  Result Date: 12/30/2022 CLINICAL DATA:  Thrombocytosis EXAM: ULTRASOUND ABDOMEN LIMITED COMPARISON:  None Available. FINDINGS: Dedicated imaging of the left upper quadrant demonstrates spleen measuring 8.8 x 7.8 x 3.9 cm, splenic volume = 139.5 cc. IMPRESSION: Spleen is normal in size. Electronically Signed   By: Agustin Cree M.D.   On: 12/30/2022 09:04   DG Swallowing Func-Speech Pathology  Result Date: 12/29/2022 Table formatting from the original result was not included. Modified Barium Swallow Study Patient Details Name: Krystal Peterson MRN: [id number] Date of Birth: Oct 16, 1955 Today's Date: 12/29/2022 HPI/PMH: HPI: 67 year old lady,  had some nausea and vomiting and diarrhea 12/12/22 followng eating bad mushrooms for few days and therefore quit drinking alcohol which she normally does on a regular basis.  Then  husband witnessed seizure by the husband with a left gaze.  Tonic-clonic generalized.   Husband did CPR on advise by EMS for 15 min though he felt she did not lose pulse. Had 1 more episode of seizure on arrival via EMS.  In the ER unresponsive and intubated 8/4-8/5 then 8/6-8/12.   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 PCA cryptogenic infarct on the left side January 2024 (Zio patch without atrial fibrillation) on antiplatelet therapy, ongoing smoking, intermittent UTI periodically on Cipro Clinical Impression: Clinical Impression: Pt demonstrates modest improvement in swallowing though mentation and oral hygiene has significantly improved. Oral residue is now minimal and there is no residue clinging to posterior pharyngeal wall. Pt continues to demosntrate severely deminised hyoid excursion, tongue base retraction and epiglottic deflection impacting bolu propulsion through the upper pharynx. There is still moderate vallecular residue with puree and nectar, but pt is better able to follow strategies to compensate. She has silent aspiration of thin and no sensation or residue, so SLP used videofeedback to demonstrate this. Pt was most successful with a super supraglottic swallow with nectar and puree (pt elevates larynx and sustains vestibular closure independently as likely a subconscious baseline strategy but needs a cue to clear throat and swallow again. Chin tuck ineffective. Head turn with a liquid wash seemed to help clear vallecular residue, but even better was a cued hock and spit, which again pt likely does independently without awareness. Pts swallow may not be any better or worse than her baseline function, but she is potentially more vulnerable and weak. Recommend  puree/nectar with f/u for tolerance. Factors that may increase risk of adverse event in presence of aspiration [name] & [name] 2021): Factors that may increase risk of adverse event in presence of aspiration [name] & [name] 2021): Frail or deconditioned; Weak cough Recommendations/Plan: Swallowing Evaluation Recommendations Swallowing Evaluation Recommendations Recommendations: PO diet PO Diet Recommendation: Dysphagia 1 (Pureed); Mildly thick liquids (Level 2, nectar thick) Liquid Administration via: Cup; Straw Medication Administration: Crushed with puree Supervision: Patient able to self-feed; Intermittent supervision/cueing for swallowing strategies Swallowing strategies  : Follow solids with liquids; Multiple dry swallows after each bite/sip; Clear throat intermittently; Hold breath during swallow, followed by a cough (super supraglottic swallow) Postural changes: Position pt fully upright for meals Oral care recommendations: Oral care BID (2x/day) Treatment Plan Treatment Plan Treatment recommendations: Therapy as outlined in treatment plan  below Follow-up recommendations: Skilled nursing-short term rehab (<3 hours/day) Functional status assessment: Patient has had a recent decline in their functional status and demonstrates the ability to make significant improvements in function in a reasonable and predictable amount of time. Treatment frequency: Min 2x/week Treatment duration: 2 weeks Interventions: Diet toleration management by SLP; Trials of upgraded texture/liquids; Patient/family education; Compensatory techniques Recommendations Recommendations for follow up therapy are one component of a multi-disciplinary discharge planning process, led by the attending physician.  Recommendations may be updated based on patient status, additional functional criteria and insurance authorization. Assessment: Orofacial Exam: No data recorded Anatomy: Anatomy: WFL Boluses Administered: Boluses Administered Boluses  Administered: Thin liquids (Level 0); Mildly thick liquids (Level 2, nectar thick); Puree  Oral Impairment Domain: Oral Impairment Domain Lip Closure: No labial escape Tongue control during bolus hold: Escape to lateral buccal cavity/floor of mouth; Posterior escape of less than half of bolus Bolus transport/lingual motion: Brisk tongue motion Oral residue: Residue collection on oral structures Location of oral residue : Tongue; Palate Initiation of pharyngeal swallow : Posterior laryngeal surface of the epiglottis; Pyriform sinuses  Pharyngeal Impairment Domain: Pharyngeal Impairment Domain Soft palate elevation: No bolus between soft palate (SP)/pharyngeal wall (PW) Laryngeal elevation: Complete superior movement of thyroid cartilage with complete approximation of arytenoids to epiglottic petiole Anterior hyoid excursion: Partial anterior movement Epiglottic movement: No inversion Laryngeal vestibule closure: Incomplete, narrow column air/contrast in laryngeal vestibule Pharyngeal stripping wave : Present - complete Pharyngeal contraction (A/P view only): N/A Pharyngoesophageal segment opening: Complete distension and complete duration, no obstruction of flow Tongue base retraction: Wide column of contrast or air between tongue base and PPW Pharyngeal residue: Complete pharyngeal clearance Location of pharyngeal residue: Tongue base; Valleculae  Esophageal Impairment Domain: No data recorded Pill: No data recorded Penetration/Aspiration Scale Score: Penetration/Aspiration Scale Score 1.  Material does not enter airway: Puree 3.  Material enters airway, remains ABOVE vocal cords and not ejected out: Mildly thick liquids (Level 2, nectar thick) 5.  Material enters airway, CONTACTS cords and not ejected out: Mildly thick liquids (Level 2, nectar thick); Thin liquids (Level 0) 8.  Material enters airway, passes BELOW cords without attempt by patient to eject out (silent aspiration) : Thin liquids (Level 0)  Compensatory Strategies: Compensatory Strategies Compensatory strategies: Yes Straw: Effective Effective Straw: Mildly thick liquid (Level 2, nectar thick) Effortful swallow: Effective Effective Effortful Swallow: Mildly thick liquid (Level 2, nectar thick); Puree Multiple swallows: Effective Effective Multiple Swallows: Mildly thick liquid (Level 2, nectar thick); Puree Chin tuck: Ineffective Ineffective Chin Tuck: Thin liquid (Level 0); Mildly thick liquid (Level 2, nectar thick) Liquid wash: Effective Effective Liquid Wash: Mildly thick liquid (Level 2, nectar thick) Left head turn: Effective Effective Left Head Turn: Mildly thick liquid (Level 2, nectar thick); Puree Right head turn: Ineffective Supraglottic swallow: -- (intuitive) Super supraglottic swallow: Effective Effective Super Supraglottic Swallow: Mildly thick liquid (Level 2, nectar thick); Puree Oral bolus hold: Ineffective Ineffective Oral Bolus Hold : Thin liquid (Level 0); Mildly thick liquid (Level 2, nectar thick)   General Information: Caregiver present: No  Diet Prior to this Study: NPO   Temperature : Normal   Respiratory Status: WFL   No data recorded  History of Recent Intubation: Yes  Behavior/Cognition: Alert; Cooperative; Pleasant mood Self-Feeding Abilities: Needs assist with self-feeding Baseline vocal quality/speech: Normal Volitional Cough: Able to elicit Volitional Swallow: Able to elicit No data recorded Goal Planning: Prognosis for improved oropharyngeal function: Fair Barriers to Reach Goals: Severity of deficits;  Time post onset No data recorded No data recorded Consulted and agree with results and recommendations: Patient; Physician; Nurse Pain: No data recorded End of Session: Start Time:SLP Start Time (ACUTE ONLY): 1100 Stop Time: SLP Stop Time (ACUTE ONLY): 1120 Time Calculation:SLP Time Calculation (min) (ACUTE ONLY): 20 min Charges: SLP Evaluations $ SLP Speech Visit: 1 Visit SLP Evaluations $MBS Swallow: 1 Procedure  $Swallowing Treatment: 1 Procedure SLP visit diagnosis: SLP Visit Diagnosis: Dysphagia, oropharyngeal phase (R13.12) Past Medical History: Past Medical History: Diagnosis Date  Hypertension   Hypothyroidism   Prolonged Q-T interval on ECG   Stroke (HCC)   Tongue cancer (HCC) 2013  resected & chemo at [facility]- T4N2cM0  VT (ventricular tachycardia) (HCC)  [place], [name] 12/29/2022, 12:15 PM   DG Swallowing Func-Speech Pathology  Result Date: 12/25/2022 Table formatting from the original result was not included. Modified Barium Swallow Study Patient Details Name: Krystal Peterson MRN: [id number] Date of Birth: March 23, 1956 Today's Date: 12/25/2022 HPI/PMH: HPI: 67 year old lady,  had some nausea and vomiting and diarrhea 12/12/22 followng eating bad mushrooms for few days and therefore quit drinking alcohol which she normally does on a regular basis.  Then husband witnessed seizure by the husband with a left gaze.  Tonic-clonic generalized.   Husband did CPR on advise by EMS for 15 min though he felt she did not lose pulse. Had 1 more episode of seizure on arrival via EMS.  In the ER unresponsive and intubated 8/4-8/5 then 8/6-8/12.   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 Decreased oral control with posterior escape with thin, impaired and incomplete mastication with expectoration of partial bolus. Thin and nectar penetrated vocal cords with thin aspirating during subsequent swallows. Chin  tuck resulted in greater volume of aspiration. There was no epiglottic deflection with thin, nectar but present with heavier puree bolus, decreased tongue base retraction and pharyngeal stripping resulting in significant vallecular and pyriform sinus residue. Pharyngoesophageal distention was reduced with stasis at the level of the PES. She coughed once throughout the study with decreased sensation. Pt is deconditioned and became obviously fatigued with difficulty initiating subsequent swallows in attemptst to clear residue. Esophageal scan was unremarkable. Recommend pt continue NPO with continued ST for pharyngeal strengthening and time post extubation and time for overall improved strength. She can have supervised ice chips after oral care. Factors that may increase risk of adverse event in presence of aspiration [name] & [name] 2021): Factors that may increase risk of adverse event in presence of aspiration [name] & [name] 2021): Frail or deconditioned; Weak cough Recommendations/Plan: Swallowing Evaluation Recommendations Swallowing Evaluation Recommendations Recommendations: NPO Medication Administration: Via alternative means Oral care recommendations: Oral care QID (4x/day) Treatment Plan Treatment Plan Treatment recommendations: Therapy as outlined in treatment plan below Follow-up recommendations: -- (TBD) Functional status assessment: Patient has had a recent decline in their functional status and demonstrates the ability to make significant improvements in function in a reasonable and predictable amount of time. Treatment frequency: Min 2x/week Treatment duration: 2 weeks Recommendations Recommendations for follow up therapy are one component of a multi-disciplinary  discharge planning process, led by the attending physician.  Recommendations may be updated based on patient status, additional functional criteria and insurance authorization. Assessment: Orofacial Exam: Orofacial Exam Oral Cavity: Oral  Hygiene: Xerostomia Oral Cavity - Dentition: Edentulous (does not wear dentures when eating per husband and sister) Orofacial Anatomy: WFL Oral Motor/Sensory Function: WFL Anatomy: Anatomy: WFL Boluses Administered: Boluses Administered Boluses Administered: Thin liquids (Level 0); Mildly thick liquids (Level 2, nectar thick); Moderately thick liquids (Level 3, honey thick); Puree; Solid  Oral Impairment Domain: Oral Impairment Domain Lip Closure: No labial escape Tongue control during bolus hold: Posterior escape of less than half of bolus Bolus preparation/mastication: Minimal chewing/mashing with majority of bolus unchewed (had to expectorate residue) Bolus transport/lingual motion: Delayed initiation of tongue motion (oral holding) Oral residue: Trace residue lining oral structures Location of oral residue : Tongue; Palate Initiation of pharyngeal swallow : Posterior laryngeal surface of the epiglottis; Pyriform sinuses  Pharyngeal Impairment Domain: Pharyngeal Impairment Domain Soft palate elevation: No bolus between soft palate (SP)/pharyngeal wall (PW) Laryngeal elevation: Partial superior movement of thyroid cartilage/partial approximation of arytenoids to epiglottic petiole Anterior hyoid excursion: Partial anterior movement Epiglottic movement: No inversion (with thin) Laryngeal vestibule closure: Incomplete, narrow column air/contrast in laryngeal vestibule Pharyngeal stripping wave : Present - diminished Pharyngeal contraction (A/P view only): N/A Pharyngoesophageal segment opening: Partial distention/partial duration, partial obstruction of flow Tongue base retraction: Wide column of contrast or air between tongue base and PPW Pharyngeal residue: Collection of residue within or on pharyngeal structures Location of pharyngeal residue: Valleculae; Pyriform sinuses  Esophageal Impairment Domain: Esophageal Impairment Domain Esophageal clearance upright position: Complete clearance, esophageal coating Pill:  No data recorded Penetration/Aspiration Scale Score: Penetration/Aspiration Scale Score 1.  Material does not enter airway: Solid; Puree; Moderately thick liquids (Level 3, honey thick) 5.  Material enters airway, CONTACTS cords and not ejected out: Mildly thick liquids (Level 2, nectar thick) 8.  Material enters airway, passes BELOW cords without attempt by patient to eject out (silent aspiration) : Thin liquids (Level 0) Compensatory Strategies: Compensatory Strategies Compensatory strategies: Yes Straw: Ineffective Ineffective Straw: Mildly thick liquid (Level 2, nectar thick) Multiple swallows: Ineffective Ineffective Multiple Swallows: Mildly thick liquid (Level 2, nectar thick); Moderately thick liquid (Level 3, honey thick) Chin tuck: Ineffective Ineffective Chin Tuck: Thin liquid (Level 0)   General Information: Caregiver present: No  Diet Prior to this Study: NPO; Cortrak/Small bore NG tube   Temperature : Normal   Respiratory Status: WFL   Supplemental O2: High flow nasal cannula   History of Recent Intubation: Yes  Behavior/Cognition: Alert; Cooperative; Pleasant mood Self-Feeding Abilities: Needs assist with self-feeding Baseline vocal quality/speech: Normal Volitional Cough: Able to elicit Volitional Swallow: Able to elicit Exam Limitations: No limitations Goal Planning: Prognosis for improved oropharyngeal function: Good No data recorded No data recorded Patient/Family Stated Goal: -- (will attempt to educate spouse this afternoon) Consulted and agree with results and recommendations: Patient; Nurse; Physician Pain: Pain Assessment Pain Assessment: No/denies pain Faces Pain Scale: 0 Facial Expression: 0 Body Movements: 0 Muscle Tension: 0 Compliance with ventilator (intubated pts.): N/A Vocalization (extubated pts.): 0 CPOT Total: 0 Pain Location: generalized Pain Descriptors / Indicators: Grimacing; Guarding Pain Intervention(s): Monitored during session; Limited activity within patient's tolerance  End of Session: Start Time:SLP Start Time (ACUTE ONLY): 1240 Stop Time: SLP Stop Time (ACUTE ONLY): 1256 Time Calculation:SLP Time Calculation (min) (ACUTE ONLY): 16 min Charges: SLP Evaluations $ SLP Speech Visit: 1 Visit SLP Evaluations $MBS Swallow: 1  Procedure $Swallowing Treatment: 1 Procedure SLP visit diagnosis: SLP Visit Diagnosis: Dysphagia, pharyngoesophageal phase (R13.14); Dysphagia, oral phase (R13.11) Past Medical History: Past Medical History: Diagnosis Date  Hypertension   Hypothyroidism   Prolonged Q-T interval on ECG   Stroke (HCC)   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 Feeding catheter is noted in place. Defibrillator is again noted and stable. Endotracheal tube has been removed. Bibasilar atelectatic changes are noted stable in appearance from the prior exam. Small right pleural effusion is seen. IMPRESSION: Stable bibasilar opacities with small right effusion. Electronically Signed   By: Alcide Clever M.D.   On: 12/23/2022 01:04   [place] EKG SITE RITE  Result Date: 12/21/2022 If Site Rite image not attached, placement could not be confirmed due to current cardiac rhythm.  DG CHEST PORT 1 VIEW  Result Date: 12/20/2022 CLINICAL DATA:  [id number] Endotracheally intubated [id number] EXAM: PORTABLE CHEST 1 VIEW COMPARISON:  Same day radiograph FINDINGS: Endotracheal tube has been retracted, now terminating approximately 2.4 cm above the carina. Enteric tube extends below the diaphragm with distal tip beyond the inferior margin of the film. Stable heart size. Bibasilar opacities, right greater than left. Slightly improving aeration at the right lung base. Probable small bilateral pleural effusions. No pneumothorax.  IMPRESSION: 1. Endotracheal tube has been retracted, now terminating approximately 2.4 cm above the carina. 2. Bibasilar opacities, right greater than left, with slightly improving aeration at the right lung base. 3. Probable small bilateral pleural effusions. Electronically Signed   By: Duanne Guess D.O.   On: 12/20/2022 16:25   DG CHEST PORT 1 VIEW  Addendum Date: 12/20/2022   ADDENDUM REPORT: 12/20/2022 09:53 ADDENDUM: Critical Value/emergent results were called by telephone at the time of interpretation on 12/20/2022 at 9:53 am to provider DR. CHAND, who verbally acknowledged these results. Electronically Signed   By: Delbert Phenix M.D.   On: 12/20/2022 09:53   Result Date: 12/20/2022 CLINICAL DATA:  [id number] Respiratory abnormalities [id number] EXAM: PORTABLE CHEST 1 VIEW COMPARISON:  12/16/2022 chest radiograph. FINDINGS: Endotracheal tube tip is 0.2 cm above the carina. Enteric tube enters stomach with the tip not seen on this image. Stable configuration of single lead left subclavian ICD. Stable cardiomediastinal silhouette with normal heart size. No pneumothorax. Probable new small right pleural effusion. Patchy streaky bibasilar lung opacities have increased, right greater than left. IMPRESSION: 1. Endotracheal tube tip is 0.2 cm above the carina, suggest retracting 2 cm. 2. Increased patchy streaky bibasilar lung opacities, right greater than left, which could represent aspiration, pneumonia and/or atelectasis. Electronically Signed: By: Delbert Phenix M.D. On: 12/20/2022 09:35   DG Abd 1 View  Result Date: 12/20/2022 CLINICAL DATA:  [id number] Abdominal pain [id number] EXAM: ABDOMEN - 1 VIEW COMPARISON:  12/17/2022 abdominal radiograph FINDINGS: Weighted enteric tube tip in the body of the stomach. No dilated small bowel loops. Mild-to-moderate colonic gas. No evidence of pneumatosis or pneumoperitoneum. Lower right renal 17 mm stone is unchanged. Catheter overlies the right deep pelvis of uncertain  etiology. IMPRESSION: 1. Weighted enteric tube tip in the body of the stomach. Nonobstructive bowel gas pattern. 2. Stable right nephrolithiasis. Electronically Signed   By: Delbert Phenix M.D.   On: 12/20/2022 09:37   CT  HEAD WO CONTRAST ( )  Result Date: 12/18/2022 CLINICAL DATA:  67 year old female intubated. Altered mental status. Code stroke presentation on 12/13/2022. EXAM: CT HEAD WITHOUT CONTRAST TECHNIQUE: Contiguous axial images were obtained from the base of the skull through the vertex without intravenous contrast. RADIATION DOSE REDUCTION: This exam was performed according to the departmental dose-optimization program which includes automated exposure control, adjustment of the mA and/or kV according to patient size and/or use of iterative reconstruction technique. COMPARISON:  Head CT 12/13/2022. Prior outside brain MRI 06/26/2022 available on [facility] P[facility] FINDINGS: Brain: Extra-axial appearing CSF fluid density cyst in the posterior right hemisphere appears stable from the February MRI. Regional mass effect (series 3, image 17), but no evidence of cerebral edema there. Contralateral left PCA territory encephalomalacia has developed from the time of the previous MRI when cytotoxic edema was demonstrated in the vascular territory. Ex vacuo enlargement of the left occipital horn. No midline shift or other intracranial mass effect. No ventriculomegaly. No acute intracranial hemorrhage identified. Stable gray-white matter differentiation throughout the brain. No cortically based acute infarct identified. Normal basilar cisterns. Vascular: No suspicious intracranial vascular hyperdensity. Calcified atherosclerosis at the skull base. Skull: Intact. Sinuses/Orbits: Mild to moderate sinus mucosal thickening and opacification with the patient intubated on the scout view. Left nasoenteric tube in place. Tympanic cavities are clear. Mild mastoid opacification. Other: Intubated, fluid in the pharynx. Negative  orbit and scalp soft tissues. IMPRESSION: 1. No acute intracranial abnormality identified, stable non contrast CT appearance of the brain. 2. Chronic Left PCA infarct with encephalomalacia, and contralateral posterior right hemisphere rack noise cyst. 3. Intubated and left nasoenteric tube in place. Electronically Signed   By: Odessa Fleming M.D.   On: 12/18/2022 10:30   DG Abd Portable 1V  Result Date: 12/17/2022 CLINICAL DATA:  67 year old female feeding tube placement. EXAM: PORTABLE ABDOMEN - 1 VIEW COMPARISON:  12/14/2022 and earlier. FINDINGS: Portable AP view at 0928 hours. Enteric feeding tube placed into the left abdomen, and crosses midline at the level of the pelvic inlet, likely within a low lying stomach. Tip is just across midline probably at the level of the gastric antrum. Streaky and confluent new right lung base opacities since 12/14/2022 appears to be a combination of pleural effusion and airspace disease. AICD lead redemonstrated. Left lung base appears to remain negative. Nonobstructed bowel-gas pattern with bulky chronic right nephrolithiasis. IMPRESSION: 1. Enteric feeding tube placed and probably within a low lying stomach, tip at the level of the gastric antrum. 2. New right lung base opacities since 12/14/2022, probably with a small component of pleural effusion. See also portable chest x-ray yesterday. 3. Bulky right nephrolithiasis. Electronically Signed   By: Odessa Fleming M.D.   On: 12/17/2022 09:48   DG CHEST PORT 1 VIEW  Result Date: 12/16/2022 CLINICAL DATA:  ETT placement, hypoxia EXAM: PORTABLE CHEST 1 VIEW COMPARISON:  12/16/2022 at 0830 hours FINDINGS: Endotracheal tube terminates 4 cm above the carina. Multifocal patchy opacities, right lower lobe predominant, suspicious for pneumonia. No pleural effusion or pneumothorax. Mild cardiomegaly.  Left subclavian ICD. Enteric tube courses into the stomach. IMPRESSION: Endotracheal tube terminates 4 cm above the carina. Multifocal pneumonia,  right lower lobe predominant. Electronically Signed   By: Charline Bills M.D.   On: 12/16/2022 23:45   Discontinue Long Term Monitor EEG  Result Date: 12/16/2022 Jefferson Fuel, MD     12/16/2022  5:59 PM Patient Name: Krystal Peterson MRN: [id number] Epilepsy Attending: Jefferson Fuel Referring Physician/Provider:  Dr. Derry Lory Date: 12/16/22 Start time: 8/6 at 0730 End Time: 8/6 at 1153 Duration: 4 hrs 23 min Patient history: 67 year with history of stroke, who is presenting with AMS after a seizure, EEG for further evaluation. Level of alertness: Intubated and sedated AEDs during EEG study: Levetiracetam, phenobarb. Sedated on precedex Technical aspects: This EEG study was done with scalp electrodes positioned according to the 10-20 International system of electrode placement. Electrical activity was reviewed with band pass filter of 1-70Hz , sensitivity of 7 uV/mm, display speed of 80mm/sec with a 60Hz  notched filter applied as appropriate. EEG data were recorded continuously and digitally stored.  Video monitoring was available and reviewed as appropriate. Description: EEG showed continuous generalized polymorphic sharply contoured 3 to 6 Hz theta-delta slowing. The EEG background was continuous and reactive. There were additional right temporal sharps, Sleep pattern was not seen. There were no electrographic seizures this recording. Hyperventilation and photic stimulation were not performed.   ABNORMALITY - Sharp wave, right temporal region - Continuous slow, generalized IMPRESSION: This study showed evidence of potential epileptogenicity arising from the right temporal region and moderate diffuse encephalopathy, nonspecific etiology but likely related to sedation, toxic-metabolic etiology, anoxic/hypoxic brain injury. Jefferson Fuel   DG CHEST PORT 1 VIEW  Result Date: 12/16/2022 CLINICAL DATA:  Hypoxia. EXAM: PORTABLE CHEST 1 VIEW COMPARISON:  December 15, 2022. FINDINGS: Stable cardiomediastinal  silhouette. Single lead left-sided defibrillator is unchanged. Right lung is clear. Increased left infrahilar opacity is noted concerning for possible pneumonia or atelectasis. Bony thorax is unremarkable. IMPRESSION: Increased left infrahilar opacity is noted concerning for possible pneumonia or atelectasis. Continued radiographic follow-up is recommended. Electronically Signed   By: Lupita Raider M.D.   On: 12/16/2022 12:33   ECHOCARDIOGRAM COMPLETE  Result Date: 12/15/2022    ECHOCARDIOGRAM REPORT   Patient Name:   Krystal Peterson Date of Exam: 12/15/2022 Medical Rec #:  [id number]         Height:       62.0 in Accession #:    [id number]        Weight:       124.1 lb Date of Birth:  07-13-1955         BSA:          1.561 m Patient Age:    67 years          BP:           96/61 mmHg Patient Gender: F                 HR:           69 bpm. Exam Location:  Inpatient Procedure: 2D Echo, Cardiac Doppler and Color Doppler Indications:    Cardiomyopathy-Unspecified I42.9  History:        Patient has no prior history of Echocardiogram examinations.  Sonographer:    Lucendia Herrlich Referring Phys: [id number] MURALI RAMASWAMY IMPRESSIONS  1. Left ventricular ejection fraction, by estimation, is 60 to 65%. The left ventricle has normal function. The left ventricle has no regional wall motion abnormalities. Left ventricular diastolic parameters were normal.  2. Right ventricular systolic function is normal. The right ventricular size is normal.  3. The mitral valve is normal in structure. No evidence of mitral valve regurgitation.  4. The aortic valve was not well visualized. Aortic valve regurgitation is trivial.  5. The inferior vena cava is normal in size with greater than 50% respiratory variability, suggesting right atrial pressure of 3  mmHg. Comparison(s): No prior Echocardiogram. FINDINGS  Left Ventricle: Left ventricular ejection fraction, by estimation, is 60 to 65%. The left ventricle has normal function. The left  ventricle has no regional wall motion abnormalities. The left ventricular internal cavity size was normal in size. There is  no left ventricular hypertrophy. Left ventricular diastolic parameters were normal. Right Ventricle: The right ventricular size is normal. Right ventricular systolic function is normal. Left [facility]: Left atrial size was normal in size. Right [facility]: Right atrial size was normal in size. Pericardium: There is no evidence of pericardial effusion. Mitral Valve: The mitral valve is normal in structure. No evidence of mitral valve regurgitation. Tricuspid Valve: Tricuspid valve regurgitation is not demonstrated. Aortic Valve: The aortic valve was not well visualized. Aortic valve regurgitation is trivial. Aortic valve peak gradient measures 10.2 mmHg. Pulmonic Valve: Pulmonic valve regurgitation is not visualized. Aorta: The aortic root and ascending aorta are structurally normal, with no evidence of dilitation. Venous: The inferior vena cava is normal in size with greater than 50% respiratory variability, suggesting right atrial pressure of 3 mmHg. IAS/Shunts: No atrial level shunt detected by color flow Doppler. Additional Comments: A device lead is visualized.  LEFT VENTRICLE PLAX 2D LVIDd:         3.90 cm   Diastology LVIDs:         2.80 cm   LV e' medial:    9.72 cm/s LV PW:         0.70 cm   LV E/e' medial:  7.5 LV IVS:        0.70 cm   LV e' lateral:   10.90 cm/s LVOT diam:     2.10 cm   LV E/e' lateral: 6.7 LV SV:         77 LV SV Index:   49 LVOT Area:     3.46 cm                           3D Volume EF:                          3D EF:        56 %                          LV EDV:       110 ml                          LV ESV:       48 ml                          LV SV:        61 ml RIGHT VENTRICLE             IVC RV S prime:     13.70 cm/s  IVC diam: 1.50 cm TAPSE (M-mode): 2.4 cm LEFT ATRIUM             Index        RIGHT ATRIUM           Index LA diam:        2.50 cm 1.60 cm/m   RA Area:      17.10 cm LA Vol (A2C):   27.8 ml 17.81 ml/m  RA Volume:   43.30  ml  27.74 ml/m LA Vol (A4C):   23.2 ml 14.86 ml/m LA Biplane Vol: 27.0 ml 17.30 ml/m  AORTIC VALVE AV Area (Vmax): 2.42 cm AV Vmax:        160.00 cm/s AV Peak Grad:   10.2 mmHg LVOT Vmax:      112.00 cm/s LVOT Vmean:     71.800 cm/s LVOT VTI:       0.221 m  AORTA Ao Root diam: 3.10 cm Ao Asc diam:  3.30 cm MITRAL VALVE               TRICUSPID VALVE MV Area (PHT): 3.53 cm    TR Peak grad:   15.5 mmHg MV Decel Time: 215 msec    TR Vmax:        197.00 cm/s MR Peak grad: 19.4 mmHg MR Vmax:      220.00 cm/s  SHUNTS MV E velocity: 72.60 cm/s  Systemic VTI:  0.22 m MV A velocity: 96.80 cm/s  Systemic Diam: 2.10 cm MV E/A ratio:  0.75 Photographer signed by Carolan Clines Signature Date/Time: 12/15/2022/9:12:48 AM    Final    DG Chest Port 1 View  Result Date: 12/15/2022 CLINICAL DATA:  Endotracheal tube EXAM: PORTABLE CHEST 1 VIEW COMPARISON:  Yesterday FINDINGS: Endotracheal tube with tip between the clavicular heads and carina. Enteric tube at least reaches the stomach. Defibrillator lead into the right ventricle. Mild streaky density at the left base attributed atelectasis. Normal heart size and mediastinal contours. No effusion or pneumothorax. IMPRESSION: 1. Unremarkable hardware positioning. 2. Increased atelectasis at the left base. Electronically Signed   By: Tiburcio Pea M.D.   On: 12/15/2022 04:58   DG Abd Portable 1V  Result Date: 12/14/2022 CLINICAL DATA:  Feeding tube placement EXAM: PORTABLE ABDOMEN - 1 VIEW COMPARISON:  None Available. FINDINGS: Orogastric or nasogastric tube enters the stomach with its tip in the expected location of the mid body of the stomach. Upper abdominal gas pattern is unremarkable. IMPRESSION: Orogastric or nasogastric tube tip in the mid body of the stomach. Electronically Signed   By: Paulina Fusi M.D.   On: 12/14/2022 10:04   Overnight EEG with video  Result Date: 12/14/2022 Windell Norfolk, MD     12/14/2022  7:56 AM Patient Name: Krystal Peterson MRN: [id number] Epilepsy Attending: Windell Norfolk Referring Physician/Provider: Dr. Derry Lory Date: 12/14/2022 Start time: 8/4 at 0205 End Time: 8/4 at 0730 Duration: 5 hours and 25 minutes Patient history: 45 year with history of stroke, who is presenting with AMS after a seizure, EEG for further evaluation. Level of alertness: Intubated and sedated AEDs during EEG study: Levetiracetam Technical aspects: This EEG study was done with scalp electrodes positioned according to the 10-20 International system of electrode placement. Electrical activity was reviewed with band pass filter of 1-70Hz , sensitivity of 7 uV/mm, display speed of 53mm/sec with a 60Hz  notched filter applied as appropriate. EEG data were recorded continuously and digitally stored.  Video monitoring was available and reviewed as appropriate. Description: EEG showed continuous generalized polymorphic sharply contoured 3 to 6 Hz theta-delta slowing. The EEG background was continuous and reactive. There were additional right temporal sharps, Sleep pattern was not seen.  Hyperventilation and photic stimulation were not performed.   ABNORMALITY -Sharp wave, right temporal region. -Continuous slow, generalized IMPRESSION: This study showed evidence of potential epileptogenicity arising from the right temporal region and moderate diffuse encephalopathy, nonspecific etiology but likely related to sedation, toxic-metabolic etiology, anoxic/hypoxic brain injury  Amadou Camara   CT CHEST WO CONTRAST  Result Date: 12/14/2022 CLINICAL DATA:  Respiratory illness this with no prior imaging and abnormal x-ray. EXAM: CT CHEST WITHOUT CONTRAST TECHNIQUE: Multidetector CT imaging of the chest was performed following the standard protocol without IV contrast. RADIATION DOSE REDUCTION: This exam was performed according to the departmental dose-optimization program which includes automated exposure control,  adjustment of the mA and/or kV according to patient size and/or use of iterative reconstruction technique. COMPARISON:  Chest radiograph from earlier today. FINDINGS: Cardiovascular: Normal heart size. No pericardial effusion. ICD lead into the right ventricle from the left. No acute vascular finding. Scattered atheromatous calcification. Mediastinum/Nodes: Negative for mass or adenopathy. An enteric tube at least reaches the stomach. Lungs/Pleura: An endotracheal tube tip terminates above the carina and below the thoracic inlet. Mild apical emphysema. There is no edema, consolidation, effusion, or pneumothorax. Upper Abdomen: Excreting contrast in the kidneys from recent CTA of the head and neck. Few colonic fluid levels are seen at the splenic flexure. Musculoskeletal: Anterior right second, fourth, and fifth rib fractures with mild displacement of the second rib. Angulation of anterior left third through sixth ribs from nondisplaced fracturing. Chronic T1 compression fracture. No acute spinal fracture or subluxation. IMPRESSION: 1. No evidence of acute cardiopulmonary disease. 2. Bilateral anterior rib fractures with up to mild displacement. No air leak. Electronically Signed   By: Tiburcio Pea M.D.   On: 12/14/2022 05:18   US Abdomen Limited RUQ (LIVER/GB)  Result Date: 12/14/2022 CLINICAL DATA:  Alcoholic. EXAM: ULTRASOUND ABDOMEN LIMITED RIGHT UPPER QUADRANT COMPARISON:  None Available. FINDINGS: Gallbladder: No gallstones or wall thickening visualized. Sonographic Eulah Pont sign could not be assessed. Common bile duct: Diameter: 2.3 mm Liver: No focal lesion identified. The left lobe of the liver could not be visualized due to overlying defibrillator pad. Within normal limits in parenchymal echogenicity. Portal vein is patent on color Doppler imaging with normal direction of blood flow towards the liver. Other: No free fluid. IMPRESSION: 1. No cholelithiasis or acute cholecystitis. 2. Limited evaluation of  the left lobe of the liver. No acute abnormality is seen. Electronically Signed   By: Thornell Sartorius M.D.   On: 12/14/2022 03:56   CT ANGIO HEAD NECK W WO CM W PERF (CODE STROKE)  Result Date: 12/14/2022 CLINICAL DATA:  Initial evaluation for neuro deficit, stroke. EXAM: CT ANGIOGRAPHY HEAD AND NECK CT PERFUSION BRAIN TECHNIQUE: Multidetector CT imaging of the head and neck was performed using the standard protocol during bolus administration of intravenous contrast. Multiplanar CT image reconstructions and MIPs were obtained to evaluate the vascular anatomy. Carotid stenosis measurements (when applicable) are obtained utilizing NASCET criteria, using the distal internal carotid diameter as the denominator. Multiphase CT imaging of the brain was performed following IV bolus contrast injection. Subsequent parametric perfusion maps were calculated using RAPID software. RADIATION DOSE REDUCTION: This exam was performed according to the departmental dose-optimization program which includes automated exposure control, adjustment of the mA and/or kV according to patient size and/or use of iterative reconstruction technique. CONTRAST:  OMNIPAQUE IOHEXOL 350 MG/ML SOLN COMPARISON:  CT from earlier the same day as well as prior exam from 05/28/2022. FINDINGS: CTA NECK FINDINGS Aortic arch: Visualized aortic arch normal caliber standard branch pattern. Atheromatous change about the arch itself. No significant stenosis about the origin the great vessels. Right carotid system: Right common and internal carotid arteries are patent without stenosis or dissection. Left carotid system: Left CCA patent to the bifurcation. 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 Left ICA remains occluded to the skull base. Vertebral arteries: Both vertebral arteries arise from the subclavian arteries. No significant proximal subclavian artery stenosis. Moderate atheromatous stenosis at the origin of  the left vertebral artery which is dominant. Vertebral arteries otherwise patent distally without stenosis or dissection. Skeleton: No discrete or worrisome osseous lesions. Chronic compression deformity involving the T1 vertebral body noted, stable. Patient is edentulous. Other neck: Endotracheal tube in place.  No other acute finding. Upper chest: Emphysema. Left-sided pacemaker/AICD in place. No other acute finding. Review of the MIP images confirms the above findings CTA HEAD FINDINGS Anterior circulation: Right ICA widely patent through the siphon without stenosis. There is distal reconstitution of the left ICA at the left carotid siphon via the ophthalmic artery and flow across the circle-of-Willis. A1 segments patent bilaterally. Normal anterior communicating complex. Anterior cerebral arteries patent without stenosis. No M1 stenosis or occlusion. No proximal MCA branch occlusion or high-grade stenosis. Distal MCA branches perfused and symmetric. Posterior circulation: Both V4 segments widely patent. Left PICA patent. Right PICA not both seen. Basilar patent without stenosis. Superior cerebellar arteries patent bilaterally. Right PCA widely patent to its distal aspect without stenosis. 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 Venous sinuses: Grossly patent allowing for timing of the contrast bolus. Anatomic variants: As above.  No aneurysm. Review of the MIP images confirms the above findings CT Brain Perfusion Findings: ASPECTS: 10 CBF (<30%) Volume: 26mL Perfusion (Tmax>6.0s) volume: 74mL Mismatch Volume: 48mL Infarction Location:Apparent 26 mL area of C BF less than 30% largely corresponds with the right occipital rack noise cyst and overlying occipital convexities, favored to be artifactual. Similarly, 74 mL apparent perfusion abnormality at least in part reflects artifact as well. Some delayed perfusion within the left cerebral hemisphere related to the chronically occluded left  ICA and PCAs is likely contributory. No convincing acute ischemic changes. IMPRESSION: CTA HEAD AND NECK: 1. Negative CTA for large vessel occlusion or other emergent finding. 2. 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 Distal reconstitution at the left carotid siphon via the ophthalmic artery and flow across the circle-of-Willis. 3. Chronic occlusion of the left PCA at the proximal left P2 segment, stable. 4. Moderate atheromatous stenosis at the origin of the dominant left vertebral artery. CT PERFUSION: Negative CT perfusion. Apparent perfusion deficits are felt to be consistent with artifact and/or the chronic left ICA/PCA occlusions as discussed above. Aortic Atherosclerosis (ICD10-I70.0) and Emphysema (ICD10-J43.9). Case discussed by telephone around the time of interpretation on 12/14/2022 at 12:25 a.m. to provider [facility]. Electronically Signed   By: Rise Mu M.D.   On: 12/14/2022 00:47   DG Chest Port 1 View  Result Date: 12/14/2022 CLINICAL DATA:  ET tube EXAM: PORTABLE CHEST 1 VIEW COMPARISON:  05/28/2022 FINDINGS: Endotracheal tube is 5 cm above the carina. NG tube enters the stomach. Left pacer remains in place, unchanged. Heart and mediastinal contours within normal limits. No confluent opacities or effusions. No acute bony abnormality. IMPRESSION: Support devices in expected position as above. No acute cardiopulmonary disease. Electronically Signed   By: Charlett Nose M.D.   On: 12/14/2022 00:29   CT HEAD CODE STROKE WO CONTRAST  Result Date: 12/14/2022 CLINICAL DATA:  Code stroke. EXAM: CT HEAD WITHOUT CONTRAST TECHNIQUE: Contiguous axial images were obtained from the base of the skull through the vertex without intravenous contrast. RADIATION  DOSE REDUCTION: This exam was performed according to the departmental dose-optimization program which includes automated exposure control, adjustment of the mA and/or kV according to patient size and/or  use of iterative reconstruction technique. COMPARISON:  Prior study from 06/26/2022. FINDINGS: Brain: Cerebral volume within normal limits for age. Mild chronic small vessel ischemic disease noted. Chronic left PCA distribution infarct. Benign arachnoid cyst at the posterior right occipital region. No acute intracranial hemorrhage. No acute large vessel territory infarct. No mass lesion or midline shift. No hydrocephalus or extra-axial fluid collection. Vascular: No abnormal hyperdense vessel. Skull: Scalp soft tissues within normal limits.  Calvarium intact. Sinuses/Orbits: Globes and orbital soft tissues within normal limits. Mild pneumatized secretions within the posterior right ethmoidal air cells. Paranasal sinuses are otherwise clear. No significant mastoid effusion. Other: Endotracheal tube in place. ASPECTS [facility] Stroke Program Early CT Score) - Ganglionic level infarction (caudate, lentiform nuclei, internal capsule, insula, M1-M3 cortex): 7 - Supraganglionic infarction (M4-M6 cortex): 3 Total score (0-10 with 10 being normal): 10 IMPRESSION: 1. No acute intracranial abnormality. 2. Aspects is 10. 3. Mild chronic microvascular ischemic disease with chronic left PCA distribution infarct. 4. Posterior right cerebral arachnoid cyst, stable. These results were communicated to Dr. Derry Lory at 12:07 am on 12/14/2022 by text page via the [facility] messaging system. Electronically Signed   By: Rise Mu M.D.   On: 12/14/2022 00:08    ASSESSMENT & PLAN:   67 year old female with history of alcohol and tobacco abuse, previous history of tongue cancer status post chemoradiation admitted with chronic seizures and altered mental status has since had MSSA pneumonia likely vent acquired.  #1 Thrombocytosis  Patient was admitted to the hospital and did not have any thrombocytosis on presentation.  This makes it most likely that the patient's thrombocytosis is reactive related to her multiple inpatient  medical issues including her vent acquired MSSA pneumonia, stress from seizures and agitation and other sources of inflammation and stress. Less likely to be a clonal bone marrow process given her platelet counts were completely normal in the 200-300k range on admission.  #2 MSSA pneumonia-on antibiotics  #3 status post tonic-clonic seizures with altered mental status requiring intubation and mechanical ventilation.  #4 alcohol abuse and tobacco abuse.  #5 history of V-fib arrest status post AICD.  Has had few runs of nonsustained V. tach while hospitalized.  Plan -Patient's case was discussed in detail previously with Dr. Alanda Slim. -Labs and imaging studies reviewed. -The thrombocytosis is likely reactive and secondary to her multiple etiologies causing inflammation as well as an MSSA pneumonia. -Reactive thrombocytosis can take several weeks to a couple of months to resolve after the underlying process has resolved. -Management of pneumonia per hospital medicine.  If persistent or worsening elevation of platelets would want to make sure there is not any developing abscess related to her MSSA or signs of deep tissue infection or infection of the AICD/leads. -We will send out clonal markers including JAK2/CAL reticulin and MPL to rule out clonal process such as essential thrombocytosis. -Follow-up with PCP in 1 to 2 months post discharge to evaluate for resolution of thrombocytosis and follow-up of her clonal markers. -If platelets remain elevated or the clonal markers are positive then the patient will need follow-up in hematology for further evaluation and management. -She would be recommended complete smoking cessation and cessation of alcohol use. -Ultrasound of the spleen was done and showed normal splenic size which also makes the possibility of a primary MPN less likely.  Hematology will  sign off please call if any other questions arise  The total time spent in the appointment was 60  minutes*.  All of the patient's questions were answered with apparent satisfaction. The patient knows to call the clinic with any problems, questions or concerns.   Wyvonnia Lora MD MS AAHIVMS [facility] Hematology/Oncology Physician [facility]  .*Total Encounter Time as defined by the Centers for Medicare and Medicaid Services includes, in addition to the face-to-face time of a patient visit (documented in the note above) non-face-to-face time: obtaining and reviewing outside history, ordering and reviewing medications, tests or procedures, care coordination (communications with other health care professionals or caregivers) and documentation in the medical record.       Hematology SHort Note  Hematology was consulted for elevated platelets and was seen today. PLT were wnl on admission and have increased in the context of pneumonia and delirium. Likely reactive thromocytosis Ordered Jak2/CalR/MPL panel to rule out clonal thrombocytosis. [place] abd -- shows normal sized spleen with no evidence of splenomegaly. Full consult to follow.  Wyvonnia Lora MD MS

## 2023-01-02 LAB — GLUCOSE, CAPILLARY
Glucose-Capillary: 106 mg/dL — ABNORMAL HIGH (ref 70–99)
Glucose-Capillary: 108 mg/dL — ABNORMAL HIGH (ref 70–99)
Glucose-Capillary: 112 mg/dL — ABNORMAL HIGH (ref 70–99)
Glucose-Capillary: 114 mg/dL — ABNORMAL HIGH (ref 70–99)
Glucose-Capillary: 138 mg/dL — ABNORMAL HIGH (ref 70–99)

## 2023-01-02 MED ORDER — OSMOLITE 1.5 CAL PO LIQD
100.0000 mL | ORAL | Status: DC
  Administered 2023-01-02: 100 mL

## 2023-01-02 NOTE — Hospital Course (Signed)
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    Apparently husband did CPR for 15 minutes per recommendation by EMS though he felt she did not lose pulse.  On arrival to ED, she was unresponsive and intubated and admitted to ICU. Patient was extubated on 8/5 but remained agitated and reintubated.  Hospital course complicated by septic shock due to MSSA pneumonia, polymorphic VT x 2 on 8/7 aborted by AICD shock and prolonged agitation requiring mechanical ventilation and sedation.  Eventually, she came off vasopressors and sedation.  She was extubated on 8/12 and transferred to [facility] on 8/14 on 10 L by [facility],  tube feed via cortrack, and IV Ancef for MSSA pneumonia..    Assessment & Plan:   Metabolic encephalopathy Improved.  Thought to be multifactorial from alcohol withdrawal, seizure, sepsis and ICU delirium   MSSA pneumonia Acute hypoxic respiratory failure Completed antibiotics with cefazolin on 8/15, off oxygen at this time.  Dysphagia  History oftongue cancer and chemoradiation 2015. Seen by speech therapy. On dysphagia 1 diet and nocturnal tube feeds   Polymorphic VT Secondary to hypokalemia hypomagnesemia.  2D echocardiogram with normal EF.  Improved after replacement.  Alcohol withdrawal seizure and Dts Continue Keppra multivitamin folic acid and thiamine.  DTs have resolved.    Hypotension/history of hypertension: TSH and cortisol normal.  On midodrine and low-dose metoprolol for tachycardia and history of VT.  Prior history of stroke:  CT head without acute finding.  Not able to obtain MRI due to AICD.  Continue  Lipitor and aspirin.   Right parietal cyst,  Follows up with neurosurgery as outpatient.   Anxiety and depression: Stable -Continue Lexapro   Hypothyroidism: TSH normal. Continue Synthroid.   Previous tongue cancer s/p chemoradiation in 2015 Recommend outpatient follow-up   Thrombocytosis:  Likely acute reactant from recent infection.  Completed antibiotic course for MSSA pneumonia.    Platelet function assay normal.  Normal spleen on ultrasound.  Previous provider Dr.Gonfa had spoken with hematology/oncology, Dr. Candise Che who had and impression of  acute reactant from recent infection and takes time to improve, Continue low-dose aspirin   Mild leukocytosis: Improved.   Severe protein calorie malnutrition Body mass index is 22.22 kg/m. 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

## 2023-01-02 NOTE — Progress Notes (Signed)
Occupational Therapy Treatment Patient Details Name: AMIYHA BUMPUS MRN: [id number] DOB: 09/27/1955 Today's Date: 01/02/2023   History of present illness Pt is 67 year old presented to [facility] on  12/14/22 for with stroke like symptoms after a seizure. Had been off ETOH due to n/v. Pt with severe sepsis with septic shock MSSA PNA. Intubated on admission. Extubated 8/5. Reintubated 8/6. Extubated 8/12.  PMH - CVA, ICD, depression, HTN, etoh use disorder, tongue CA, RA   OT comments  Pt with continued improvements in cognition, demonstrating improving processing time and problem solving during session today. Pt able to manage ADLs assessed with no more than CGA. Trialed Rollator for mobility though pt reports feeling more stable with RW. Encouraged increased PO intake and setup for breakfast at end of session.       If plan is discharge home, recommend the following:  Assistance with cooking/housework;Direct supervision/assist for medications management;Direct supervision/assist for financial management;Assist for transportation;Help with stairs or ramp for entrance;Supervision due to cognitive status   Equipment Recommendations  Other (comment) (RW`)    Recommendations for Other Services      Precautions / Restrictions Precautions Precautions: Fall Precaution Comments: cortrak Restrictions Weight Bearing Restrictions: No       Mobility Bed Mobility Overal bed mobility: Modified Independent Bed Mobility: Supine to Sit                Transfers Overall transfer level: Needs assistance Equipment used: Rollator (4 wheels), None Transfers: Sit to/from Stand Sit to Stand: Supervision           General transfer comment: standing with rollator, educated on brake mgmt. standing from recliner to pivot to [facility] at end of session w/o AD, able to problem solve turning to avoid cortrak cord tangling     Balance Overall balance assessment: Needs assistance Sitting-balance support: No  upper extremity supported, Feet supported Sitting balance-Leahy Scale: Good     Standing balance support: No upper extremity supported, Bilateral upper extremity supported, During functional activity Standing balance-Leahy Scale: Fair                             ADL either performed or assessed with clinical judgement   ADL Overall ADL's : Needs assistance/impaired Eating/Feeding: Set up;Sitting Eating/Feeding Details (indicate cue type and reason): encouragement to eat breakfast items for protein and hopeful removal of cortrak         Lower Body Bathing: Contact guard assist;Sitting/lateral leans;Sit to/from stand Lower Body Bathing Details (indicate cue type and reason): to bathe peri region after purewick malfunction Upper Body Dressing : Set up;Sitting Upper Body Dressing Details (indicate cue type and reason): new gown Lower Body Dressing: Contact guard assist;Sitting/lateral leans;Sit to/from stand Lower Body Dressing Details (indicate cue type and reason): doffing soiled mesh underwear and donning new underwear Toilet Transfer: Contact guard assist;Stand-pivot;BSC/3in1 Toilet Transfer Details (indicate cue type and reason): at end of session, pt requesting bathroom assist. assisted to [facility] and secretary and NT informed         Functional mobility during ADLs: Minimal assistance;Rollator (4 wheels) General ADL Comments: Trial of Rollator though pt does not feel as steady with, assist with LB ADLs and problem solving after purewick malfunction    Extremity/Trunk Assessment Upper Extremity Assessment Upper Extremity Assessment: Generalized weakness;LUE deficits/detail LUE Deficits / Details: some questionable deficits around elbow, from prior CVA? impaired coordination when reaching for items and lifting this UE LUE Coordination: decreased fine  motor;decreased gross motor   Lower Extremity Assessment Lower Extremity Assessment: Defer to PT evaluation         Vision   Vision Assessment?: No apparent visual deficits Additional Comments: reports some minor blurriness but "i should go to the eye doctor"   Perception     Praxis      Cognition Arousal: Alert Behavior During Therapy: WFL for tasks assessed/performed, Impulsive Overall Cognitive Status: No family/caregiver present to determine baseline cognitive functioning                                 General Comments: minor impulsivity and safety awareness deficits. improving processing with quick wit during session. minor cues for safety and problem solving. able to recall that her spouse does Curator work        Building services engineer Comments      Pertinent Vitals/ Pain       Pain Assessment Pain Assessment: No/denies pain  Home Living                                          Prior Functioning/Environment              Frequency  Min 1X/week        Progress Toward Goals  OT Goals(current goals can now be found in the care plan section)  Progress towards OT goals: Progressing toward goals  Acute Rehab OT Goals Patient Stated Goal: home soon, remove cortrak OT Goal Formulation: With patient Time For Goal Achievement: 01/05/23 Potential to Achieve Goals: Good ADL Goals Pt Will Perform Grooming: with min assist;sitting Pt Will Perform Upper Body Dressing: sitting;with min assist Pt Will Perform Lower Body Dressing: with mod assist;sit to/from stand Pt Will Transfer to Toilet: with mod assist;stand pivot transfer;bedside commode Additional ADL Goal #1: Pt will complete bed mobility with min A as a precursor to ADLs  Plan      Co-evaluation                 AM-PAC OT "6 Clicks" Daily Activity     Outcome Measure   Help from another person eating meals?: None Help from another person taking care of personal grooming?: A Little Help from another person toileting, which includes using  toliet, bedpan, or urinal?: A Little Help from another person bathing (including washing, rinsing, drying)?: A Little Help from another person to put on and taking off regular upper body clothing?: A Little Help from another person to put on and taking off regular lower body clothing?: A Little 6 Click Score: 19    End of Session Equipment Utilized During Treatment: Rollator (4 wheels);Gait belt  OT Visit Diagnosis: Unsteadiness on feet (R26.81);Other abnormalities of gait and mobility (R26.89);Muscle weakness (generalized) (M62.81)   Activity Tolerance Patient tolerated treatment well   Patient Left Other (comment) (on BSC with call bell)   Nurse Communication Other (comment) (discussed with NT)        Time: 4098-1191 OT Time Calculation (min): 21 min  Charges: OT General Charges $OT Visit: 1 Visit OT Treatments $Self Care/Home Management : 8-22 mins  Bradd Canary, OTR/L Acute Rehab Services Office: [phone]   Lorre Munroe 01/02/2023, 10:07 AM

## 2023-01-02 NOTE — Progress Notes (Signed)
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    Apparently husband did CPR for 15 minutes per recommendation by EMS though he felt she did not lose pulse.  On arrival to ED, she was unresponsive and intubated and admitted to ICU. Patient was extubated on 8/5 but remained agitated and reintubated.  Hospital course complicated by septic shock due to MSSA pneumonia, polymorphic VT x 2 on 8/7 aborted by AICD shock and prolonged agitation requiring mechanical ventilation and sedation.  Eventually, she came off vasopressors and sedation.  She was extubated on 8/12 and transferred [facility] on 8/14 on 10 L by HFNC,  tube feed via cortrack, and IV Ancef for MSSA pneumonia..    Assessment & Plan:   Metabolic encephalopathy Improved.  Thought to be multifactorial from alcohol withdrawal, seizure, sepsis and ICU delirium   MSSA pneumonia Acute hypoxic respiratory failure Completed antibiotics with cefazolin on 8/15, off oxygen at this time.  Dysphagia History oftongue cancer and chemoradiation 2015. Seen by speech therapy. On dysphagia 1 diet and nocturnal tube feeds.  Will advance diet as tolerated.  Plan to discontinue feeding tube by a.m. if tolerated.   Polymorphic VT Secondary to hypokalemia hypomagnesemia.  2D echocardiogram with normal EF.  Improved after replacement.  Alcohol withdrawal seizure and Dts Continue Keppra multivitamin folic acid and thiamine.  DTs have resolved.     Hypotension/history of hypertension: TSH and cortisol normal.  On midodrine and low-dose metoprolol for tachycardia and history of VT.  Prior history of stroke:  CT head without acute finding.  Not able to obtain MRI due to AICD.  Continue Lipitor and aspirin.   Right parietal cyst,  Follows up with neurosurgery as outpatient.   Anxiety and depression: Stable -Continue Lexapro   Hypothyroidism: TSH normal. Continue Synthroid.   Previous tongue cancer s/p chemoradiation in 2015 Recommend outpatient follow-up   Thrombocytosis:  Likely acute reactant from recent infection.  Completed antibiotic course for MSSA pneumonia.    Platelet function assay normal.  Normal spleen on ultrasound.  Previous provider Dr.Gonfa had spoken with hematology/oncology, Dr. Candise Che who had and impression of  acute reactant from recent infection and takes time to improve, Continue low-dose aspirin   Mild leukocytosis: Improved.   Severe protein calorie malnutrition Body mass index is 22.22 kg/m. 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 Status is: Inpatient Remains inpatient appropriate because: Encephalopathy, dysphagia on cortrak tube  Family Communication: None at bedside  Consultants:  PCCM Oncology  Procedures:  Cortrak tube tube placement  Antimicrobials:  None currently  Anti-infectives (From admission, onward)    Start     Dose/Rate Route Frequency Ordered Stop   12/20/22 1600  ceFAZolin (ANCEF) IVPB 2g/100 mL premix  2 g 200 mL/hr over 30 Minutes Intravenous Every 8 hours 12/20/22 1517 12/25/22 2341   12/19/22 1700   ceFAZolin (ANCEF) IVPB 2g/100 mL premix  Status:  Discontinued        2 g 200 mL/hr over 30 Minutes Intravenous Every 12 hours 12/19/22 1342 12/20/22 1517   12/17/22 0600  piperacillin-tazobactam (ZOSYN) IVPB 3.375 g  Status:  Discontinued        3.375 g 12.5 mL/hr over 240 Minutes Intravenous Every 8 hours 12/17/22 0449 12/19/22 1342        Subjective: Today, patient was seen and examined at bedside.  Patient feels okay.  Denies any pain, nausea, vomiting, fever or chills.  States that she eats regular food at home.  Currently on cortrak tube   Objective: Vitals:   01/01/23 2326 01/02/23 0343 01/02/23 0729 01/02/23 0741  BP: 106/78 (!) 114/92  112/78  Pulse: 89 93 88 99  Resp: 18  18 18   Temp: 98.3 F (36.8 C) 97.8 F (36.6 C)  98 F (36.7 C)  TempSrc: Oral Oral  Oral  SpO2: 97% 99% 99% 100%  Weight:      Height:        Intake/Output Summary (Last 24 hours) at 01/02/2023 1011 Last data filed at 01/02/2023 1000 Gross per 24 hour  Intake 1226.67 ml  Output 1550 ml  Net -323.33 ml   Filed Weights   12/30/22 0500 12/31/22 0500 01/01/23 0328  Weight: 53 kg 54.9 kg 55.4 kg    Physical Examination: Body mass index is 22.42 kg/m.  General:  Average built, not in obvious distress, cortrak tube tube in place HENT:   No scleral pallor or icterus noted. Oral mucosa is moist.  Chest:  Clear breath sounds.   No crackles or wheezes.  CVS: S1 &S2 heard. No murmur.  Regular rate and rhythm. Abdomen: Soft, nontender, nondistended.  Bowel sounds are heard.   Extremities: No cyanosis, clubbing or edema.  Peripheral pulses are palpable.  Right upper extremity PICC line in place. Psych: Alert, awake and oriented to place person and time.  Confused about month. CNS:  No cranial nerve deficits.  Power equal in all extremities.   Skin: Warm and dry.  No rashes noted.  Data Reviewed:   CBC: Recent Labs  Lab 12/29/22 0550 12/30/22 0500 12/30/22 0630 12/31/22 0546 01/01/23 0545   WBC 12.2* 11.7* 12.4* 9.5 8.2  NEUTROABS 9.3*  --  9.0*  --   --   HGB 11.5* 11.5* 11.4* 10.9* 10.9*  HCT 34.5* 35.1* 34.3* 32.6* 32.6*  MCV 100.6* 102.0* 101.5* 103.2* 103.2*  PLT 885* 957* 968* 946* 898*    Basic Metabolic Panel: Recent Labs  Lab 12/27/22 0427 12/28/22 0320 12/30/22 0500 12/31/22 0546 01/01/23 0545  NA 134* 133* 136 139 135  K 3.8 4.2 4.2 3.9 3.8  CL 101 100 99 100 101  CO2 23 21* 23 27 26   GLUCOSE 88 84 74 91 107*  BUN 22 18 19 18 19   CREATININE 0.94 0.68 0.79 0.74 0.87  CALCIUM 8.7* 8.8* 9.5 9.3 9.0  MG 2.2 2.1 2.1 2.0  --   PHOS 2.9 2.8 4.2 4.0  --     Liver Function Tests: Recent Labs  Lab 12/28/22 0320 12/30/22 0500 12/30/22 0630 12/30/22 1428 12/31/22 0546  AST 39  --  40 33 45*  ALT 25  --  40 35 43  ALKPHOS 182*  --  153* 144* 131*  BILITOT 0.7  --  0.3 0.2* 0.2*  PROT 7.0  --  7.2 6.4* 6.8  ALBUMIN 2.6* 2.7* 2.6* 2.4* 2.5*     Radiology Studies: No results found.    LOS: 19 days    Joycelyn Das, MD [facility] Available via Epic secure chat 7am-7pm After these hours, please refer to coverage provider listed on [email or web address] 01/02/2023, 10:11 AM

## 2023-01-02 NOTE — Progress Notes (Signed)
Speech Language Pathology Treatment: Dysphagia  Patient Details Name: Krystal Peterson MRN: [id number] DOB: 1956/01/23 Today's Date: 01/02/2023 Time: 1120-1140 SLP Time Calculation (min) (ACUTE ONLY): 20 min  Assessment / Plan / Recommendation Clinical Impression  Pts function remains stable. Needed reminders for throat clearing as a strategy with reminder for rationale. Not carrying over without assist, but still stable. Intake is acceptable but pt hasn't really tried new foods arriving on dys 2 tray. Favoring liquids. Pt can continue modified diet or can advance to thin and regular with known risk or aspiration and dysphagia. She has moderate residue with solid and silent aspiration of thin liquids. However this was likely true prior to admit as well. Pt seems to tolerate dysphagia when she is at her functional baseline, so could return to unrestricted diet per MD/patient/family wishes.   HPI HPI: 67 year old lady,  had some nausea and vomiting and diarrhea 12/12/22 followng eating bad mushrooms for few days and therefore quit drinking alcohol which she normally does on a regular basis.  Then husband witnessed seizure by the husband with a left gaze.  Tonic-clonic generalized.   Husband did CPR on advise by EMS for 15 min though he felt she did not lose pulse. Had 1 more episode of seizure on arrival via EMS.  In the ER unresponsive and intubated 8/4-8/5 then 8/6-8/12.   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  Recommendations may be updated based on patient status, additional functional criteria and insurance authorization.    Recommendations  Diet recommendations: Nectar-thick liquid;Dysphagia 2 (fine chop) Liquids provided via: Cup;Straw Medication Administration: Crushed with puree Supervision: Patient able to self feed Compensations: Follow solids with liquid;Clear throat intermittently;Multiple dry swallows after each bite/sip Postural Changes and/or Swallow Maneuvers: Seated upright 90 degrees                              Continue with current plan of care     Krystal Peterson, [name]  01/02/2023, 11:51 AM

## 2023-01-02 NOTE — Progress Notes (Signed)
Physical Therapy Treatment Patient Details Name: Krystal Peterson MRN: [id number] DOB: 09-07-1955 Today's Date: 01/02/2023   History of Present Illness Pt is 67 year old presented to [facility] on  12/14/22 for with stroke like symptoms after a seizure. Had been off ETOH due to n/v. Pt with severe sepsis with septic shock MSSA PNA. Intubated on admission. Extubated 8/5. Reintubated 8/6. Extubated 8/12.  PMH - CVA, ICD, depression, HTN, etoh use disorder, tongue CA, RA    PT Comments  Tolerated treatment well. Gradually improving balance and ambulatory ability with RW for support. Still needs VC for scanning for obstacles to avoid drifting into them. Reviewed LE exercises. Encouraged OOB frequently with staff to maximize functional gains. Patient will continue to benefit from skilled physical therapy services to further improve independence with functional mobility.    If plan is discharge home, recommend the following: Assist for transportation;Direct supervision/assist for medications management;Direct supervision/assist for financial management;Assistance with cooking/housework;A little help with walking and/or transfers;A little help with bathing/dressing/bathroom;Help with stairs or ramp for entrance;Supervision due to cognitive status   Can travel by private vehicle     Yes  Equipment Recommendations  Rolling walker (2 wheels)    Recommendations for Other Services       Precautions / Restrictions Precautions Precautions: Fall Precaution Comments: cortrak Restrictions Weight Bearing Restrictions: No     Mobility  Bed Mobility Overal bed mobility: Modified Independent             General bed mobility comments: No assist to/from EOB. Assisted with cortrak only.    Transfers Overall transfer level: Needs assistance Equipment used: Rollator (4 wheels), None Transfers: Sit to/from Stand Sit to Stand: Supervision           General transfer comment: Supervision for safety  and set-up with RW. Stable with bil UE support. Cues to maintain RW within BOS when approaching to sit.    Ambulation/Gait Ambulation/Gait assistance: Contact guard assist Gait Distance (Feet): 150 Feet Assistive device: Rolling walker (2 wheels) Gait Pattern/deviations: Step-through pattern, Decreased stride length, Drifts right/left Gait velocity: decr Gait velocity interpretation: <1.8 ft/sec, indicate of risk for recurrent falls   General Gait Details: Improved control today, no episodes of scissoring. Minor instability with RW for support. Stil drifting into walls/objects unless cued to scan and identify hazards. Challenged with head turns, target finding, and sign reading in hallway.   Stairs             Wheelchair Mobility     Tilt Bed    Modified Rankin (Stroke Patients Only)       Balance Overall balance assessment: Needs assistance Sitting-balance support: No upper extremity supported, Feet supported Sitting balance-Leahy Scale: Good Sitting balance - Comments: Sits EOB without LOB   Standing balance support: No upper extremity supported, Bilateral upper extremity supported, During functional activity Standing balance-Leahy Scale: Fair                              Cognition Arousal: Alert Behavior During Therapy: WFL for tasks assessed/performed, Impulsive Overall Cognitive Status: No family/caregiver present to determine baseline cognitive functioning                                 General Comments: minor impulsivity and safety awareness deficits. improving processing with quick wit during session. minor cues for safety and problem solving.  Exercises General Exercises - Lower Extremity Ankle Circles/Pumps: AROM, Both, 10 reps, Seated Quad Sets: Strengthening, Both, 15 reps, Seated Long Arc Quad: 10 reps, Seated, Strengthening, Both    General Comments        Pertinent Vitals/Pain Pain Assessment Pain  Assessment: No/denies pain    Home Living                          Prior Function            PT Goals (current goals can now be found in the care plan section) Acute Rehab PT Goals Patient Stated Goal: Feel better PT Goal Formulation: Patient unable to participate in goal setting Time For Goal Achievement: 01/05/23 Potential to Achieve Goals: Good Progress towards PT goals: Progressing toward goals    Frequency    Min 1X/week      PT Plan      Co-evaluation              AM-PAC PT "6 Clicks" Mobility   Outcome Measure  Help needed turning from your back to your side while in a flat bed without using bedrails?: None Help needed moving from lying on your back to sitting on the side of a flat bed without using bedrails?: A Little Help needed moving to and from a bed to a chair (including a wheelchair)?: A Little Help needed standing up from a chair using your arms (e.g., wheelchair or bedside chair)?: A Little Help needed to walk in hospital room?: A Little Help needed climbing 3-5 steps with a railing? : A Little 6 Click Score: 19    End of Session Equipment Utilized During Treatment: Gait belt Activity Tolerance: Patient tolerated treatment well Patient left: with call bell/phone within reach;in bed;with bed alarm set Nurse Communication: Mobility status PT Visit Diagnosis: Unsteadiness on feet (R26.81);Other abnormalities of gait and mobility (R26.89);Muscle weakness (generalized) (M62.81)     Time: 3244-0102 PT Time Calculation (min) (ACUTE ONLY): 13 min  Charges:    $Gait Training: 8-22 mins PT General Charges $$ ACUTE PT VISIT: 1 Visit                     Kathlyn Sacramento, PT, DPT [facility]  Rehabilitation Services Physical Therapist Office: [phone] Website: [email or web address]    Berton Mount 01/02/2023, 11:44 AM

## 2023-01-02 NOTE — Progress Notes (Signed)
Nutrition Follow-up  DOCUMENTATION CODES:   Severe malnutrition in context of chronic illness  INTERVENTION:  Continue nocturnal tube feeds via Cortrak: Osmolite 1.5 at 65 mL/hr x 12 hours from 1800 to 0600 (780 mL per day) 60 mL ProSource TF20 - Daily Provides 1250 kcal, 69 gm protein, and 594 mL free water daily.  Banatrol TF - BID Mighty Shake TID with meals, each supplement provides 330 kcals and 9 grams of protein Magic cup TID with meals, each supplement provides 290 kcal and 9 grams of protein  NUTRITION DIAGNOSIS:   Severe Malnutrition related to chronic illness (squamous cell carcinoma of the tongue s/p radiation, EtOH abuse) as evidenced by severe fat depletion, severe muscle depletion. - Ongoing, being addressed via TF and PO diet   GOAL:   Patient will meet greater than or equal to 90% of their needs - Progressing   MONITOR:   Vent status, Labs, Weight trends, TF tolerance, I & O's  REASON FOR ASSESSMENT:   Consult Enteral/tube feeding initiation and management  ASSESSMENT:   67 y.o. female presented to the ED with seizures. 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 Pt admitted with seizures.  8/04 - admitted, intubated, TF initiated at 20 ml/hr (was not advanced past trickle rate), D10 gtt added due to hypoglycemia 8/05 - extubated 8/06 - intubated, Cortrak placed (tip gastric)  8/12 - extubated 8/15 - s/p MBS, recommendations to continue NPO 8/19 - s/p MBS, diet advanced to dysphagia 1 with nectar-thick liquids; transition to nocturnal TF 8/21 - diet advanced to dysphagia 2 with nectar thick liquids   Pt laying in bed, just worked with PT. Reports that she does not have much of an appetite, but eating some. Discussed continuing nocturnal tube feeds until pt appetite improves and PO intake is better. Denies any nausea or vomiting. Agree with plan.   Tube feeds were still running at time of RD visit  despite order for nocturnal feeds. RD stopped feeds and sent secure chat to RN.   Meal Intake 8/20-8/22: 25-50% x 4 meals  Medications reviewed and include: Colace, Folic acid, MVI, Miralax, Thiamine  Labs reviewed. CBG: 106-119 x 24 hrs   UOP 950 mL + 2 occurrences x 24 hrs   Diet Order:   Diet Order             DIET DYS 2 Room service appropriate? No; Fluid consistency: Nectar Thick  Diet effective now                   EDUCATION NEEDS:   Not appropriate for education at this time  Skin:  Skin Assessment: Skin Integrity Issues: Skin Integrity Issues:: Stage II, Other (Comment) Stage II: coccyx Other: abrasion to left upper flank  Last BM:  8/21 - Type 6  Height:  Ht Readings from Last 1 Encounters:  12/19/22 5' 1.89" (1.572 m)   Weight:  Wt Readings from Last 1 Encounters:  01/01/23 55.4 kg    Ideal Body Weight:  50 kg  BMI:  Body mass index is 22.42 kg/m.  Estimated Nutritional Needs:  Kcal:  1600-1800 Protein:  80-90 grams Fluid:  1.6-1.8 L    Kirby Crigler RD, LDN Clinical Dietitian See [facility] for contact information.

## 2023-01-03 LAB — GLUCOSE, CAPILLARY: Glucose-Capillary: 121 mg/dL — ABNORMAL HIGH (ref 70–99)

## 2023-01-03 MED ORDER — GUAIFENESIN 100 MG/5ML PO LIQD: 15.0000 mL | Freq: Four times a day (QID) | ORAL | 0 refills | Status: AC

## 2023-01-03 MED ORDER — NICOTINE 14 MG/24HR TD PT24: 14.0000 mg | MEDICATED_PATCH | Freq: Every day | TRANSDERMAL | 0 refills | Status: AC

## 2023-01-03 MED ORDER — THIAMINE MONONITRATE 100 MG PO TABS
100.0000 mg | ORAL_TABLET | Freq: Every day | ORAL | Status: DC
  Administered 2023-01-03: 100 mg via ORAL
  Filled 2023-01-03: qty 1

## 2023-01-03 MED ORDER — VITAMIN B-1 100 MG PO TABS: 100.0000 mg | ORAL_TABLET | Freq: Every day | ORAL | 0 refills | Status: AC

## 2023-01-03 MED ORDER — LIDOCAINE 5 % EX PTCH: 2.0000 | MEDICATED_PATCH | CUTANEOUS | 0 refills | Status: AC

## 2023-01-03 MED ORDER — METOPROLOL TARTRATE 25 MG PO TABS: 12.5000 mg | ORAL_TABLET | Freq: Two times a day (BID) | ORAL | 2 refills | Status: AC

## 2023-01-03 MED ORDER — FOLIC ACID 1 MG PO TABS: 1.0000 mg | ORAL_TABLET | Freq: Every day | ORAL | 0 refills | Status: AC

## 2023-01-03 MED ORDER — LEVETIRACETAM 1000 MG PO TABS: 1000.0000 mg | ORAL_TABLET | Freq: Two times a day (BID) | ORAL | 2 refills | Status: AC

## 2023-01-03 MED ORDER — IPRATROPIUM-ALBUTEROL 20-100 MCG/ACT IN AERS: 1.0000 | INHALATION_SPRAY | Freq: Four times a day (QID) | RESPIRATORY_TRACT | 2 refills | Status: AC | PRN

## 2023-01-03 MED ORDER — BANATROL TF EN LIQD
60.0000 mL | Freq: Two times a day (BID) | ENTERAL | Status: DC
  Administered 2023-01-03: 60 mL via ORAL
  Filled 2023-01-03: qty 60

## 2023-01-03 NOTE — TOC Transition Note (Addendum)
Transition of Care [facility]) - CM/SW Discharge Note   Patient Details  Name: Krystal Peterson MRN: [id number] Date of Birth: 11-26-1955  [facility] CM/SW Contact:  Lawerance Sabal, RN Phone Number: 01/03/2023, 9:31 AM   Clinical Narrative:     Confirmed w attending that cortrak will be DC'd prior to DC.  Patient states she would like HH, requested attending to order. She would like RW, I have placed order for RW and requested [employer] to deliver it to the room prior to DC.  I have  made referral to [facility] to cover for [facility] services. They have accepted  Patient states her spouse will provide her ride home   Final next level of care: Home w Home Health Services Barriers to Discharge: No Barriers Identified   Patient Goals and CMS Choice CMS Medicare.gov Compare Post Acute Care list provided to:: Patient Choice offered to / list presented to : Patient  Discharge Placement                         Discharge Plan and Services Additional resources added to the After Visit Summary for                  DME Arranged: Walker rolling DME Agency: [employer] Date DME Agency Contacted: 01/03/23 Time DME Agency Contacted: 0930 Representative spoke with at DME Agency: Vaughan Basta HH Arranged: PT, OT HH Agency: [facility] Date [facility] Agency Contacted: 01/03/23 Time HH Agency Contacted: [phone] Representative spoke with at [facility] Agency: Amy  Social Determinants of Health (SDOH) Interventions SDOH Screenings   Food Insecurity: No Food Insecurity (12/25/2022)  Housing: Low Risk  (12/25/2022)  Transportation Needs: No Transportation Needs (12/25/2022)  Utilities: Not At Risk (12/25/2022)  Tobacco Use: High Risk (12/25/2022)     Readmission Risk Interventions    12/15/2022    3:43 PM  Readmission Risk Prevention Plan  Post Dischage Appt Complete  Medication Screening Complete  Transportation Screening Complete

## 2023-01-03 NOTE — Discharge Summary (Signed)
Physician Discharge Summary  CALEIGH BELLEFEUILLE ZOX:[id number] DOB: Oct 14, 1955 DOA: 12/13/2022  PCP: Montez Hageman, DO  Admit date: 12/13/2022 Discharge date: 01/03/2023  Admitted From: Home  Discharge disposition: Home with home health.  Recommendations for Outpatient Follow-Up:   Follow up with your primary care provider in one week.  Check CBC, BMP, magnesium in the next visit   Discharge Diagnosis:   Principal Problem:   Seizure (HCC) Active Problems:   Protein-calorie malnutrition, severe   Acute respiratory failure with hypoxia (HCC)   Status epilepticus (HCC)   Alcohol withdrawal seizure with complication (HCC)   Thrombocytosis   Discharge Condition: Improved.  Diet recommendation: Mechanical soft diet.  Wound care: None.  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 Apparently husband did CPR for 15 minutes per recommendation by EMS though he felt she did not lose pulse. On arrival to ED, she was unresponsive and intubated and admitted to ICU. Patient was extubated on 8/5 but remained agitated and reintubated. Hospital course complicated by septic shock due to MSSA pneumonia, polymorphic VT x 2 on 8/7 aborted by AICD shock and prolonged agitation requiring mechanical ventilation and sedation. Eventually, she came off vasopressors and sedation. She was extubated on 8/12 and transferred [facility] on 8/14 on 10 L by HFNC, tube feed via cortrack, and IV Ancef for MSSA pneumonia.Marland Kitchen   Hospital Course:   Following conditions were addressed during  hospitalization as listed below,  Metabolic encephalopathy Improved.  Thought to be multifactorial from alcohol withdrawal, seizure, sepsis and ICU delirium.  Currently at baseline.   MSSA pneumonia Acute hypoxic respiratory failure Completed antibiotics with cefazolin on 8/15, off oxygen at this time.  Has improved.   Dysphagia History oftongue cancer and chemoradiation 2015. Seen by speech therapy. [facility] hospitalization patient was on dysphagia 1 diet and nocturnal tube feeds.  Discontinue feeding tube.   Polymorphic VT, history of AICD. Secondary to hypokalemia, hypomagnesemia.  2D echocardiogram with normal EF.  Improved after replacement.  Check electrolytes as outpatient.  Will need to follow-up with cardiology as outpatient.   Alcohol withdrawal seizure and Dts Continue Keppra folic acid and thiamine.  Patient will need to continue Keppra on discharge.   Hypotension/history of hypertension: TSH and cortisol normal.  Was on midodrine and low-dose metoprolol for tachycardia and history of VT. will continue metoprolol on discharge.   Prior history of stroke:  CT head without acute finding.  Not able to obtain MRI due to AICD.  Continue Lipitor and aspirin.   Right parietal cyst,  Follows up with neurosurgery as outpatient.   Anxiety and depression: Stable -Continue Lexapro   Hypothyroidism: TSH normal. Continue Synthroid.   Previous tongue cancer s/p chemoradiation in 2015 Recommend outpatient follow-up with oncology.   Thrombocytosis:  Likely acute reactant from recent infection.  Completed antibiotic course for MSSA pneumonia.    Platelet function assay normal.  Normal spleen on ultrasound.  Previous provider Dr.Gonfa had spoken with hematology/oncology, Dr. Candise Che who had and impression of  acute reactant from recent infection and takes time to improve, Continue low-dose aspirin   Mild leukocytosis: Improved.  Severe protein calorie malnutrition Body mass index is 22.22  kg/m. 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  At this time, patient is stable for disposition home with outpatient PCP and cardiology follow-up.  Medical Consultants:   PCCM Oncology  Procedures:    Cortrak tube placement Subjective:   Today, patient was seen and examined at bedside.  Feels okay.  Denies any shortness of breath chest pain nausea vomiting.  Wants to go home.  Discharge Exam:   Vitals:   01/03/23 0733 01/03/23 0820  BP: 106/72   Pulse: 100 (!) 101  Resp: 15 15  Temp: 98.9 F (37.2 C)   SpO2: 95% 97%   Vitals:   01/02/23 2336 01/03/23 0243 01/03/23 0733 01/03/23 0820  BP: 109/80 120/87 106/72   Pulse: 98 (!) 102 100 (!) 101  Resp: 17 18 15 15   Temp: 97.9 F (36.6 C) 98 F (36.7 C) 98.9 F (37.2 C)   TempSrc:   Oral   SpO2: 96% 99% 95% 97%  Weight:      Height:       Body mass index is 22.42 kg/m.   General: Alert awake, not in obvious distress HENT: pupils equally reacting to light,  No scleral pallor or icterus noted. Oral mucosa is moist.  Chest:   Diminished breath sounds bilaterally. No crackles or wheezes.  CVS: S1 &S2 heard. No murmur.  Regular rate and rhythm. Abdomen: Soft, nontender, nondistended.  Bowel sounds are heard.   Extremities: No cyanosis, clubbing or edema.  Peripheral pulses are palpable. Psych: Alert, awake and oriented, normal mood CNS:  No cranial nerve deficits.  Power equal in all extremities.   Skin: Warm and dry.  No rashes noted.  The results of significant diagnostics from this hospitalization (including imaging, microbiology, ancillary and laboratory) are listed below for reference.     Diagnostic Studies:   DG Abd Portable 1V  Result Date: 12/14/2022 CLINICAL DATA:  Feeding tube placement EXAM: PORTABLE  ABDOMEN - 1 VIEW COMPARISON:  None Available. FINDINGS: Orogastric or nasogastric tube enters the stomach with its tip in the expected location of the mid body of the stomach. Upper abdominal gas pattern is unremarkable. IMPRESSION: Orogastric or nasogastric tube tip in the mid body of the stomach. Electronically Signed   By: Paulina Fusi M.D.   On: 12/14/2022 10:04   Overnight EEG with video  Result Date: 12/14/2022 Windell Norfolk, MD     12/14/2022  7:56 AM Patient Name: NIAMH PINELA MRN: [id number] Epilepsy Attending: Windell Norfolk Referring Physician/Provider: Dr. Derry Lory Date: 12/14/2022 Start time: 8/4 at 0205 End Time: 8/4 at 0730 Duration: 5 hours and 25 minutes Patient history: 16 year with history of stroke, who is presenting with AMS after a seizure, EEG for further evaluation. Level of alertness: Intubated and sedated AEDs during EEG study: Levetiracetam Technical aspects: This EEG study was done with scalp electrodes positioned according to the 10-20 International system of electrode placement. Electrical activity was reviewed with band pass filter of 1-70Hz , sensitivity of 7 uV/mm, display speed of 20mm/sec with a 60Hz  notched filter applied as appropriate. EEG data were recorded continuously and digitally stored.  Video monitoring was available and reviewed as appropriate. Description: EEG showed continuous generalized polymorphic sharply contoured 3 to 6 Hz theta-delta slowing. The EEG background was continuous and reactive. There were additional right temporal  sharps, Sleep pattern was not seen.  Hyperventilation and photic stimulation were not performed.   ABNORMALITY -Sharp wave, right temporal region. -Continuous slow, generalized IMPRESSION: This study showed evidence of potential epileptogenicity arising from the right temporal region and moderate diffuse encephalopathy, nonspecific etiology but likely related to sedation, toxic-metabolic etiology, anoxic/hypoxic brain injury Amadou  Camara   CT CHEST WO CONTRAST  Result Date: 12/14/2022 CLINICAL DATA:  Respiratory illness this with no prior imaging and abnormal x-ray. EXAM: CT CHEST WITHOUT CONTRAST TECHNIQUE: Multidetector CT imaging of the chest was performed following the standard protocol without IV contrast. RADIATION DOSE REDUCTION: This exam was performed according to the departmental dose-optimization program which includes automated exposure control, adjustment of the mA and/or kV according to patient size and/or use of iterative reconstruction technique. COMPARISON:  Chest radiograph from earlier today. FINDINGS: Cardiovascular: Normal heart size. No pericardial effusion. ICD lead into the right ventricle from the left. No acute vascular finding. Scattered atheromatous calcification. Mediastinum/Nodes: Negative for mass or adenopathy. An enteric tube at least reaches the stomach. Lungs/Pleura: An endotracheal tube tip terminates above the carina and below the thoracic inlet. Mild apical emphysema. There is no edema, consolidation, effusion, or pneumothorax. Upper Abdomen: Excreting contrast in the kidneys from recent CTA of the head and neck. Few colonic fluid levels are seen at the splenic flexure. Musculoskeletal: Anterior right second, fourth, and fifth rib fractures with mild displacement of the second rib. Angulation of anterior left third through sixth ribs from nondisplaced fracturing. Chronic T1 compression fracture. No acute spinal fracture or subluxation. IMPRESSION: 1. No evidence of acute cardiopulmonary disease. 2. Bilateral anterior rib fractures with up to mild displacement. No air leak. Electronically Signed   By: Tiburcio Pea M.D.   On: 12/14/2022 05:18   US Abdomen Limited RUQ (LIVER/GB)  Result Date: 12/14/2022 CLINICAL DATA:  Alcoholic. EXAM: ULTRASOUND ABDOMEN LIMITED RIGHT UPPER QUADRANT COMPARISON:  None Available. FINDINGS: Gallbladder: No gallstones or wall thickening visualized. Sonographic Eulah Pont  sign could not be assessed. Common bile duct: Diameter: 2.3 mm Liver: No focal lesion identified. The left lobe of the liver could not be visualized due to overlying defibrillator pad. Within normal limits in parenchymal echogenicity. Portal vein is patent on color Doppler imaging with normal direction of blood flow towards the liver. Other: No free fluid. IMPRESSION: 1. No cholelithiasis or acute cholecystitis. 2. Limited evaluation of the left lobe of the liver. No acute abnormality is seen. Electronically Signed   By: Thornell Sartorius M.D.   On: 12/14/2022 03:56   CT ANGIO HEAD NECK W WO CM W PERF (CODE STROKE)  Result Date: 12/14/2022 CLINICAL DATA:  Initial evaluation for neuro deficit, stroke. EXAM: CT ANGIOGRAPHY HEAD AND NECK CT PERFUSION BRAIN TECHNIQUE: Multidetector CT imaging of the head and neck was performed using the standard protocol during bolus administration of intravenous contrast. Multiplanar CT image reconstructions and MIPs were obtained to evaluate the vascular anatomy. Carotid stenosis measurements (when applicable) are obtained utilizing NASCET criteria, using the distal internal carotid diameter as the denominator. Multiphase CT imaging of the brain was performed following IV bolus contrast injection. Subsequent parametric perfusion maps were calculated using RAPID software. RADIATION DOSE REDUCTION: This exam was performed according to the departmental dose-optimization program which includes automated exposure control, adjustment of the mA and/or kV according to patient size and/or use of iterative reconstruction technique. CONTRAST:  OMNIPAQUE IOHEXOL 350 MG/ML SOLN COMPARISON:  CT from earlier the same day as well as prior exam from  05/28/2022. FINDINGS: CTA NECK FINDINGS Aortic arch: Visualized aortic arch normal caliber standard branch pattern. Atheromatous change about the arch itself. No significant stenosis about the origin the great vessels. Right carotid system: Right  common and internal carotid arteries are patent without stenosis or dissection. Left carotid system: Left CCA patent to the bifurcation. 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 Left ICA remains occluded to the skull base. Vertebral arteries: Both vertebral arteries arise from the subclavian arteries. No significant proximal subclavian artery stenosis. Moderate atheromatous stenosis at the origin of the left vertebral artery which is dominant. Vertebral arteries otherwise patent distally without stenosis or dissection. Skeleton: No discrete or worrisome osseous lesions. Chronic compression deformity involving the T1 vertebral body noted, stable. Patient is edentulous. Other neck: Endotracheal tube in place.  No other acute finding. Upper chest: Emphysema. Left-sided pacemaker/AICD in place. No other acute finding. Review of the MIP images confirms the above findings CTA HEAD FINDINGS Anterior circulation: Right ICA widely patent through the siphon without stenosis. There is distal reconstitution of the left ICA at the left carotid siphon via the ophthalmic artery and flow across the circle-of-Willis. A1 segments patent bilaterally. Normal anterior communicating complex. Anterior cerebral arteries patent without stenosis. No M1 stenosis or occlusion. No proximal MCA branch occlusion or high-grade stenosis. Distal MCA branches perfused and symmetric. Posterior circulation: Both V4 segments widely patent. Left PICA patent. Right PICA not both seen. Basilar patent without stenosis. Superior cerebellar arteries patent bilaterally. Right PCA widely patent to its distal aspect without stenosis. 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 Venous sinuses: Grossly patent allowing for timing of the contrast bolus. Anatomic variants: As above.  No aneurysm. Review of the MIP images confirms the above findings CT Brain Perfusion Findings: ASPECTS: 10 CBF (<30%)  Volume: 26mL Perfusion (Tmax>6.0s) volume: 74mL Mismatch Volume: 48mL Infarction Location:Apparent 26 mL area of C BF less than 30% largely corresponds with the right occipital rack noise cyst and overlying occipital convexities, favored to be artifactual. Similarly, 74 mL apparent perfusion abnormality at least in part reflects artifact as well. Some delayed perfusion within the left cerebral hemisphere related to the chronically occluded left ICA and PCAs is likely contributory. No convincing acute ischemic changes. IMPRESSION: CTA HEAD AND NECK: 1. Negative CTA for large vessel occlusion or other emergent finding. 2. 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 Distal reconstitution at the left carotid siphon via the ophthalmic artery and flow across the circle-of-Willis. 3. Chronic occlusion of the left PCA at the proximal left P2 segment, stable. 4. Moderate atheromatous stenosis at the origin of the dominant left vertebral artery. CT PERFUSION: Negative CT perfusion. Apparent perfusion deficits are felt to be consistent with artifact and/or the chronic left ICA/PCA occlusions as discussed above. Aortic Atherosclerosis (ICD10-I70.0) and Emphysema (ICD10-J43.9). Case discussed by telephone around the time of interpretation on 12/14/2022 at 12:25 a.m. to provider [facility]. Electronically Signed   By: Rise Mu M.D.   On: 12/14/2022 00:47   DG Chest Port 1 View  Result Date: 12/14/2022 CLINICAL DATA:  ET tube EXAM: PORTABLE CHEST 1 VIEW COMPARISON:  05/28/2022 FINDINGS: Endotracheal tube is 5 cm above the carina. NG tube enters the stomach. Left pacer remains in place, unchanged. Heart and mediastinal contours within normal limits. No confluent opacities or effusions. No acute bony abnormality. IMPRESSION: Support devices in expected position as above. No acute cardiopulmonary disease.  Electronically Signed   By: Charlett Nose M.D.   On: 12/14/2022 00:29   CT HEAD  CODE STROKE WO CONTRAST  Result Date: 12/14/2022 CLINICAL DATA:  Code stroke. EXAM: CT HEAD WITHOUT CONTRAST TECHNIQUE: Contiguous axial images were obtained from the base of the skull through the vertex without intravenous contrast. RADIATION DOSE REDUCTION: This exam was performed according to the departmental dose-optimization program which includes automated exposure control, adjustment of the mA and/or kV according to patient size and/or use of iterative reconstruction technique. COMPARISON:  Prior study from 06/26/2022. FINDINGS: Brain: Cerebral volume within normal limits for age. Mild chronic small vessel ischemic disease noted. Chronic left PCA distribution infarct. Benign arachnoid cyst at the posterior right occipital region. No acute intracranial hemorrhage. No acute large vessel territory infarct. No mass lesion or midline shift. No hydrocephalus or extra-axial fluid collection. Vascular: No abnormal hyperdense vessel. Skull: Scalp soft tissues within normal limits.  Calvarium intact. Sinuses/Orbits: Globes and orbital soft tissues within normal limits. Mild pneumatized secretions within the posterior right ethmoidal air cells. Paranasal sinuses are otherwise clear. No significant mastoid effusion. Other: Endotracheal tube in place. ASPECTS [facility] Stroke Program Early CT Score) - Ganglionic level infarction (caudate, lentiform nuclei, internal capsule, insula, M1-M3 cortex): 7 - Supraganglionic infarction (M4-M6 cortex): 3 Total score (0-10 with 10 being normal): 10 IMPRESSION: 1. No acute intracranial abnormality. 2. Aspects is 10. 3. Mild chronic microvascular ischemic disease with chronic left PCA distribution infarct. 4. Posterior right cerebral arachnoid cyst, stable. These results were communicated to Dr. Derry Lory at 12:07 am on 12/14/2022 by text page via the [facility] messaging system. Electronically Signed   By: Rise Mu M.D.   On: 12/14/2022 00:08     Labs:   Basic Metabolic  Panel: Recent Labs  Lab 12/28/22 0320 12/30/22 0500 12/31/22 0546 01/01/23 0545  NA 133* 136 139 135  K 4.2 4.2 3.9 3.8  CL 100 99 100 101  CO2 21* 23 27 26   GLUCOSE 84 74 91 107*  BUN 18 19 18 19   CREATININE 0.68 0.79 0.74 0.87  CALCIUM 8.8* 9.5 9.3 9.0  MG 2.1 2.1 2.0  --   PHOS 2.8 4.2 4.0  --    GFR Estimated Creatinine Clearance: 49.3 mL/min (by C-G formula based on SCr of 0.87 mg/dL). Liver Function Tests: Recent Labs  Lab 12/28/22 0320 12/30/22 0500 12/30/22 0630 12/30/22 1428 12/31/22 0546  AST 39  --  40 33 45*  ALT 25  --  40 35 43  ALKPHOS 182*  --  153* 144* 131*  BILITOT 0.7  --  0.3 0.2* 0.2*  PROT 7.0  --  7.2 6.4* 6.8  ALBUMIN 2.6* 2.7* 2.6* 2.4* 2.5*   No results for input(s): "LIPASE", "AMYLASE" in the last 168 hours. No results for input(s): "AMMONIA" in the last 168 hours. Coagulation profile No results for input(s): "INR", "PROTIME" in the last 168 hours.  CBC: Recent Labs  Lab 12/29/22 0550 12/30/22 0500 12/30/22 0630 12/31/22 0546 01/01/23 0545  WBC 12.2* 11.7* 12.4* 9.5 8.2  NEUTROABS 9.3*  --  9.0*  --   --   HGB 11.5* 11.5* 11.4* 10.9* 10.9*  HCT 34.5* 35.1* 34.3* 32.6* 32.6*  MCV 100.6* 102.0* 101.5* 103.2* 103.2*  PLT 885* 957* 968* 946* 898*   Cardiac Enzymes: No results for input(s): "CKTOTAL", "CKMB", "CKMBINDEX", "TROPONINI" in the last 168 hours. BNP: Invalid input(s): "POCBNP" CBG: Recent Labs  Lab 01/02/23 0608 01/02/23 1225 01/02/23 1810 01/02/23 2336 01/03/23  0624  GLUCAP 112* 108* 138* 114* 121*   D-Dimer No results for input(s): "DDIMER" in the last 72 hours. Hgb A1c No results for input(s): "HGBA1C" in the last 72 hours. Lipid Profile No results for input(s): "CHOL", "HDL", "LDLCALC", "TRIG", "CHOLHDL", "LDLDIRECT" in the last 72 hours. Thyroid function studies No results for input(s): "TSH", "T4TOTAL", "T3FREE", "THYROIDAB" in the last 72 hours.  Invalid input(s): "FREET3" Anemia work up No  results for input(s): "VITAMINB12", "FOLATE", "FERRITIN", "TIBC", "IRON", "RETICCTPCT" in the last 72 hours. Microbiology No results found for this or any previous visit (from the past 240 hour(s)).   Discharge Instructions:   Discharge Instructions     Call MD for:  severe uncontrolled pain   Complete by: As directed    Call MD for:  temperature >100.4   Complete by: As directed    Diet general   Complete by: As directed    Discharge instructions   Complete by: As directed    Follow-up with your primary care provider in 1 week.  Check blood work at that time.  Follow-up with your cardiologist as outpatient for your defibrillator battery issues.   Increase activity slowly   Complete by: As directed    No wound care   Complete by: As directed       Allergies as of 01/03/2023   No Known Allergies      Medication List     TAKE these medications    aspirin EC 81 MG tablet Take 81 mg by mouth daily. Swallow whole.   atorvastatin 40 MG tablet Commonly known as: LIPITOR Take 40 mg by mouth every evening.   azelastine 0.05 % ophthalmic solution Commonly known as: OPTIVAR Place 1 drop into both eyes 2 (two) times daily as needed (allergies).   escitalopram 20 MG tablet Commonly known as: LEXAPRO Take 1 tablet by mouth every evening.   folic acid 1 MG tablet Commonly known as: FOLVITE Take 1 tablet (1 mg total) by mouth daily.   guaiFENesin 100 MG/5ML liquid Commonly known as: ROBITUSSIN Take 15 mLs by mouth every 6 (six) hours.   Ipratropium-Albuterol 20-100 MCG/ACT Aers respimat Commonly known as: COMBIVENT Inhale 1 puff into the lungs every 6 (six) hours as needed for wheezing or shortness of breath.   levETIRAcetam 1000 MG tablet Commonly known as: KEPPRA Take 1 tablet (1,000 mg total) by mouth 2 (two) times daily.   levothyroxine 100 MCG tablet Commonly known as: SYNTHROID Take 100 mcg by mouth daily before breakfast.   lidocaine 5 % Commonly known as:  LIDODERM Place 2 patches onto the skin daily. Remove & Discard patch within 12 hours or as directed by MD   metoprolol tartrate 25 MG tablet Commonly known as: LOPRESSOR Take 0.5 tablets (12.5 mg total) by mouth 2 (two) times daily.   nicotine 14 mg/24hr patch Commonly known as: NICODERM CQ - dosed in mg/24 hours Place 1 patch (14 mg total) onto the skin daily.   thiamine 100 MG tablet Commonly known as: Vitamin B-1 Take 1 tablet (100 mg total) by mouth daily. Start taking on: January 04, 2023               Durable Medical Equipment  (From admission, onward)           Start     Ordered   01/03/23 7628  For home use only DME Walker rolling  Once       Question Answer Comment  Walker: With 5 Inch  Wheels   Patient needs a walker to treat with the following condition Weakness      01/03/23 0927            Follow-up Information     [facility] Follow up.   Contact information: [address] [place] [address] [phone]         Montez Hageman, DO Follow up.   Specialty: Family Medicine Contact information: [address] [place] [address] [phone]                  Time coordinating discharge: 39 minutes  Signed:  Caedon Bond  [facility] 01/03/2023, 11:26 AM

## 2023-01-08 LAB — JAK2 V617F RFX CALR/MPL/E12-15

## 2023-01-08 LAB — CALR +MPL + E12-E15  (REFLEX)

## 2023-07-03 ENCOUNTER — Inpatient Hospital Stay (HOSPITAL_COMMUNITY): Admit: 2023-07-03 | Payer: Medicare Other | Admitting: Pulmonary Disease

## 2023-07-03 ENCOUNTER — Encounter (HOSPITAL_COMMUNITY): Payer: Self-pay
